# Patient Record
Sex: Female | Born: 1935 | Race: White | Hispanic: No | Smoking: Never smoker
Health system: Southern US, Community
[De-identification: ages and names within clinical notes are randomized; demographics above are authoritative.]

## PROBLEM LIST (undated history)

## (undated) DIAGNOSIS — K5900 Constipation, unspecified: Secondary | ICD-10-CM

## (undated) DIAGNOSIS — R1310 Dysphagia, unspecified: Secondary | ICD-10-CM

## (undated) DIAGNOSIS — D649 Anemia, unspecified: Secondary | ICD-10-CM

## (undated) DIAGNOSIS — K222 Esophageal obstruction: Secondary | ICD-10-CM

## (undated) DIAGNOSIS — K219 Gastro-esophageal reflux disease without esophagitis: Secondary | ICD-10-CM

## (undated) DIAGNOSIS — M1990 Unspecified osteoarthritis, unspecified site: Secondary | ICD-10-CM

## (undated) HISTORY — PX: APPENDECTOMY: SHX54

## (undated) HISTORY — DX: Esophageal obstruction: K22.2

## (undated) HISTORY — DX: Gastro-esophageal reflux disease without esophagitis: K21.9

## (undated) HISTORY — PX: OTHER SURGICAL HISTORY: SHX169

## (undated) HISTORY — PX: COLONOSCOPY: SHX174

## (undated) HISTORY — PX: CHOLECYSTECTOMY: SHX55

## (undated) HISTORY — DX: Constipation, unspecified: K59.00

## (undated) HISTORY — PX: ABDOMINAL HYSTERECTOMY: SHX81

## (undated) HISTORY — DX: Dysphagia, unspecified: R13.10

## (undated) SURGERY — EGD (ESOPHAGOGASTRODUODENOSCOPY)
Anesthesia: Moderate Sedation

---

## 2004-08-12 ENCOUNTER — Ambulatory Visit: Payer: Self-pay | Admitting: Internal Medicine

## 2004-08-12 ENCOUNTER — Ambulatory Visit (HOSPITAL_COMMUNITY): Admission: RE | Admit: 2004-08-12 | Discharge: 2004-08-12 | Payer: Self-pay | Admitting: Internal Medicine

## 2004-12-13 ENCOUNTER — Ambulatory Visit: Payer: Self-pay | Admitting: Internal Medicine

## 2005-08-18 ENCOUNTER — Ambulatory Visit: Payer: Self-pay | Admitting: Internal Medicine

## 2006-09-07 ENCOUNTER — Ambulatory Visit: Payer: Self-pay | Admitting: Internal Medicine

## 2010-04-29 ENCOUNTER — Ambulatory Visit: Payer: Self-pay | Admitting: Internal Medicine

## 2010-04-29 ENCOUNTER — Ambulatory Visit (HOSPITAL_COMMUNITY)
Admission: RE | Admit: 2010-04-29 | Discharge: 2010-04-29 | Payer: Self-pay | Source: Home / Self Care | Attending: Internal Medicine | Admitting: Internal Medicine

## 2010-07-19 ENCOUNTER — Ambulatory Visit (INDEPENDENT_AMBULATORY_CARE_PROVIDER_SITE_OTHER): Payer: Self-pay | Admitting: Internal Medicine

## 2010-10-08 NOTE — Op Note (Signed)
NAME:  Valerie Griffin, Valerie Griffin              ACCOUNT NO.:  [id number]   MEDICAL RECORD NO.:  [id number]          PATIENT TYPE:  AMB   LOCATION:  DAY                           FACILITY:  [facility]   PHYSICIAN:  Lionel December, M.D.    DATE OF BIRTH:  05/21/1934   DATE OF PROCEDURE:  08/12/2004  DATE OF DISCHARGE:                                 OPERATIVE REPORT   PROCEDURE:  Esophagogastroduodenoscopy with esophageal dilation.   PROCEDURE:  Esophagogastroduodenoscopy.   INDICATIONS:  Valerie Griffin is a 77 year old Caucasian female who presented in  July 2004 with GI bleeding.  Colonoscopy was normal.  She had EGD by Dr.  Loura Back.  It revealed esophageal ulcers.  At that time she had been on  Fosamax.  Biopsy from the distal esophagus revealed Barrett's mucosa.  She  was treated with PPI, and she returned for a repeat EGD in April 2005 by  him.  This time the noted to minimal esophagitis, small sliding hiatal  hernia, but no definite Barrett's was seen.  Biopsies from distal esophagus  this time were negative for Barrett's.  He therefore referred the patient  for further evaluation.  She is on Prevacid along with antireflux measures, and she has rare or  infrequent heartburn.  She denies dysphagia or melena.  The procedure risks  were reviewed the patient and informed consent was obtained.   PREMEDICATION:  Cetacaine spray for pharyngeal topical anesthesia, Demerol  50 mg IV, Versed 5 mg IV.   FINDINGS:  Procedure performed in endoscopy suite.  The patient's vital  signs and O2 saturation were monitored during procedure and remained stable.  The patient was placed in the left lateral position and Olympus video scope  was passed via oropharynx without any difficulty into esophagus.   Esophagus:  Mucosa of the proximal and middle segment was normal.  Distally  she had two linear erosions close to GE junction.  The GE junction was  located at 35.  She had asymmetric large patch of Barrett's mucosa which  was  posteriorly extending for about 15 mm.  It did not involve the circumference  completely.  There was an ulcer at the junction of Barrett's and squamous  mucosa at 2 o'clock.  This was a few millimeters.  Hiatus was at 40 cm.   Stomach:  It was empty and distended very well with insufflation.  Folds of  the proximal stomach were normal.  Examination of mucosa at body, antrum,  pyloric channel as well as angularis, fundus and cardia was normal.  Hernia  was easily seen on this view.   Duodenum:  Bulbar mucosa was normal.  The scope was passed second part of  duodenum, where mucosa and folds were normal.   Endoscope was pulled back into the esophagus.  Biopsy was taken from this  ulcer and from the Barrett's mucosa and submitted in two separate  containers.  Endoscope was withdrawn.  The patient tolerated the procedure  well.   FINAL DIAGNOSES:  1.  Erosive/ulcerative esophagitis.  She has a single ulcer at junction of      Barrett's  and squamous epithelium.  Short-segment Barrett's esophagus      with asymmetric involvement of distal esophagus.  The maximal length is      about 15 mm.  2.  Small to moderate size sliding hiatal hernia.  3.  Normal exam of the stomach, first and second part of the duodenum.   RECOMMENDATIONS:  1.  She will continue entire reflux measures.  2.  Will increase Prevacid 30 mg p.o. b.i.d., new prescription written for      one month with three refills.  3.  I will be contacting the patient with biopsy results. She will be      returning for OV in four months from now.      NR/MEDQ  D:  08/12/2004  T:  08/12/2004  Job:  [id number]   cc:   Doreen Beam  [address]  [place]  [place] [address]  Fax: [phone]   Loura Back, M.D.  [place], [place]

## 2010-12-20 ENCOUNTER — Other Ambulatory Visit (INDEPENDENT_AMBULATORY_CARE_PROVIDER_SITE_OTHER): Payer: Self-pay | Admitting: *Deleted

## 2010-12-20 DIAGNOSIS — K219 Gastro-esophageal reflux disease without esophagitis: Secondary | ICD-10-CM

## 2010-12-20 NOTE — Telephone Encounter (Signed)
Refill request via fax

## 2010-12-21 MED ORDER — LANSOPRAZOLE 30 MG PO CPDR: DELAYED_RELEASE_CAPSULE | ORAL | Status: DC

## 2016-06-28 ENCOUNTER — Encounter: Payer: Self-pay | Admitting: Internal Medicine

## 2016-07-27 ENCOUNTER — Encounter (INDEPENDENT_AMBULATORY_CARE_PROVIDER_SITE_OTHER): Payer: Self-pay | Admitting: Internal Medicine

## 2016-07-27 ENCOUNTER — Encounter (INDEPENDENT_AMBULATORY_CARE_PROVIDER_SITE_OTHER): Payer: Self-pay

## 2016-08-10 ENCOUNTER — Ambulatory Visit (INDEPENDENT_AMBULATORY_CARE_PROVIDER_SITE_OTHER): Payer: Self-pay | Admitting: Internal Medicine

## 2016-08-16 ENCOUNTER — Encounter (INDEPENDENT_AMBULATORY_CARE_PROVIDER_SITE_OTHER): Payer: Self-pay | Admitting: *Deleted

## 2016-08-16 ENCOUNTER — Ambulatory Visit (INDEPENDENT_AMBULATORY_CARE_PROVIDER_SITE_OTHER): Payer: Medicare HMO | Admitting: Internal Medicine

## 2016-08-16 ENCOUNTER — Encounter (INDEPENDENT_AMBULATORY_CARE_PROVIDER_SITE_OTHER): Payer: Self-pay | Admitting: Internal Medicine

## 2016-08-16 DIAGNOSIS — R1319 Other dysphagia: Secondary | ICD-10-CM

## 2016-08-16 DIAGNOSIS — R1310 Dysphagia, unspecified: Secondary | ICD-10-CM | POA: Diagnosis not present

## 2016-08-16 DIAGNOSIS — K219 Gastro-esophageal reflux disease without esophagitis: Secondary | ICD-10-CM

## 2016-08-16 DIAGNOSIS — K5900 Constipation, unspecified: Secondary | ICD-10-CM | POA: Insufficient documentation

## 2016-08-16 HISTORY — DX: Dysphagia, unspecified: R13.10

## 2016-08-16 NOTE — Progress Notes (Signed)
   Subjective:    Patient ID: Valerie Griffin, female    DOB: 27-Feb-1934, 83 y.o.   MRN: [id number]  HPI  Referred by Dr. Woody Seller for dysphagia. Hx of same and has undergone multiple EGD/ED in the past. Last EGDwas in July of 2017 by Dr. Britta Mccreedy.  Hx of esophageal stricture.  She tells me she is still having trouble. Dysphagia occurs usually on a daily basis. Anything with texture does not want to go down. Chicken and beef will lodge.  Acid reflux is controlled with Prevacid. She has a BM once a day with a stool softener.  September 2017: EGD/ED Short stricture in the distal esophagus. Dilated up to 65mm.  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 Duodenal and stomach mucosa appeared normal.  December 09, 2015 Dr. Britta Mccreedy: Class D esophagitis and an esophageal stricture. Biopsies were consistent with ulcerative esophagitis. .  December 2015 Dr. Britta Mccreedy: dysphagia: LA C esophagitis and a short stricture of the distal esophagus along with a hiatal hernia.  Review of Systems Past Medical History:  Diagnosis Date  . Dysphagia 08/16/2016    No past surgical history on file.  Allergies  Allergen Reactions  . Penicillins     Rash, hot feeling    Current Outpatient Prescriptions on File Prior to Visit  Medication Sig Dispense Refill  . lansoprazole (PREVACID) 30 MG capsule Take One Capsule by Mouth Twice a Day 60 capsule 11   No current facility-administered medications on file prior to visit.        Objective:   Physical Exam Blood pressure (!) 150/66, pulse 72, height 5\' 5"  (1.651 m), weight 181 lb 8 oz (82.3 kg).  Alert and oriented. Skin warm and dry. Oral mucosa is moist.   . Sclera anicteric, conjunctivae is pink. Thyroid not enlarged. No cervical lymphadenopathy. Lungs clear. Heart regular rate and rhythm.  Abdomen is soft. Bowel sounds are positive. No hepatomegaly. No abdominal masses felt. No tenderness.  No edema to lower extremities.        Assessment & Plan:    Dysphagia. Am going to get an esophagram.  Further recommendations ot follow.

## 2016-08-16 NOTE — Patient Instructions (Signed)
DG esophagram.   

## 2016-08-18 ENCOUNTER — Encounter (HOSPITAL_COMMUNITY): Payer: Self-pay

## 2016-08-18 ENCOUNTER — Ambulatory Visit (HOSPITAL_COMMUNITY)
Admission: RE | Admit: 2016-08-18 | Discharge: 2016-08-18 | Disposition: A | Discharge status: Home / Self Care | Payer: Medicare HMO | Source: Ambulatory Visit | Attending: Internal Medicine | Admitting: Internal Medicine

## 2016-08-18 ENCOUNTER — Other Ambulatory Visit (INDEPENDENT_AMBULATORY_CARE_PROVIDER_SITE_OTHER): Payer: Self-pay | Admitting: Internal Medicine

## 2016-08-18 ENCOUNTER — Telehealth (INDEPENDENT_AMBULATORY_CARE_PROVIDER_SITE_OTHER): Payer: Self-pay | Admitting: Internal Medicine

## 2016-08-18 DIAGNOSIS — R1310 Dysphagia, unspecified: Secondary | ICD-10-CM | POA: Diagnosis present

## 2016-08-18 DIAGNOSIS — R933 Abnormal findings on diagnostic imaging of other parts of digestive tract: Secondary | ICD-10-CM | POA: Insufficient documentation

## 2016-08-18 DIAGNOSIS — K222 Esophageal obstruction: Secondary | ICD-10-CM | POA: Insufficient documentation

## 2016-08-18 DIAGNOSIS — R1319 Other dysphagia: Secondary | ICD-10-CM

## 2016-08-18 DIAGNOSIS — K225 Diverticulum of esophagus, acquired: Secondary | ICD-10-CM | POA: Diagnosis not present

## 2016-08-18 DIAGNOSIS — K449 Diaphragmatic hernia without obstruction or gangrene: Secondary | ICD-10-CM | POA: Diagnosis not present

## 2016-08-18 NOTE — Telephone Encounter (Signed)
EGD/ED sch'd 08/22/16, patient aware, verbal instructions given

## 2016-08-18 NOTE — Telephone Encounter (Signed)
EGD/ED

## 2016-08-22 ENCOUNTER — Encounter (HOSPITAL_COMMUNITY)
Admission: RE | Disposition: A | Discharge status: Home / Self Care | Payer: Self-pay | Source: Ambulatory Visit | Attending: Internal Medicine

## 2016-08-22 ENCOUNTER — Ambulatory Visit (HOSPITAL_COMMUNITY)
Admission: RE | Admit: 2016-08-22 | Discharge: 2016-08-22 | Disposition: A | Discharge status: Home / Self Care | Payer: Medicare HMO | Source: Ambulatory Visit | Attending: Internal Medicine | Admitting: Internal Medicine

## 2016-08-22 ENCOUNTER — Encounter (HOSPITAL_COMMUNITY): Payer: Self-pay

## 2016-08-22 DIAGNOSIS — K449 Diaphragmatic hernia without obstruction or gangrene: Secondary | ICD-10-CM | POA: Diagnosis not present

## 2016-08-22 DIAGNOSIS — K222 Esophageal obstruction: Secondary | ICD-10-CM | POA: Diagnosis not present

## 2016-08-22 DIAGNOSIS — K219 Gastro-esophageal reflux disease without esophagitis: Secondary | ICD-10-CM | POA: Diagnosis not present

## 2016-08-22 DIAGNOSIS — R1310 Dysphagia, unspecified: Secondary | ICD-10-CM | POA: Diagnosis present

## 2016-08-22 DIAGNOSIS — K225 Diverticulum of esophagus, acquired: Secondary | ICD-10-CM | POA: Insufficient documentation

## 2016-08-22 DIAGNOSIS — Z79899 Other long term (current) drug therapy: Secondary | ICD-10-CM | POA: Diagnosis not present

## 2016-08-22 DIAGNOSIS — R1319 Other dysphagia: Secondary | ICD-10-CM | POA: Insufficient documentation

## 2016-08-22 DIAGNOSIS — Z9049 Acquired absence of other specified parts of digestive tract: Secondary | ICD-10-CM | POA: Diagnosis not present

## 2016-08-22 DIAGNOSIS — Z7982 Long term (current) use of aspirin: Secondary | ICD-10-CM | POA: Insufficient documentation

## 2016-08-22 DIAGNOSIS — K2210 Ulcer of esophagus without bleeding: Secondary | ICD-10-CM | POA: Insufficient documentation

## 2016-08-22 DIAGNOSIS — R933 Abnormal findings on diagnostic imaging of other parts of digestive tract: Secondary | ICD-10-CM

## 2016-08-22 DIAGNOSIS — Q396 Congenital diverticulum of esophagus: Secondary | ICD-10-CM | POA: Diagnosis not present

## 2016-08-22 DIAGNOSIS — R1314 Dysphagia, pharyngoesophageal phase: Secondary | ICD-10-CM | POA: Diagnosis not present

## 2016-08-22 HISTORY — PX: ESOPHAGEAL DILATION: SHX303

## 2016-08-22 HISTORY — PX: ESOPHAGOGASTRODUODENOSCOPY: SHX5428

## 2016-08-22 SURGERY — EGD (ESOPHAGOGASTRODUODENOSCOPY)
Anesthesia: Moderate Sedation

## 2016-08-22 MED ORDER — STERILE WATER FOR IRRIGATION IR SOLN
Status: DC | PRN
  Administered 2016-08-22: 08:00:00

## 2016-08-22 MED ORDER — MEPERIDINE HCL 50 MG/ML IJ SOLN
INTRAMUSCULAR | Status: DC | PRN
  Administered 2016-08-22 (×2): 25 mg via INTRAVENOUS

## 2016-08-22 MED ORDER — SODIUM CHLORIDE 0.9 % IV SOLN
INTRAVENOUS | Status: DC
  Administered 2016-08-22: 08:00:00 via INTRAVENOUS

## 2016-08-22 MED ORDER — MEPERIDINE HCL 50 MG/ML IJ SOLN
INTRAMUSCULAR | Status: AC
  Filled 2016-08-22: qty 1

## 2016-08-22 MED ORDER — LIDOCAINE VISCOUS 2 % MT SOLN
OROMUCOSAL | Status: DC | PRN
  Administered 2016-08-22: 3 mL via OROMUCOSAL

## 2016-08-22 MED ORDER — LANSOPRAZOLE 30 MG PO CPDR: 30.0000 mg | DELAYED_RELEASE_CAPSULE | Freq: Two times a day (BID) | ORAL | 5 refills | Status: DC

## 2016-08-22 MED ORDER — METOCLOPRAMIDE HCL 5 MG PO TABS: 5.0000 mg | ORAL_TABLET | Freq: Two times a day (BID) | ORAL | 1 refills | Status: DC

## 2016-08-22 MED ORDER — MIDAZOLAM HCL 5 MG/5ML IJ SOLN
INTRAMUSCULAR | Status: DC | PRN
  Administered 2016-08-22: 1 mg via INTRAVENOUS
  Administered 2016-08-22: 2 mg via INTRAVENOUS
  Administered 2016-08-22: 1 mg via INTRAVENOUS

## 2016-08-22 MED ORDER — LIDOCAINE VISCOUS 2 % MT SOLN
OROMUCOSAL | Status: AC
  Filled 2016-08-22: qty 15

## 2016-08-22 MED ORDER — MIDAZOLAM HCL 5 MG/5ML IJ SOLN
INTRAMUSCULAR | Status: AC
  Filled 2016-08-22: qty 10

## 2016-08-22 NOTE — Op Note (Signed)
[facility] Patient Name: Valerie Griffin Procedure Date: 08/22/2016 7:46 AM MRN: [id number] Date of Birth: [birth year] Attending MD: Hildred Laser , MD CSN: [id number] Age: 83 Admit Type: Ambulatory Procedure:                Upper GI endoscopy Indications:              Esophageal dysphagia, Gastro-esophageal reflux                            disease Providers:                Hildred Laser, MD, Otis Peak B. Sharon Seller, RN, Rosina Lowenstein, RN Referring MD:             Glenda Chroman, MD Medicines:                Lidocaine spray, Meperidine 50 mg IV, Midazolam 4                            mg IV Complications:            No immediate complications. Estimated Blood Loss:     Estimated blood loss was minimal. Procedure:                Pre-Anesthesia Assessment:                           - Prior to the procedure, a History and Physical                            was performed, and patient medications and                            allergies were reviewed. The patient's tolerance of                            previous anesthesia was also reviewed. The risks                            and benefits of the procedure and the sedation                            options and risks were discussed with the patient.                            All questions were answered, and informed consent                            was obtained. Prior Anticoagulants: The patient                            last took aspirin 3 days prior to the procedure.  ASA Grade Assessment: II - A patient with mild                            systemic disease. After reviewing the risks and                            benefits, the patient was deemed in satisfactory                            condition to undergo the procedure.                           After obtaining informed consent, the endoscope was                            passed under direct vision. Throughout the                  procedure, the patient's blood pressure, pulse, and                            oxygen saturations were monitored continuously. The                            [id number]) scope was introduced through the                            and advanced to the second part of duodenum. The                            upper GI endoscopy was accomplished without                            difficulty. The patient tolerated the procedure                            well. Scope In: 8:15:52 AM Scope Out: 8:28:15 AM Total Procedure Duration: 0 hours 12 minutes 23 seconds  Findings:      The proximal esophagus was normal.      Three linear esophageal ulcers with no bleeding and no stigmata of       recent bleeding were found 23 to 30 cm from the incisors.      Pseudo-diverticula with a small opening and no stigmata of recent       bleeding was found in the distal esophagus secondary to scarring.      One severe benign-appearing, intrinsic stenosis was found 30 cm from the       incisors. This measured 9 mm (inner diameter) x less than one cm (in       length) and was traversed after dilation. A TTS dilator was passed       through the scope. Dilation with a 03-03-11 mm balloon dilator was       performed to 12 mm, 13.5 mm and 15 mm. The dilation site was examined       following endoscope reinsertion and showed moderate improvement in       luminal narrowing.  A 7 cm hiatal hernia was present.      The entire examined stomach was normal.      The duodenal bulb and second portion of the duodenum were normal. Impression:               - Normal proximal esophagus.                           - Non-bleeding esophageal ulcers. Extensive                            ulceration involving 7 cm of esophageal mucos down                            to GE junction.                           - Diverticulum in the distal esophagus.                           - Benign-appearing esophageal stenosis.  Dilated.                           - 7 cm hiatal hernia.                           - Normal stomach.                           - Normal duodenal bulb and second portion of the                            duodenum.                           - No specimens collected. Moderate Sedation:      Moderate (conscious) sedation was administered by the endoscopy nurse       and supervised by the endoscopist. The following parameters were       monitored: oxygen saturation, heart rate, blood pressure, CO2       capnography and response to care. Total physician intraservice time was       19 minutes. Recommendation:           - Patient has a contact number available for                            emergencies. The signs and symptoms of potential                            delayed complications were discussed with the                            patient. Return to normal activities tomorrow.                            Written discharge instructions were provided to the  patient.                           - Resume previous diet today.                           - Continue present medications.                           - Increase lansoprazole to 30 mg by mouth to bid.                           - Metoclopramide 5 mg by mouth before evening meal                            and bedtime daily.                           - Resume aspirin on 08/25/2016.                           - Repeat upper endoscopy in 4 weeks. Procedure Code(s):        --- Professional ---                           [phone], Esophagogastroduodenoscopy, flexible,                            transoral; with transendoscopic balloon dilation of                            esophagus (less than 30 mm diameter)                           99152, Moderate sedation services provided by the                            same physician or other qualified health care                            professional performing the diagnostic or                             therapeutic service that the sedation supports,                            requiring the presence of an independent trained                            observer to assist in the monitoring of the                            patient's level of consciousness and physiological                            status; initial 15 minutes  of intraservice time,                            patient age 35 years or older Diagnosis Code(s):        --- Professional ---                           K22.10, Ulcer of esophagus without bleeding                           Q39.6, Congenital diverticulum of esophagus                           K22.2, Esophageal obstruction                           K44.9, Diaphragmatic hernia without obstruction or                            gangrene                           R13.14, Dysphagia, pharyngoesophageal phase                           K21.9, Gastro-esophageal reflux disease without                            esophagitis CPT copyright 2016 American Medical Association. All rights reserved. The codes documented in this report are preliminary and upon coder review may  be revised to meet current compliance requirements. Hildred Laser, MD Hildred Laser, MD 08/22/2016 8:49:00 AM This report has been signed electronically. Number of Addenda: 0

## 2016-08-22 NOTE — H&P (Signed)
Valerie Griffin is an 83 y.o. female.   Chief Complaint: Patient is here for EGD and ED. HPI: Patient is 83 year old Caucasian female was chronic GERD complicated by Barrett's esophagus and esophageal stricture who presents with solid food dysphagia of few months duration. She had her esophagus dilated wise last year at [facility] by Dr. Britta Mccreedy. Last EGD was in September 2017 and stricture was dilated to 15 mm because of presence of esophagitis. Patient states heartburns well controlled with therapy. She states she lost 40 pounds over. Over a period of 5 or 6 years but weight has been stable over the last year. She denies nausea vomiting abdominal pain melena or rectal bleeding.  Past Medical History:  Diagnosis Date  . Constipation   . Dysphagia 08/16/2016  . GERD (gastroesophageal reflux disease)     Past Surgical History:  Procedure Laterality Date  . APPENDECTOMY    . broken arm and wrist with plate and scews rt wrist    . CHOLECYSTECTOMY    . complete hysterectomy'     fibroid tumors bleeding    History reviewed. No pertinent family history. Social History:  reports that she has never smoked. She has never used smokeless tobacco. She reports that she does not use drugs. Her alcohol history is not on file.  Allergies:  Allergies  Allergen Reactions  . Penicillins     Rash, hot feeling    Medications Prior to Admission  Medication Sig Dispense Refill  . aspirin EC 81 MG tablet Take 81 mg by mouth daily.    . Calcium Carb-Cholecalciferol [phone] MG-UNIT CAPS Take 1 tablet by mouth daily.     . cholecalciferol (VITAMIN D) 400 units TABS tablet Take 2,000 Units by mouth daily.    Marland Kitchen docusate sodium (COLACE) 100 MG capsule Take 100 mg by mouth daily.    . Flaxseed, Linseed, (FLAXSEED OIL) 1200 MG CAPS Take 1 capsule by mouth daily.     . lansoprazole (PREVACID) 30 MG capsule Take 30 mg by mouth daily.    . Multiple Vitamin (MULTIVITAMIN) tablet Take 1 tablet by mouth daily.    .  Omega-3 Fatty Acids (FISH OIL) 1000 MG CAPS Take 1 capsule by mouth daily.       No results found for this or any previous visit (from the past 48 hour(s)). No results found.  ROS  Blood pressure (!) 166/63, pulse 69, temperature 97.7 F (36.5 C), temperature source Oral, resp. rate 14, SpO2 99 %. Physical Exam  Constitutional: She appears well-developed and well-nourished.  HENT:  Mouth/Throat: Oropharynx is clear and moist.  Eyes: Conjunctivae are normal. No scleral icterus.  Neck: No thyromegaly present.  Cardiovascular: Normal rate, regular rhythm and normal heart sounds.   No murmur heard. Respiratory: Effort normal and breath sounds normal.  GI: Soft. She exhibits no distension and no mass. There is no tenderness.  Musculoskeletal: She exhibits no edema.  Lymphadenopathy:    She has no cervical adenopathy.  Neurological: She is alert.  Skin: Skin is warm and dry.     Assessment/Plan Esophageal dysphagia in a patient with known chronic GERD with Barrett's and esophageal stricture. EGD with ED.  Hildred Laser, MD 08/22/2016, 8:04 AM

## 2016-08-22 NOTE — Discharge Instructions (Signed)
Resume aspirin on 08/25/2016. Increase lansoprazole to 30 mg by mouth 30 minutes before breakfast and evening meal daily. Metoclopramide 5 mg by mouth 30 minutes before evening meal and at bedtime. If you experience any side effects with this medication stop it and call office. Resume other medications and diet as before. Keep HOB at 30 at all times. No driving for 24 hours. Repeat dilation in 4 weeks.   Esophageal Dilatation Esophageal dilatation is a procedure to open a blocked or narrowed part of the esophagus. The esophagus is the long tube in your throat that carries food and liquid from your mouth to your stomach. The procedure is also called esophageal dilation. You may need this procedure if you have a buildup of scar tissue in your esophagus that makes it difficult, painful, or even impossible to swallow. This can be caused by gastroesophageal reflux disease (GERD). In rare cases, people need this procedure because they have cancer of the esophagus or a problem with the way food moves through the esophagus. Sometimes you may need to have another dilatation to enlarge the opening of the esophagus gradually. Tell a health care provider about:  Any allergies you have.  All medicines you are taking, including vitamins, herbs, eye drops, creams, and over-the-counter medicines.  Any problems you or family members have had with anesthetic medicines.  Any blood disorders you have.  Any surgeries you have had.  Any medical conditions you have.  Any antibiotic medicines you are required to take before dental procedures. What are the risks? Generally, this is a safe procedure. However, problems can occur and include:  Bleeding from a tear in the lining of the esophagus.  A hole (perforation) in the esophagus. What happens before the procedure?  Do not eat or drink anything after midnight on the night before the procedure or as directed by your health care provider.  Ask your  health care provider about changing or stopping your regular medicines. This is especially important if you are taking diabetes medicines or blood thinners.  Plan to have someone take you home after the procedure. What happens during the procedure?  You will be given a medicine that makes you relaxed and sleepy (sedative).  A medicine may be sprayed or gargled to numb the back of the throat.  Your health care provider can use various instruments to do an esophageal dilatation. During the procedure, the instrument used will be placed in your mouth and passed down into your esophagus. Options include:  Simple dilators. This instrument is carefully placed in the esophagus to stretch it.  Guided wire bougies. In this method, a flexible tube (endoscope) is used to insert a wire into the esophagus. The dilator is passed over this wire to enlarge the esophagus. Then the wire is removed.  Balloon dilators. An endoscope with a small balloon at the end is passed down into the esophagus. Inflating the balloon gently stretches the esophagus and opens it up. What happens after the procedure?  Your blood pressure, heart rate, breathing rate, and blood oxygen level will be monitored often until the medicines you were given have worn off.  Your throat may feel slightly sore and will probably still feel numb. This will improve slowly over time.  You will not be allowed to eat or drink until the throat numbness has resolved.  If this is a same-day procedure, you may be allowed to go home once you have been able to drink, urinate, and sit on the edge of  the bed without nausea or dizziness.  If this is a same-day procedure, you should have a friend or family member with you for the next 24 hours after the procedure. This information is not intended to replace advice given to you by your health care provider. Make sure you discuss any questions you have with your health care provider. Document Released:  06/30/2005 Document Revised: 10/15/2015 Document Reviewed: 09/18/2013 Elsevier Interactive Patient Education  2017 [employer].

## 2016-08-29 ENCOUNTER — Encounter (HOSPITAL_COMMUNITY): Payer: Self-pay | Admitting: Internal Medicine

## 2016-08-29 ENCOUNTER — Telehealth (INDEPENDENT_AMBULATORY_CARE_PROVIDER_SITE_OTHER): Payer: Self-pay | Admitting: *Deleted

## 2016-08-29 NOTE — Telephone Encounter (Signed)
Rec'd from the Pharmacy that the Insurance only will pay for 1 a day Lansoprazole. Per Dr.Rehman the patient may take 1 per day and she can use  OTC Pepcid as needed.  I have tried to reach the patient ,I left her a message but I did call the patient's Pharmacy [facility] in [place] to tell them.

## 2016-08-30 ENCOUNTER — Other Ambulatory Visit (INDEPENDENT_AMBULATORY_CARE_PROVIDER_SITE_OTHER): Payer: Self-pay | Admitting: *Deleted

## 2016-08-30 ENCOUNTER — Encounter (INDEPENDENT_AMBULATORY_CARE_PROVIDER_SITE_OTHER): Payer: Self-pay | Admitting: *Deleted

## 2016-08-30 DIAGNOSIS — R1310 Dysphagia, unspecified: Secondary | ICD-10-CM

## 2016-09-14 ENCOUNTER — Encounter (HOSPITAL_COMMUNITY): Payer: Self-pay | Admitting: *Deleted

## 2016-09-14 ENCOUNTER — Encounter (HOSPITAL_COMMUNITY)
Admission: RE | Disposition: A | Discharge status: Home / Self Care | Payer: Self-pay | Source: Ambulatory Visit | Attending: Internal Medicine

## 2016-09-14 ENCOUNTER — Ambulatory Visit (HOSPITAL_COMMUNITY)
Admission: RE | Admit: 2016-09-14 | Discharge: 2016-09-14 | Disposition: A | Discharge status: Home / Self Care | Payer: Medicare HMO | Source: Ambulatory Visit | Attending: Internal Medicine | Admitting: Internal Medicine

## 2016-09-14 DIAGNOSIS — K228 Other specified diseases of esophagus: Secondary | ICD-10-CM | POA: Insufficient documentation

## 2016-09-14 DIAGNOSIS — K222 Esophageal obstruction: Secondary | ICD-10-CM | POA: Insufficient documentation

## 2016-09-14 DIAGNOSIS — Z880 Allergy status to penicillin: Secondary | ICD-10-CM | POA: Diagnosis not present

## 2016-09-14 DIAGNOSIS — Z7982 Long term (current) use of aspirin: Secondary | ICD-10-CM | POA: Insufficient documentation

## 2016-09-14 DIAGNOSIS — R1310 Dysphagia, unspecified: Secondary | ICD-10-CM

## 2016-09-14 DIAGNOSIS — K449 Diaphragmatic hernia without obstruction or gangrene: Secondary | ICD-10-CM | POA: Insufficient documentation

## 2016-09-14 DIAGNOSIS — K210 Gastro-esophageal reflux disease with esophagitis: Secondary | ICD-10-CM | POA: Insufficient documentation

## 2016-09-14 DIAGNOSIS — K2210 Ulcer of esophagus without bleeding: Secondary | ICD-10-CM | POA: Diagnosis not present

## 2016-09-14 DIAGNOSIS — R1314 Dysphagia, pharyngoesophageal phase: Secondary | ICD-10-CM | POA: Diagnosis not present

## 2016-09-14 DIAGNOSIS — R933 Abnormal findings on diagnostic imaging of other parts of digestive tract: Secondary | ICD-10-CM | POA: Diagnosis not present

## 2016-09-14 DIAGNOSIS — Z9049 Acquired absence of other specified parts of digestive tract: Secondary | ICD-10-CM | POA: Diagnosis not present

## 2016-09-14 DIAGNOSIS — Z79899 Other long term (current) drug therapy: Secondary | ICD-10-CM | POA: Diagnosis not present

## 2016-09-14 HISTORY — PX: ESOPHAGEAL DILATION: SHX303

## 2016-09-14 HISTORY — PX: ESOPHAGOGASTRODUODENOSCOPY: SHX5428

## 2016-09-14 SURGERY — EGD (ESOPHAGOGASTRODUODENOSCOPY)
Anesthesia: Moderate Sedation

## 2016-09-14 MED ORDER — MEPERIDINE HCL 50 MG/ML IJ SOLN
INTRAMUSCULAR | Status: DC | PRN
  Administered 2016-09-14 (×2): 25 mg via INTRAVENOUS

## 2016-09-14 MED ORDER — STERILE WATER FOR IRRIGATION IR SOLN
Status: DC | PRN
  Administered 2016-09-14: 13:00:00

## 2016-09-14 MED ORDER — SODIUM CHLORIDE 0.9 % IV SOLN
INTRAVENOUS | Status: DC
  Administered 2016-09-14: 1000 mL via INTRAVENOUS

## 2016-09-14 MED ORDER — MEPERIDINE HCL 50 MG/ML IJ SOLN
INTRAMUSCULAR | Status: AC
  Filled 2016-09-14: qty 1

## 2016-09-14 MED ORDER — MIDAZOLAM HCL 5 MG/5ML IJ SOLN
INTRAMUSCULAR | Status: AC
  Filled 2016-09-14: qty 10

## 2016-09-14 MED ORDER — MIDAZOLAM HCL 5 MG/5ML IJ SOLN
INTRAMUSCULAR | Status: DC | PRN
  Administered 2016-09-14: 2 mg via INTRAVENOUS
  Administered 2016-09-14: 1 mg via INTRAVENOUS
  Administered 2016-09-14: 2 mg via INTRAVENOUS

## 2016-09-14 MED ORDER — LIDOCAINE VISCOUS 2 % MT SOLN
OROMUCOSAL | Status: DC | PRN
  Administered 2016-09-14: 3 mL via OROMUCOSAL

## 2016-09-14 MED ORDER — LIDOCAINE VISCOUS 2 % MT SOLN
OROMUCOSAL | Status: DC
Start: 2016-09-14 — End: 2016-09-14
  Filled 2016-09-14: qty 15

## 2016-09-14 NOTE — Op Note (Signed)
[facility] Patient Name: Valerie Griffin Procedure Date: 09/14/2016 12:33 PM MRN: [id number] Date of Birth: Apr 18, 1934 Attending MD: Hildred Laser , MD CSN: [id number] Age: 83 Admit Type: Outpatient Procedure:                Upper GI endoscopy Indications:              Esophageal dysphagia, For therapy of esophageal                            stricture Providers:                Hildred Laser, MD, Otis Peak B. Sharon Seller, RN, Aram Candela Referring MD:             Glenda Chroman, MD Medicines:                Lidocaine spray, Meperidine 50 mg IV, Midazolam 5                            mg IV Complications:            No immediate complications. Estimated Blood Loss:     Estimated blood loss was minimal. Procedure:                Pre-Anesthesia Assessment:                           - Prior to the procedure, a History and Physical                            was performed, and patient medications and                            allergies were reviewed. The patient's tolerance of                            previous anesthesia was also reviewed. The risks                            and benefits of the procedure and the sedation                            options and risks were discussed with the patient.                            All questions were answered, and informed consent                            was obtained. ASA Grade Assessment: II - A patient                            with mild systemic disease. After reviewing the  risks and benefits, the patient was deemed in                            satisfactory condition to undergo the procedure.                           After obtaining informed consent, the endoscope was                            passed under direct vision. Throughout the                            procedure, the patient's blood pressure, pulse, and                            oxygen saturations were monitored  continuously. The                            EG-[id number] ([id number]) scope was introduced through the                            mouth, and advanced to the second part of duodenum.                            The upper GI endoscopy was accomplished without                            difficulty. The patient tolerated the procedure                            well. Scope In: 1:09:07 PM Scope Out: 1:19:12 PM Total Procedure Duration: 0 hours 10 minutes 5 seconds  Findings:      The proximal esophagus was normal.      Three linear esophageal ulcers with no bleeding were found 24 to 30 cm       from the incisors. The largest lesion was eight mm by thirty mm in       largest dimension. pseudo diverticula at distal eso      One moderate benign-appearing, intrinsic stenosis was found 30 cm from       the incisors. This measured 1 cm (inner diameter) x less than one cm (in       length) and was traversed. A TTS dilator was passed through the scope.       Dilation with a 15-16.5-18 mm balloon dilator was performed to 15 mm and       16.5 mm. The dilation site was examined and showed moderate improvement       in luminal narrowing. mucosal disruption noted with minimal bleeding.      A 7 cm hiatal hernia was present.      The entire examined stomach was normal.      The duodenal bulb and second portion of the duodenum were normal. Impression:               - Normal proximal esophagus.                           -  Non-bleeding esophageal ulcers.                           - Benign-appearing esophageal stenosis. Dilated.                           - 7 cm hiatal hernia.                           - Normal stomach.                           - Normal duodenal bulb and second portion of the                            duodenum.                           - No specimens collected.                           Comment: Incomplete healing of esophagitis.                           Stricture at GE junction dilated to 16.5  mm. Moderate Sedation:      Moderate (conscious) sedation was administered by the endoscopy nurse       and supervised by the endoscopist. The following parameters were       monitored: oxygen saturation, heart rate, blood pressure, CO2       capnography and response to care. Total physician intraservice time was       16 minutes. Recommendation:           - Patient has a contact number available for                            emergencies. The signs and symptoms of potential                            delayed complications were discussed with the                            patient. Return to normal activities tomorrow.                            Written discharge instructions were provided to the                            patient.                           - Resume previous diet today.                           - Continue present medications.                           - No aspirin, ibuprofen, naproxen, or other  non-steroidal anti-inflammatory drugs for 3 days.                           - Return to GI clinic in 8 weeks. Procedure Code(s):        --- Professional ---                           [id number], Esophagogastroduodenoscopy, flexible,                            transoral; with transendoscopic balloon dilation of                            esophagus (less than 30 mm diameter)                           99152, Moderate sedation services provided by the                            same physician or other qualified health care                            professional performing the diagnostic or                            therapeutic service that the sedation supports,                            requiring the presence of an independent trained                            observer to assist in the monitoring of the                            patient's level of consciousness and physiological                            status; initial 15 minutes of intraservice time,                             patient age 59 years or older Diagnosis Code(s):        --- Professional ---                           K22.10, Ulcer of esophagus without bleeding                           K22.2, Esophageal obstruction                           K44.9, Diaphragmatic hernia without obstruction or                            gangrene  R13.14, Dysphagia, pharyngoesophageal phase CPT copyright 2016 American [employer] Association. All rights reserved. The codes documented in this report are preliminary and upon coder review may  be revised to meet current compliance requirements. Hildred Laser, MD Hildred Laser, MD 09/14/2016 1:32:46 PM This report has been signed electronically. Number of Addenda: 0

## 2016-09-14 NOTE — Discharge Instructions (Signed)
No aspirin for 3 days. Resume other medications and diet as before. No driving for 24 hours. Office visit in 2 months.   Upper Endoscopy, Care After Refer to this sheet in the next few weeks. These instructions provide you with information about caring for yourself after your procedure. Your health care provider may also give you more specific instructions. Your treatment has been planned according to current medical practices, but problems sometimes occur. Call your health care provider if you have any problems or questions after your procedure. What can I expect after the procedure? After the procedure, it is common to have:  A sore throat.  Bloating.  Nausea. Follow these instructions at home:  Follow instructions from your health care provider about what to eat or drink after your procedure.  Return to your normal activities as told by your health care provider. Ask your health care provider what activities are safe for you.  Take over-the-counter and prescription medicines only as told by your health care provider.  Do not drive for 24 hours if you received a sedative.  Keep all follow-up visits as told by your health care provider. This is important. Contact a health care provider if:  You have a sore throat that lasts longer than one day.  You have trouble swallowing. Get help right away if:  You have a fever.  You vomit blood or your vomit looks like coffee grounds.  You have bloody, black, or tarry stools.  You have a severe sore throat or you cannot swallow.  You have difficulty breathing.  You have severe pain in your chest or belly. This information is not intended to replace advice given to you by your health care provider. Make sure you discuss any questions you have with your health care provider. Document Released: 11/08/2011 Document Revised: 10/15/2015 Document Reviewed: 02/19/2015 Elsevier Interactive Patient Education  2017 [employer].

## 2016-09-14 NOTE — H&P (Signed)
Valerie Griffin is an 83 y.o. female.   Chief Complaint: Patient is here for EGD and ED. HPI: Patient is 83 year old Caucasian female was chronic GERD complicated by Barrett's esophagus who presented with dysphagia to solids. She had her esophageal stricture dilated in September 2017. EGD revealed extensive changes of esophagitis and high-grade stricture which was dilated to 13 mm. Patient has been maintained on double dose PPI and low-dose metoclopramide. She feels much better. She has difficulty with solids but needs. She says heartburn is well controlled. She denies melena or rectal bleeding. She states she was given 2 units of PRBCs earlier this month as her hemoglobin had dropped to 8 g. She believes her stool was guaiac negative.  Past Medical History:  Diagnosis Date  . Constipation   . Dysphagia 08/16/2016  . GERD (gastroesophageal reflux disease)     Past Surgical History:  Procedure Laterality Date  . ABDOMINAL HYSTERECTOMY    . APPENDECTOMY    . broken arm and wrist with plate and scews rt wrist    . CHOLECYSTECTOMY    . complete hysterectomy'     fibroid tumors bleeding  . ESOPHAGEAL DILATION N/A 08/22/2016   Procedure: ESOPHAGEAL DILATION;  Surgeon: Rogene Houston, MD;  [facility]: [facility];  Service: Endoscopy;  Laterality: N/A;  . ESOPHAGOGASTRODUODENOSCOPY N/A 08/22/2016   Procedure: ESOPHAGOGASTRODUODENOSCOPY (EGD);  Surgeon: Rogene Houston, MD;  Location: [facility];  Service: Endoscopy;  Laterality: N/A;    Family History  Problem Relation Age of Onset  . Heart Problems Mother   . Alzheimer's disease Mother   . Hypertension Mother   . Prostate cancer Father   . Heart attack Father   . Heart Problems Sister    Social History:  reports that she has never smoked. She has never used smokeless tobacco. She reports that she does not drink alcohol or use drugs.  Allergies:  Allergies  Allergen Reactions  . Penicillins Rash    40 years ago, caused rash and  tachycardia Has patient had a PCN reaction causing immediate rash, facial/tongue/throat swelling, SOB or lightheadedness with hypotension: No Has patient had a PCN reaction causing severe rash involving mucus membranes or skin necrosis: No Has patient had a PCN reaction that required hospitalization: No Has patient had a PCN reaction occurring within the last 10 years: No If all of the above answers are "NO", then may proceed with Cephalosporin use.     Medications Prior to Admission  Medication Sig Dispense Refill  . aspirin EC 81 MG tablet Take 1 tablet (81 mg total) by mouth daily.    . Calcium Carb-Cholecalciferol [phone] MG-UNIT CAPS Take 1 tablet by mouth daily.     . Cholecalciferol (VITAMIN D) 2000 units tablet Take 2,000 Units by mouth daily.    Marland Kitchen docusate sodium (COLACE) 100 MG capsule Take 200 mg by mouth at bedtime.     . Flaxseed, Linseed, (FLAXSEED OIL) 1200 MG CAPS Take 1 capsule by mouth 2 (two) times daily.     . lansoprazole (PREVACID) 30 MG capsule Take 1 capsule (30 mg total) by mouth 2 (two) times daily before a meal. 60 capsule 5  . metoCLOPramide (REGLAN) 5 MG tablet Take 1 tablet (5 mg total) by mouth 2 (two) times daily. 30 minutes before evening meal and at bedtime. 60 tablet 1  . Multiple Vitamin (MULTIVITAMIN) tablet Take 1 tablet by mouth daily.    . Omega-3 Fatty Acids (FISH OIL) 1000 MG CAPS Take 1,000  mg by mouth daily.       No results found for this or any previous visit (from the past 48 hour(s)). No results found.  ROS  Blood pressure (!) 157/55, pulse 66, temperature 97.6 F (36.4 C), temperature source Oral, resp. rate 19, height 5\' 5"  (1.651 m), weight 180 lb (81.6 kg), SpO2 99 %. Physical Exam  Constitutional: She appears well-developed and well-nourished.  HENT:  Mouth/Throat: Oropharynx is clear and moist.  Eyes: Conjunctivae are normal. No scleral icterus.  Neck: No thyromegaly present.  Cardiovascular: Normal rate, regular rhythm and  normal heart sounds.   No murmur heard. Respiratory: Effort normal and breath sounds normal.  GI: Soft. She exhibits no distension and no mass. There is no tenderness.  Musculoskeletal: She exhibits edema.  Lymphadenopathy:    She has no cervical adenopathy.  Neurological: She is alert.  Skin: Skin is warm and dry.     Assessment/Plan Solid food dysphagia. Complicated GERD. EGD with ED.  Hildred Laser, MD 09/14/2016, 12:58 PM

## 2016-09-19 ENCOUNTER — Encounter (HOSPITAL_COMMUNITY): Payer: Self-pay | Admitting: Internal Medicine

## 2016-09-30 ENCOUNTER — Encounter (INDEPENDENT_AMBULATORY_CARE_PROVIDER_SITE_OTHER): Payer: Self-pay

## 2016-11-14 ENCOUNTER — Ambulatory Visit (INDEPENDENT_AMBULATORY_CARE_PROVIDER_SITE_OTHER): Payer: Medicare HMO | Admitting: Internal Medicine

## 2016-11-14 ENCOUNTER — Encounter (INDEPENDENT_AMBULATORY_CARE_PROVIDER_SITE_OTHER): Payer: Self-pay | Admitting: Internal Medicine

## 2016-11-14 VITALS — BP 162/80 | HR 60 | Temp 98.8°F | Ht 65.0 in | Wt 181.8 lb

## 2016-11-14 DIAGNOSIS — R1310 Dysphagia, unspecified: Secondary | ICD-10-CM | POA: Diagnosis not present

## 2016-11-14 DIAGNOSIS — R1319 Other dysphagia: Secondary | ICD-10-CM

## 2016-11-14 DIAGNOSIS — K279 Peptic ulcer, site unspecified, unspecified as acute or chronic, without hemorrhage or perforation: Secondary | ICD-10-CM

## 2016-11-14 NOTE — Progress Notes (Signed)
Subjective:    Patient ID: Valerie Griffin, female    DOB: May 17, 1934, 83 y.o.   MRN: [id number]  HPI Here today for f/u after undergoing and EGD I April for dysphagia.  EGD in April revealed Impression:               - Normal proximal esophagus.                           - Non-bleeding esophageal ulcers.                           - Benign-appearing esophageal stenosis. Dilated.                           - 7 cm hiatal hernia.                           - Normal stomach.                           - Normal duodenal bulb and second portion of the                            duodenum.                           - No specimens collected. She tells me she is doing good. She does get tired easily. Her swallowing has improved. She says most of the time she does good. Meats are sometimes a problem.  Acid reflux controlled with Prevacid.  BM daily with a stool softener. Sometimes her stools are as "hard as a rock"  She drinks several glasses of ice tea and water. Sometimes she will drink a soda.   Review of Systems Past Medical History:  Diagnosis Date  . Constipation   . Dysphagia 08/16/2016  . GERD (gastroesophageal reflux disease)     Past Surgical History:  Procedure Laterality Date  . ABDOMINAL HYSTERECTOMY    . APPENDECTOMY    . broken arm and wrist with plate and scews rt wrist    . CHOLECYSTECTOMY    . complete hysterectomy'     fibroid tumors bleeding  . ESOPHAGEAL DILATION N/A 08/22/2016   Procedure: ESOPHAGEAL DILATION;  Surgeon: Rogene Houston, MD;  [facility];  Service: Endoscopy;  Laterality: N/A;  . ESOPHAGEAL DILATION N/A 09/14/2016   Procedure: ESOPHAGEAL DILATION;  Surgeon: Rogene Houston, MD;  [facility];  Service: Endoscopy;  Laterality: N/A;  . ESOPHAGOGASTRODUODENOSCOPY N/A 08/22/2016   Procedure: ESOPHAGOGASTRODUODENOSCOPY (EGD);  Surgeon: Rogene Houston, MD;  [facility];  Service: Endoscopy;  Laterality: N/A;  .  ESOPHAGOGASTRODUODENOSCOPY N/A 09/14/2016   Procedure: ESOPHAGOGASTRODUODENOSCOPY (EGD);  Surgeon: Rogene Houston, MD;  [facility];  Service: Endoscopy;  Laterality: N/A;  1225    Allergies  Allergen Reactions  . Penicillins Rash    40 years ago, caused rash and tachycardia Has patient had a PCN reaction causing immediate rash, facial/tongue/throat swelling, SOB or lightheadedness with hypotension: No Has patient had a PCN reaction causing severe rash involving mucus membranes or skin necrosis: No Has patient had a PCN reaction that required hospitalization: No Has patient had a  PCN reaction occurring within the last 10 years: No If all of the above answers are "NO", then may proceed with Cephalosporin use.     Current Outpatient Prescriptions on File Prior to Visit  Medication Sig Dispense Refill  . aspirin EC 81 MG tablet Take 1 tablet (81 mg total) by mouth daily.    . Calcium Carb-Cholecalciferol [phone] MG-UNIT CAPS Take 1 tablet by mouth as needed.     . Cholecalciferol (VITAMIN D) 2000 units tablet Take 2,000 Units by mouth daily.    . Flaxseed, Linseed, (FLAXSEED OIL) 1200 MG CAPS Take 1 capsule by mouth 2 (two) times daily.     . lansoprazole (PREVACID) 30 MG capsule Take 1 capsule (30 mg total) by mouth 2 (two) times daily before a meal. 60 capsule 5  . metoCLOPramide (REGLAN) 5 MG tablet Take 1 tablet (5 mg total) by mouth 2 (two) times daily. 30 minutes before evening meal and at bedtime. 60 tablet 1  . Multiple Vitamin (MULTIVITAMIN) tablet Take 1 tablet by mouth daily.    . Omega-3 Fatty Acids (FISH OIL) 1000 MG CAPS Take 1,000 mg by mouth daily.     Marland Kitchen docusate sodium (COLACE) 100 MG capsule Take 200 mg by mouth at bedtime.      No current facility-administered medications on file prior to visit.         Objective:   Physical Exam  Blood pressure (!) 162/80, pulse 60, temperature 98.8 F (37.1 C), height 5\' 5"  (1.651 m), weight 181 lb 12.8 oz (82.5  kg). Alert and oriented. Skin warm and dry. Oral mucosa is moist.   . Sclera anicteric, conjunctivae is pink. Thyroid not enlarged. No cervical lymphadenopathy. Lungs clear. Heart regular rate and rhythm.  Abdomen is soft. Bowel sounds are positive. No hepatomegaly. No abdominal masses felt. No tenderness.  No edema to lower extremities.          Assessment & Plan:  Dysphagia. She is doing better.  She will continue the Prevacid twice a day.  OV in one year.  If any problems, call our office.

## 2016-11-14 NOTE — Patient Instructions (Signed)
Continue the Prevacid. OV in 1 year.

## 2017-06-20 ENCOUNTER — Ambulatory Visit (INDEPENDENT_AMBULATORY_CARE_PROVIDER_SITE_OTHER): Payer: Medicare HMO | Admitting: Internal Medicine

## 2017-06-20 ENCOUNTER — Encounter (INDEPENDENT_AMBULATORY_CARE_PROVIDER_SITE_OTHER): Payer: Self-pay | Admitting: *Deleted

## 2017-06-20 ENCOUNTER — Encounter (INDEPENDENT_AMBULATORY_CARE_PROVIDER_SITE_OTHER): Payer: Self-pay | Admitting: Internal Medicine

## 2017-06-20 VITALS — BP 170/80 | HR 60 | Temp 97.7°F | Ht 65.5 in | Wt 172.0 lb

## 2017-06-20 DIAGNOSIS — K222 Esophageal obstruction: Secondary | ICD-10-CM | POA: Diagnosis not present

## 2017-06-20 DIAGNOSIS — R1310 Dysphagia, unspecified: Secondary | ICD-10-CM

## 2017-06-20 DIAGNOSIS — R1319 Other dysphagia: Secondary | ICD-10-CM

## 2017-06-20 NOTE — Progress Notes (Signed)
Subjective:    Patient ID: Valerie Griffin, female    DOB: 1933/06/23, 84 y.o.   MRN: [id number]  HPI Presents today with c/o dysphagia.  PCP Dr. Woody Seller.  She tells me it is hard to eat. She has had a URI and coughed up some bloody mucous.  She says she is having some trouble swallowing. She says she has to eat soft foods mostly. Symptoms for a couple of months.  She occasionally has acid reflux. She denies any chest pain.  Her appetite is good.  She sometimes has trouble with cream potatoes and meats.   09/14/2016 EGD: dysphagia:                             - Non-bleeding esophageal ulcers.                           - Benign-appearing esophageal stenosis. Dilated.                           - 7 cm hiatal hernia.                           - Normal stomach.                           - Normal duodenal bulb and second portion of the                            duodenum.                           - No specimens collected.                           Comment: Incomplete healing of esophagitis.                           Stricture at GE junction dilated to 16.5 mm.      Underwent and EGD in April of 2018:  Impression:               - Normal proximal esophagus.                           - Non-bleeding esophageal ulcers. Extensive                            ulceration involving 7 cm of esophageal mucos down                            to GE junction.                           - Diverticulum in the distal esophagus.                           - Benign-appearing esophageal stenosis. Dilated.                           -  7 cm hiatal hernia.                           - Normal stomach.                           - Normal duodenal bulb and second portion of the                            duodenum.                           - No specimens collected.    Review of Systems Past Medical History:  Diagnosis Date  . Constipation   . Dysphagia 08/16/2016  . GERD (gastroesophageal reflux disease)      Past Surgical History:  Procedure Laterality Date  . ABDOMINAL HYSTERECTOMY    . APPENDECTOMY    . broken arm and wrist with plate and scews rt wrist    . CHOLECYSTECTOMY    . complete hysterectomy'     fibroid tumors bleeding  . ESOPHAGEAL DILATION N/A 08/22/2016   Procedure: ESOPHAGEAL DILATION;  Surgeon: Rogene Houston, MD;  [facility];  Service: Endoscopy;  Laterality: N/A;  . ESOPHAGEAL DILATION N/A 09/14/2016   Procedure: ESOPHAGEAL DILATION;  Surgeon: Rogene Houston, MD;  [facility];  Service: Endoscopy;  Laterality: N/A;  . ESOPHAGOGASTRODUODENOSCOPY N/A 08/22/2016   Procedure: ESOPHAGOGASTRODUODENOSCOPY (EGD);  Surgeon: Rogene Houston, MD;  [facility];  Service: Endoscopy;  Laterality: N/A;  . ESOPHAGOGASTRODUODENOSCOPY N/A 09/14/2016   Procedure: ESOPHAGOGASTRODUODENOSCOPY (EGD);  Surgeon: Rogene Houston, MD;  [facility];  Service: Endoscopy;  Laterality: N/A;  1225    Allergies  Allergen Reactions  . Penicillins Rash    40 years ago, caused rash and tachycardia Has patient had a PCN reaction causing immediate rash, facial/tongue/throat swelling, SOB or lightheadedness with hypotension: No Has patient had a PCN reaction causing severe rash involving mucus membranes or skin necrosis: No Has patient had a PCN reaction that required hospitalization: No Has patient had a PCN reaction occurring within the last 10 years: No If all of the above answers are "NO", then may proceed with Cephalosporin use.     Current Outpatient Medications on File Prior to Visit  Medication Sig Dispense Refill  . aspirin EC 81 MG tablet Take 1 tablet (81 mg total) by mouth daily.    . Cholecalciferol (VITAMIN D) 2000 units tablet Take 2,000 Units by mouth daily.    Marland Kitchen docusate sodium (COLACE) 100 MG capsule Take 200 mg by mouth.    . Flaxseed, Linseed, (FLAXSEED OIL) 1200 MG CAPS Take 1 capsule by mouth 2 (two) times daily.     . lansoprazole  (PREVACID) 30 MG capsule Take 1 capsule (30 mg total) by mouth 2 (two) times daily before a meal. (Patient taking differently: Take 20 mg by mouth 2 (two) times daily before a meal. ) 60 capsule 5  . Magnesium 250 MG TABS Take by mouth.    . Multiple Vitamin (MULTIVITAMIN) tablet Take 1 tablet by mouth daily.    . Omega-3 Fatty Acids (FISH OIL) 1000 MG CAPS Take 1,000 mg by mouth daily.     Marland Kitchen docusate sodium (COLACE) 100 MG capsule Take 200 mg by mouth at bedtime.  No current facility-administered medications on file prior to visit.         Objective:   Physical Exam Blood pressure (!) 170/80, pulse 60, temperature 97.7 F (36.5 C), height 5' 5.5" (1.664 m), weight 172 lb (78 kg). Alert and oriented. Skin warm and dry. Oral mucosa is moist.   . Sclera anicteric, conjunctivae is pink. Thyroid not enlarged. No cervical lymphadenopathy. Lungs clear. Heart regular rate and rhythm.  Abdomen is soft. Bowel sounds are positive. No hepatomegaly. No abdominal masses felt. No tenderness.  No edema to lower extremities.  .         Assessment & Plan:  Dysphagia. Esophageal stenosis. I discussed with Dr Laural Golden. EGD/ED.

## 2017-07-26 ENCOUNTER — Encounter (HOSPITAL_COMMUNITY): Payer: Self-pay

## 2017-07-26 ENCOUNTER — Encounter (HOSPITAL_COMMUNITY)
Admission: RE | Disposition: A | Discharge status: Home / Self Care | Payer: Self-pay | Source: Ambulatory Visit | Attending: Internal Medicine

## 2017-07-26 ENCOUNTER — Other Ambulatory Visit: Payer: Self-pay

## 2017-07-26 ENCOUNTER — Ambulatory Visit (HOSPITAL_COMMUNITY)
Admission: RE | Admit: 2017-07-26 | Discharge: 2017-07-26 | Disposition: A | Discharge status: Home / Self Care | Payer: Medicare HMO | Source: Ambulatory Visit | Attending: Internal Medicine | Admitting: Internal Medicine

## 2017-07-26 DIAGNOSIS — Z79899 Other long term (current) drug therapy: Secondary | ICD-10-CM | POA: Diagnosis not present

## 2017-07-26 DIAGNOSIS — R1310 Dysphagia, unspecified: Secondary | ICD-10-CM

## 2017-07-26 DIAGNOSIS — K222 Esophageal obstruction: Secondary | ICD-10-CM | POA: Diagnosis not present

## 2017-07-26 DIAGNOSIS — K210 Gastro-esophageal reflux disease with esophagitis: Secondary | ICD-10-CM | POA: Insufficient documentation

## 2017-07-26 DIAGNOSIS — Z7982 Long term (current) use of aspirin: Secondary | ICD-10-CM | POA: Insufficient documentation

## 2017-07-26 DIAGNOSIS — K3189 Other diseases of stomach and duodenum: Secondary | ICD-10-CM | POA: Insufficient documentation

## 2017-07-26 DIAGNOSIS — R1314 Dysphagia, pharyngoesophageal phase: Secondary | ICD-10-CM | POA: Diagnosis not present

## 2017-07-26 DIAGNOSIS — Z880 Allergy status to penicillin: Secondary | ICD-10-CM | POA: Insufficient documentation

## 2017-07-26 DIAGNOSIS — K449 Diaphragmatic hernia without obstruction or gangrene: Secondary | ICD-10-CM | POA: Diagnosis not present

## 2017-07-26 DIAGNOSIS — K766 Portal hypertension: Secondary | ICD-10-CM

## 2017-07-26 DIAGNOSIS — R1319 Other dysphagia: Secondary | ICD-10-CM

## 2017-07-26 HISTORY — PX: ESOPHAGOGASTRODUODENOSCOPY: SHX5428

## 2017-07-26 HISTORY — PX: ESOPHAGEAL DILATION: SHX303

## 2017-07-26 SURGERY — EGD (ESOPHAGOGASTRODUODENOSCOPY)
Anesthesia: Moderate Sedation

## 2017-07-26 MED ORDER — METOCLOPRAMIDE HCL 10 MG PO TABS: 10.0000 mg | ORAL_TABLET | Freq: Every day | ORAL | 0 refills | Status: DC

## 2017-07-26 MED ORDER — MEPERIDINE HCL 50 MG/ML IJ SOLN
INTRAMUSCULAR | Status: AC
  Filled 2017-07-26: qty 1

## 2017-07-26 MED ORDER — SODIUM CHLORIDE 0.9 % IV SOLN
INTRAVENOUS | Status: DC
  Administered 2017-07-26: 20 mL/h via INTRAVENOUS

## 2017-07-26 MED ORDER — STERILE WATER FOR IRRIGATION IR SOLN
Status: DC | PRN
  Administered 2017-07-26: 13:00:00

## 2017-07-26 MED ORDER — LIDOCAINE VISCOUS 2 % MT SOLN
OROMUCOSAL | Status: DC | PRN
  Administered 2017-07-26: 4 mL via OROMUCOSAL

## 2017-07-26 MED ORDER — MIDAZOLAM HCL 5 MG/5ML IJ SOLN
INTRAMUSCULAR | Status: DC | PRN
  Administered 2017-07-26 (×2): 2 mg via INTRAVENOUS

## 2017-07-26 MED ORDER — LIDOCAINE VISCOUS 2 % MT SOLN
OROMUCOSAL | Status: AC
  Filled 2017-07-26: qty 15

## 2017-07-26 MED ORDER — MIDAZOLAM HCL 5 MG/5ML IJ SOLN
INTRAMUSCULAR | Status: AC
  Filled 2017-07-26: qty 10

## 2017-07-26 MED ORDER — MEPERIDINE HCL 50 MG/ML IJ SOLN
INTRAMUSCULAR | Status: DC | PRN
  Administered 2017-07-26 (×2): 25 mg via INTRAVENOUS

## 2017-07-26 NOTE — Op Note (Signed)
[facility] Patient Name: Ligia Duguay Procedure Date: 07/26/2017 12:49 PM MRN: [id number] Date of Birth: Oct 09, 1933 Attending MD: Hildred Laser , MD CSN: [id number] Age: 84 Admit Type: Outpatient Procedure:                Upper GI endoscopy Indications:              Esophageal dysphagia, For therapy of esophageal                            stricture Providers:                Hildred Laser, MD, Otis Peak B. Sharon Seller, RN, Randa Spike, Technician Referring MD:             Glenda Chroman, MD Medicines:                Lidocaine spray, Meperidine 50 mg IV, Midazolam 4                            mg IV Complications:            No immediate complications. Estimated Blood Loss:     Estimated blood loss was minimal. Procedure:                Pre-Anesthesia Assessment:                           - Prior to the procedure, a History and Physical                            was performed, and patient medications and                            allergies were reviewed. The patient's tolerance of                            previous anesthesia was also reviewed. The risks                            and benefits of the procedure and the sedation                            options and risks were discussed with the patient.                            All questions were answered, and informed consent                            was obtained. Prior Anticoagulants: The patient                            last took aspirin 3 days prior to the procedure.  ASA Grade Assessment: I - A normal, healthy                            patient. After reviewing the risks and benefits,                            the patient was deemed in satisfactory condition to                            undergo the procedure.                           After obtaining informed consent, the endoscope was                            passed under direct vision. Throughout the                    procedure, the patient's blood pressure, pulse, and                            oxygen saturations were monitored continuously. The                            [id number] ([id number]) scope was introduced through the                            mouth, and advanced to the second part of duodenum.                            The upper GI endoscopy was accomplished without                            difficulty. The patient tolerated the procedure                            well. Scope In: 1:24:49 PM Scope Out: 1:32:41 PM Total Procedure Duration: 0 hours 7 minutes 52 seconds  Findings:      The proximal esophagus was normal.      LA Grade D (one or more mucosal breaks involving at least 75% of       esophageal circumference) esophagitis was found 25 cm from the incisors.      One severe benign-appearing, intrinsic stenosis was found 31 cm from the       incisors. This measured 1 cm (inner diameter) and was traversed. A TTS       dilator was passed through the scope. Dilation with a 15-16.5-18 mm       balloon dilator was performed to 15 mm. The dilation site was examined       and showed moderate improvement in luminal narrowing and no perforation.      A 7 cm hiatal hernia was present.      Portal hypertensive gastropathy was found in the gastric fundus and in       the gastric body.      The exam of the stomach was otherwise normal.  The duodenal bulb and second portion of the duodenum were normal. Impression:               - Normal proximal esophagus.                           - LA Grade D reflux esophagitis.                           - Benign-appearing esophageal stenosis. Dilated.                           - 7 cm hiatal hernia.                           - Portal hypertensive gastropathy.                           - Normal duodenal bulb and second portion of the                            duodenum.                           - No specimens collected. Moderate Sedation:       Moderate (conscious) sedation was administered by the endoscopy nurse       and supervised by the endoscopist. The following parameters were       monitored: oxygen saturation, heart rate, blood pressure, CO2       capnography and response to care. Total physician intraservice time was       15 minutes. Recommendation:           - Patient has a contact number available for                            emergencies. The signs and symptoms of potential                            delayed complications were discussed with the                            patient. Return to normal activities tomorrow.                            Written discharge instructions were provided to the                            patient.                           - Mechanical soft diet today.                           - Continue present medications.                           - Resume aspirin at prior dose on 07/30/17.                           -  Metoclopromide 10 mg po qhs.                           - Repeat upper endoscopy in 1 month. Procedure Code(s):        --- Professional ---                           [phone], Esophagogastroduodenoscopy, flexible,                            transoral; with transendoscopic balloon dilation of                            esophagus (less than 30 mm diameter)                           [id number], Moderate sedation services provided by the                            same physician or other qualified health care                            professional performing the diagnostic or                            therapeutic service that the sedation supports,                            requiring the presence of an independent trained                            observer to assist in the monitoring of the                            patient's level of consciousness and physiological                            status; initial 15 minutes of intraservice time,                            patient age 33 years  or older Diagnosis Code(s):        --- Professional ---                           K21.0, Gastro-esophageal reflux disease with                            esophagitis                           K22.2, Esophageal obstruction                           K44.9, Diaphragmatic hernia without obstruction or  gangrene                           K76.6, Portal hypertension                           K31.89, Other diseases of stomach and duodenum                           R13.14, Dysphagia, pharyngoesophageal phase CPT copyright 2016 American Medical Association. All rights reserved. The codes documented in this report are preliminary and upon coder review may  be revised to meet current compliance requirements. Hildred Laser, MD Hildred Laser, MD 07/26/2017 1:47:41 PM This report has been signed electronically. Number of Addenda: 0

## 2017-07-26 NOTE — H&P (Signed)
Valerie Griffin is an 84 y.o. female.   Chief Complaint: She is here for EGD and ED. HPI: Patient is 84 year old Caucasian female who was history of ulcerative reflux esophagitis complicated by esophageal stricture who is here for EGD with esophageal dilation.  She says she does not have heartburn often while on double dose PPI.  She has difficulty with solids as well as pills.  Her last dilation was in April 2018.  Initially stricture was dilated to 15 mm and she returned 3 weeks later and was dilated to 16.5 mm.  Her esophagitis had not completely healed.  States she has had dysphagia for several months but has been gradually getting worse.  Has lost weight this year on account of her son's illness.  She just has not had good appetite.  She says she has been under a lot of stress.  She lost her son 2 weeks ago because of metastatic stomach cancer.  He was 82 years old.  Past Medical History:  Diagnosis Date  . Constipation   . Dysphagia 08/16/2016  . GERD (gastroesophageal reflux disease)     Past Surgical History:  Procedure Laterality Date  . ABDOMINAL HYSTERECTOMY    . APPENDECTOMY    . broken arm and wrist with plate and scews rt wrist    . CHOLECYSTECTOMY    . complete hysterectomy'     fibroid tumors bleeding  . ESOPHAGEAL DILATION N/A 08/22/2016   Procedure: ESOPHAGEAL DILATION;  Surgeon: Rogene Houston, MD;  [facility];  Service: Endoscopy;  Laterality: N/A;  . ESOPHAGEAL DILATION N/A 09/14/2016   Procedure: ESOPHAGEAL DILATION;  Surgeon: Rogene Houston, MD;  Location: [facility];  Service: Endoscopy;  Laterality: N/A;  . ESOPHAGOGASTRODUODENOSCOPY N/A 08/22/2016   Procedure: ESOPHAGOGASTRODUODENOSCOPY (EGD);  Surgeon: Rogene Houston, MD;  Location: [facility];  Service: Endoscopy;  Laterality: N/A;  . ESOPHAGOGASTRODUODENOSCOPY N/A 09/14/2016   Procedure: ESOPHAGOGASTRODUODENOSCOPY (EGD);  Surgeon: Rogene Houston, MD;  Location: [facility];  Service:  Endoscopy;  Laterality: N/A;  1225    Family History  Problem Relation Age of Onset  . Heart Problems Mother   . Alzheimer's disease Mother   . Hypertension Mother   . Prostate cancer Father   . Heart attack Father   . Heart Problems Sister    Social History:  reports that  has never smoked. she has never used smokeless tobacco. She reports that she does not drink alcohol or use drugs.  Allergies:  Allergies  Allergen Reactions  . Penicillins Rash and Other (See Comments)    40 years ago, caused rash and tachycardia Has patient had a PCN reaction causing immediate rash, facial/tongue/throat swelling, SOB or lightheadedness with hypotension: No Has patient had a PCN reaction causing severe rash involving mucus membranes or skin necrosis: No Has patient had a PCN reaction that required hospitalization: No Has patient had a PCN reaction occurring within the last 10 years: No If all of the above answers are "NO", then may proceed with Cephalosporin use.     Medications Prior to Admission  Medication Sig Dispense Refill  . aspirin EC 81 MG tablet Take 1 tablet (81 mg total) by mouth daily.    . Biotin w/ Vitamins C & E (HAIR/SKIN/NAILS PO) Take 1 tablet by mouth daily.    . Calcium Carb-Cholecalciferol (CALCIUM 600/VITAMIN D3 PO) Take 1 tablet by mouth daily.    . Cholecalciferol (VITAMIN D) 2000 units tablet Take 2,000 Units by  mouth daily.    Marland Kitchen docusate sodium (COLACE) 100 MG capsule Take 300 mg by mouth at bedtime.     . Flaxseed, Linseed, (FLAXSEED OIL) 1000 MG CAPS Take 1,000 mg by mouth daily.     Marland Kitchen ibuprofen (ADVIL,MOTRIN) 200 MG tablet Take 200 mg by mouth every 6 (six) hours as needed for headache or moderate pain.    Marland Kitchen lansoprazole (PREVACID) 30 MG capsule Take 1 capsule (30 mg total) by mouth 2 (two) times daily before a meal. (Patient taking differently: Take 15 mg by mouth every evening. ) 60 capsule 5  . Magnesium 250 MG TABS Take 250 mg by mouth 2 (two) times daily.      . Multiple Vitamin (MULTIVITAMIN) tablet Take 1 tablet by mouth daily.    . Omega-3 Fatty Acids (FISH OIL) 1000 MG CAPS Take 1,000 mg by mouth daily.     Marland Kitchen trolamine salicylate (ASPERCREME) 10 % cream Apply 1 application topically as needed for muscle pain.    . TURMERIC CURCUMIN PO Take 1 capsule by mouth daily.      No results found for this or any previous visit (from the past 48 hour(s)). No results found.  ROS  Blood pressure (!) 153/69, pulse 77, temperature 98.4 F (36.9 C), temperature source Oral, resp. rate 15, height 5' 5.5" (1.664 m), weight 172 lb (78 kg), SpO2 100 %. Physical Exam  Constitutional: She appears well-developed and well-nourished.  HENT:  Mouth/Throat: Oropharynx is clear and moist.  Eyes: Conjunctivae are normal. No scleral icterus.  Neck: No thyromegaly present.  Cardiovascular: Normal rate, regular rhythm and normal heart sounds.  No murmur heard. Respiratory: Effort normal and breath sounds normal.  GI: Soft. She exhibits no distension and no mass.  Musculoskeletal: She exhibits no edema.  Lymphadenopathy:    She has no cervical adenopathy.  Neurological: She is alert.  Skin: Skin is warm and dry.     Assessment/Plan Esophageal dysphagia. Patient with known history of GERD and esophageal stricture. EGD with ED.  Hildred Laser, MD 07/26/2017, 1:15 PM

## 2017-07-26 NOTE — Discharge Instructions (Signed)
Resume aspirin on 07/30/2017. Resume other medications as before. Metoclopramide 10 mg by mouth daily at bedtime.  Stop this medication if you experience any side effects and call office. Mechanical soft diet. No driving for 24 hours. Repeat dilation in 1 month.       Esophagogastroduodenoscopy, Care After Refer to this sheet in the next few weeks. These instructions provide you with information about caring for yourself after your procedure. Your health care provider may also give you more specific instructions. Your treatment has been planned according to current medical practices, but problems sometimes occur. Call your health care provider if you have any problems or questions after your procedure. What can I expect after the procedure? After the procedure, it is common to have:  A sore throat.  Nausea.  Bloating.  Dizziness.  Fatigue.  Follow these instructions at home:  Do not eat or drink anything until the numbing medicine (local anesthetic) has worn off and your gag reflex has returned. You will know that the local anesthetic has worn off when you can swallow comfortably.  Do not drive for 24 hours if you received a medicine to help you relax (sedative).  If your health care provider took a tissue sample for testing during the procedure, make sure to get your test results. This is your responsibility. Ask your health care provider or the department performing the test when your results will be ready.  Keep all follow-up visits as told by your health care provider. This is important. Contact a health care provider if:  You cannot stop coughing.  You are not urinating.  You are urinating less than usual. Get help right away if:  You have trouble swallowing.  You cannot eat or drink.  You have throat or chest pain that gets worse.  You are dizzy or light-headed.  You faint.  You have nausea or vomiting.  You have chills.  You have a fever.  You have  severe abdominal pain.  You have black, tarry, or bloody stools. This information is not intended to replace advice given to you by your health care provider. Make sure you discuss any questions you have with your health care provider. Document Released: 04/25/2012 Document Revised: 10/15/2015 Document Reviewed: 04/02/2015 Elsevier Interactive Patient Education  2018 [place] Meal Plan A soft-food meal plan includes foods that are safe and easy to swallow. This meal plan typically is used:  If you are having trouble chewing or swallowing foods.  As a transition meal plan after only having had liquid meals for a long period.  What do I need to know about the soft-food meal plan? A soft-food meal plan includes tender foods that are soft and easy to chew and swallow. In most cases, bite-sized pieces of food are easier to swallow. A bite-sized piece is about  inch or smaller. Foods in this plan do not need to be ground or pureed. Foods that are very hard, crunchy, or sticky should be avoided. Also, breads, cereals, yogurts, and desserts with nuts, seeds, or fruits should be avoided. What foods can I eat? Grains Rice and wild rice. Moist bread, dressing, pasta, and noodles. Well-moistened dry or cooked cereals, such as farina (cooked wheat cereal), oatmeal, or grits. Biscuits, breads, muffins, pancakes, and waffles that have been well moistened. Vegetables Shredded lettuce. Cooked, tender vegetables, including potatoes without skins. Vegetable juices. Broths or creamed soups made with vegetables that are not stringy or chewy. Strained tomatoes (without seeds).  Fruits Canned or well-cooked fruits. Soft (ripe), peeled fresh fruits, such as peaches, nectarines, kiwi, cantaloupe, honeydew melon, and watermelon (without seeds). Soft berries with small seeds, such as strawberries. Fruit juices (without pulp). Meats and Other Protein Sources Moist, tender, lean beef. Mutton.  Lamb. Veal. Chicken. [place]. Liver. Ham. Fish without bones. Eggs. Dairy Milk, milk drinks, and cream. Plain cream cheese and cottage cheese. Plain yogurt. Sweets/Desserts Flavored gelatin desserts. Custard. Plain ice cream, frozen yogurt, sherbet, milk shakes, and malts. Plain cakes and cookies. Plain hard candy. Other Butter, margarine (without trans fat), and cooking oils. Mayonnaise. Cream sauces. Mild spices, salt, and sugar. Syrup, molasses, honey, and jelly. The items listed above may not be a complete list of recommended foods or beverages. Contact your dietitian for more options. What foods are not recommended? Grains Dry bread, toast, crackers that have not been moistened. Coarse or dry cereals, such as bran, granola, and shredded wheat. Tough or chewy crusty breads, such as [place] bread or baguettes. Vegetables Corn. Raw vegetables except shredded lettuce. Cooked vegetables that are tough or stringy. Tough, crisp, fried potatoes and potato skins. Fruits Fresh fruits with skins or seeds or both, such as apples, pears, or grapes. Stringy, high-pulp fruits, such as papaya, pineapple, coconut, or mango. Fruit leather, fruit roll-ups, and all dried fruits. Meats and Other Protein Sources Sausages and hot dogs. Meats with gristle. Fish with bones. Nuts, seeds, and chunky peanut or other nut butters. Sweets/Desserts Cakes or cookies that are very dry or chewy. The items listed above may not be a complete list of foods and beverages to avoid. Contact your dietitian for more information. This information is not intended to replace advice given to you by your health care provider. Make sure you discuss any questions you have with your health care provider. Document Released: 08/16/2007 Document Revised: 10/15/2015 Document Reviewed: 04/05/2013 Elsevier Interactive Patient Education  2017 [employer].

## 2017-07-27 ENCOUNTER — Other Ambulatory Visit (INDEPENDENT_AMBULATORY_CARE_PROVIDER_SITE_OTHER): Payer: Self-pay | Admitting: *Deleted

## 2017-07-27 DIAGNOSIS — R1319 Other dysphagia: Secondary | ICD-10-CM

## 2017-07-27 DIAGNOSIS — R1310 Dysphagia, unspecified: Secondary | ICD-10-CM

## 2017-07-28 ENCOUNTER — Encounter (HOSPITAL_COMMUNITY): Payer: Self-pay | Admitting: Internal Medicine

## 2017-07-28 ENCOUNTER — Encounter (INDEPENDENT_AMBULATORY_CARE_PROVIDER_SITE_OTHER): Payer: Self-pay | Admitting: *Deleted

## 2017-08-31 ENCOUNTER — Other Ambulatory Visit: Payer: Self-pay

## 2017-08-31 ENCOUNTER — Encounter (HOSPITAL_COMMUNITY)
Admission: RE | Disposition: A | Discharge status: Home / Self Care | Payer: Self-pay | Source: Ambulatory Visit | Attending: Internal Medicine

## 2017-08-31 ENCOUNTER — Encounter (HOSPITAL_COMMUNITY): Payer: Self-pay | Admitting: *Deleted

## 2017-08-31 ENCOUNTER — Ambulatory Visit (HOSPITAL_COMMUNITY)
Admission: RE | Admit: 2017-08-31 | Discharge: 2017-08-31 | Disposition: A | Discharge status: Home / Self Care | Payer: Medicare HMO | Source: Ambulatory Visit | Attending: Internal Medicine | Admitting: Internal Medicine

## 2017-08-31 DIAGNOSIS — K210 Gastro-esophageal reflux disease with esophagitis: Secondary | ICD-10-CM

## 2017-08-31 DIAGNOSIS — Z880 Allergy status to penicillin: Secondary | ICD-10-CM | POA: Diagnosis not present

## 2017-08-31 DIAGNOSIS — K222 Esophageal obstruction: Secondary | ICD-10-CM | POA: Insufficient documentation

## 2017-08-31 DIAGNOSIS — R1310 Dysphagia, unspecified: Secondary | ICD-10-CM

## 2017-08-31 DIAGNOSIS — R1319 Other dysphagia: Secondary | ICD-10-CM

## 2017-08-31 DIAGNOSIS — K3189 Other diseases of stomach and duodenum: Secondary | ICD-10-CM

## 2017-08-31 DIAGNOSIS — K449 Diaphragmatic hernia without obstruction or gangrene: Secondary | ICD-10-CM

## 2017-08-31 DIAGNOSIS — M1990 Unspecified osteoarthritis, unspecified site: Secondary | ICD-10-CM | POA: Insufficient documentation

## 2017-08-31 DIAGNOSIS — Z8249 Family history of ischemic heart disease and other diseases of the circulatory system: Secondary | ICD-10-CM | POA: Diagnosis not present

## 2017-08-31 DIAGNOSIS — K766 Portal hypertension: Secondary | ICD-10-CM | POA: Diagnosis not present

## 2017-08-31 DIAGNOSIS — Z79899 Other long term (current) drug therapy: Secondary | ICD-10-CM | POA: Insufficient documentation

## 2017-08-31 DIAGNOSIS — Z7982 Long term (current) use of aspirin: Secondary | ICD-10-CM | POA: Insufficient documentation

## 2017-08-31 HISTORY — DX: Unspecified osteoarthritis, unspecified site: M19.90

## 2017-08-31 HISTORY — DX: Anemia, unspecified: D64.9

## 2017-08-31 HISTORY — PX: ESOPHAGEAL DILATION: SHX303

## 2017-08-31 HISTORY — PX: ESOPHAGOGASTRODUODENOSCOPY: SHX5428

## 2017-08-31 SURGERY — EGD (ESOPHAGOGASTRODUODENOSCOPY)
Anesthesia: Moderate Sedation

## 2017-08-31 MED ORDER — MIDAZOLAM HCL 5 MG/5ML IJ SOLN
INTRAMUSCULAR | Status: DC | PRN
  Administered 2017-08-31: 1 mg via INTRAVENOUS
  Administered 2017-08-31: 2 mg via INTRAVENOUS

## 2017-08-31 MED ORDER — MIDAZOLAM HCL 5 MG/5ML IJ SOLN
INTRAMUSCULAR | Status: AC
  Filled 2017-08-31: qty 10

## 2017-08-31 MED ORDER — LIDOCAINE VISCOUS 2 % MT SOLN
OROMUCOSAL | Status: AC
  Filled 2017-08-31: qty 15

## 2017-08-31 MED ORDER — LIDOCAINE VISCOUS 2 % MT SOLN
OROMUCOSAL | Status: DC | PRN
  Administered 2017-08-31: 4 mL via OROMUCOSAL

## 2017-08-31 MED ORDER — MEPERIDINE HCL 50 MG/ML IJ SOLN
INTRAMUSCULAR | Status: DC | PRN
  Administered 2017-08-31: 25 mg
  Administered 2017-08-31: 25 mg via INTRAVENOUS

## 2017-08-31 MED ORDER — ESOMEPRAZOLE MAGNESIUM 40 MG PO CPDR: 40.0000 mg | DELAYED_RELEASE_CAPSULE | Freq: Two times a day (BID) | ORAL | 5 refills | Status: DC

## 2017-08-31 MED ORDER — MEPERIDINE HCL 50 MG/ML IJ SOLN
INTRAMUSCULAR | Status: AC
  Filled 2017-08-31: qty 1

## 2017-08-31 MED ORDER — STERILE WATER FOR IRRIGATION IR SOLN
Status: DC | PRN
  Administered 2017-08-31: 100 mL

## 2017-08-31 MED ORDER — SODIUM CHLORIDE 0.9 % IV SOLN
INTRAVENOUS | Status: DC
  Administered 2017-08-31: 14:00:00 via INTRAVENOUS

## 2017-08-31 NOTE — Discharge Instructions (Signed)
Discontinue lansoprazole. Begin esomeprazole 40 mg by mouth 30 minutes before breakfast and evening meal daily. Resume aspirin on September 03, 2017. Resume other medications as before. Resume usual diet. No driving for 24 hours. Repeat dilation in 8 weeks.  Esophagogastroduodenoscopy, Care After Refer to this sheet in the next few weeks. These instructions provide you with information about caring for yourself after your procedure. Your health care provider may also give you more specific instructions. Your treatment has been planned according to current medical practices, but problems sometimes occur. Call your health care provider if you have any problems or questions after your procedure. Dr Laural Golden: [phone]; after hours and weekends call the hospital and have the GI doctor on call paged; they will call you back What can I expect after the procedure? After the procedure, it is common to have:  A sore throat.  Nausea.  Bloating.  Dizziness.  Fatigue.  Follow these instructions at home:  Do not eat or drink anything until the numbing medicine (local anesthetic) has worn off and your gag reflex has returned. You will know that the local anesthetic has worn off when you can swallow comfortably.  Do not drive for 24 hours if you received a medicine to help you relax (sedative).  Keep all follow-up visits as told by your health care provider. This is important. Contact a health care provider if:  You cannot stop coughing.  You are not urinating.  You are urinating less than usual. Get help right away if:  You have trouble swallowing.  You cannot eat or drink.  You have throat or chest pain that gets worse.  You are dizzy or light-headed.  You faint.  You have nausea or vomiting.  You have chills.  You have a fever.  You have severe abdominal pain.  You have black, tarry, or bloody stools. This information is not intended to replace advice given to you by your  health care provider. Make sure you discuss any questions you have with your health care provider. Document Released: 04/25/2012 Document Revised: 10/15/2015 Document Reviewed: 04/02/2015 Elsevier Interactive Patient Education  [employer].

## 2017-08-31 NOTE — Op Note (Signed)
[facility] Patient Name: Valerie Griffin Procedure Date: 08/31/2017 3:18 PM MRN: [id number] Date of Birth: 12-20-1933 Attending MD: Hildred Laser , MD CSN: [id number] Age: 84 Admit Type: Outpatient Procedure:                Upper GI endoscopy Indications:              Therapeutic procedure, For therapy of esophageal                            stricture Providers:                Hildred Laser, MD, Lurline Del, RN, Nelma Rothman,                            Technician Referring MD:             Glenda Chroman, MD Medicines:                Lidocaine spray, Meperidine 50 mg IV, Midazolam 3                            mg IV Complications:            No immediate complications. Estimated Blood Loss:     Estimated blood loss was minimal. Procedure:                Pre-Anesthesia Assessment:                           - Prior to the procedure, a History and Physical                            was performed, and patient medications and                            allergies were reviewed. The patient's tolerance of                            previous anesthesia was also reviewed. The risks                            and benefits of the procedure and the sedation                            options and risks were discussed with the patient.                            All questions were answered, and informed consent                            was obtained. Prior Anticoagulants: The patient                            last took aspirin 2 days prior to the procedure.  ASA Grade Assessment: II - A patient with mild                            systemic disease. After reviewing the risks and                            benefits, the patient was deemed in satisfactory                            condition to undergo the procedure.                           After obtaining informed consent, the endoscope was                            passed under direct vision. Throughout the                    procedure, the patient's blood pressure, pulse, and                            oxygen saturations were monitored continuously. The                            EG-[id number] ([id number]) scope was introduced through the                            mouth, and advanced to the second part of duodenum.                            The upper GI endoscopy was accomplished without                            difficulty. The patient tolerated the procedure                            well. Scope In: 3:35:47 PM Scope Out: 3:43:04 PM Total Procedure Duration: 0 hours 7 minutes 17 seconds  Findings:      The proximal esophagus was normal.      LA Grade D (one or more mucosal breaks involving at least 75% of       esophageal circumference) esophagitis with no bleeding was found 23 to       32 cm from the incisors.      One benign-appearing, intrinsic moderate stenosis was found 32 cm from       the incisors. This stenosis measured 1.1 cm (inner diameter) x less than       one cm (in length). The stenosis was traversed. A TTS dilator was passed       through the scope. Dilation with a 15-16.5-18 mm balloon dilator was       performed to 15 mm and 16.5 mm. The dilation site was examined and       showed moderate mucosal disruption and no perforation.      A 7 cm hiatal hernia was present.      Mild portal hypertensive gastropathy was found in the gastric  fundus and       in the gastric body.      The exam of the stomach was otherwise normal.      The duodenal bulb and second portion of the duodenum were normal. Impression:               - Normal proximal esophagus.                           - LA Grade D reflux esophagitis.                           - Benign-appearing esophageal stenosis. Dilated.                           - 7 cm hiatal hernia.                           - Portal hypertensive gastropathy.                           - Normal duodenal bulb and second portion of the                             duodenum.                           - No specimens collected.                           Comment: Esophagitis not healing with current                            therapy. Moderate Sedation:      Moderate (conscious) sedation was administered by the endoscopy nurse       and supervised by the endoscopist. The following parameters were       monitored: oxygen saturation, heart rate, blood pressure, CO2       capnography and response to care. Total physician intraservice time was       13 minutes. Recommendation:           - Patient has a contact number available for                            emergencies. The signs and symptoms of potential                            delayed complications were discussed with the                            patient. Return to normal activities tomorrow.                            Written discharge instructions were provided to the                            patient.                           -  Resume previous diet today.                           - Continue present medications but stop                            Lansoprazole.                           - Esomprazole 40 mg po bid.                           - No aspirin, ibuprofen, naproxen, or other                            non-steroidal anti-inflammatory drugs for 3 days.                           - Repeat upper endoscopy in 8 weeks. Procedure Code(s):        --- Professional ---                           [phone], Esophagogastroduodenoscopy, flexible,                            transoral; with transendoscopic balloon dilation of                            esophagus (less than 30 mm diameter)                           G0500, Moderate sedation services provided by the                            same physician or other qualified health care                            professional performing a gastrointestinal                            endoscopic service that sedation supports,                             requiring the presence of an independent trained                            observer to assist in the monitoring of the                            patient's level of consciousness and physiological                            status; initial 15 minutes of intra-service time;                            patient age 41 years  or older (additional time may                            be reported with [phone], as appropriate) Diagnosis Code(s):        --- Professional ---                           K21.0, Gastro-esophageal reflux disease with                            esophagitis                           K22.2, Esophageal obstruction                           K44.9, Diaphragmatic hernia without obstruction or                            gangrene                           K76.6, Portal hypertension                           K31.89, Other diseases of stomach and duodenum CPT copyright 2017 American Medical Association. All rights reserved. The codes documented in this report are preliminary and upon coder review may  be revised to meet current compliance requirements. Hildred Laser, MD Hildred Laser, MD 08/31/2017 3:59:24 PM This report has been signed electronically. Number of Addenda: 0

## 2017-08-31 NOTE — H&P (Signed)
Valerie Griffin is an 84 y.o. female.   Chief Complaint: Patient is here for EGD and ED. HPI: Patient is 83 year old Caucasian female who was chronic GERD complicated by esophageal stricture who has required periodic dilations.  Last exam was on 07/26/2017 when she was noted to have ulcerative reflux esophagitis and high-grade stricture which was dilated to 15 mm.  She states she did well until 1 week ago when she is having difficulty swallowing solids.  She says heartburn is well controlled with therapy.  She is not having any side effects with metoclopramide.  She is maintaining her weight.  She denies nausea vomiting hematemesis or melena.  Past Medical History:  Diagnosis Date  . Anemia   . Arthritis   . Constipation   . Dysphagia 08/16/2016  . GERD (gastroesophageal reflux disease)     Past Surgical History:  Procedure Laterality Date  . ABDOMINAL HYSTERECTOMY    . APPENDECTOMY    . broken arm and wrist with plate and scews rt wrist    . CHOLECYSTECTOMY    . COLONOSCOPY    . complete hysterectomy'     fibroid tumors bleeding  . ESOPHAGEAL DILATION N/A 08/22/2016   Procedure: ESOPHAGEAL DILATION;  Surgeon: Rogene Houston, MD;  [facility];  Service: Endoscopy;  Laterality: N/A;  . ESOPHAGEAL DILATION N/A 09/14/2016   Procedure: ESOPHAGEAL DILATION;  Surgeon: Rogene Houston, MD;  Location: [facility];  Service: Endoscopy;  Laterality: N/A;  . ESOPHAGEAL DILATION N/A 07/26/2017   Procedure: ESOPHAGEAL DILATION;  Surgeon: Rogene Houston, MD;  [facility]: [facility];  Service: Endoscopy;  Laterality: N/A;  . ESOPHAGOGASTRODUODENOSCOPY N/A 08/22/2016   Procedure: ESOPHAGOGASTRODUODENOSCOPY (EGD);  Surgeon: Rogene Houston, MD;  [facility]: [facility];  Service: Endoscopy;  Laterality: N/A;  . ESOPHAGOGASTRODUODENOSCOPY N/A 09/14/2016   Procedure: ESOPHAGOGASTRODUODENOSCOPY (EGD);  Surgeon: Rogene Houston, MD;  [facility]: [facility];  Service: Endoscopy;  Laterality: N/A;   1225  . ESOPHAGOGASTRODUODENOSCOPY N/A 07/26/2017   Procedure: ESOPHAGOGASTRODUODENOSCOPY (EGD);  Surgeon: Rogene Houston, MD;  Location: [facility];  Service: Endoscopy;  Laterality: N/A;  1:45    Family History  Problem Relation Age of Onset  . Heart Problems Mother   . Alzheimer's disease Mother   . Hypertension Mother   . Prostate cancer Father   . Heart attack Father   . Heart Problems Sister    Social History:  reports that she has never smoked. She has never used smokeless tobacco. She reports that she does not drink alcohol or use drugs.  Allergies:  Allergies  Allergen Reactions  . Penicillins Rash and Other (See Comments)    40 years ago, caused rash and tachycardia Has patient had a PCN reaction causing immediate rash, facial/tongue/throat swelling, SOB or lightheadedness with hypotension: No Has patient had a PCN reaction causing severe rash involving mucus membranes or skin necrosis: No Has patient had a PCN reaction that required hospitalization: No Has patient had a PCN reaction occurring within the last 10 years: No If all of the above answers are "NO", then may proceed with Cephalosporin use.     Medications Prior to Admission  Medication Sig Dispense Refill  . aspirin EC 81 MG tablet Take 1 tablet (81 mg total) by mouth daily.    . Biotin w/ Vitamins C & E (HAIR/SKIN/NAILS PO) Take 1 tablet by mouth daily.    . Calcium Carb-Cholecalciferol (CALCIUM 600/VITAMIN D3 PO) Take 1 tablet by mouth daily.    Marland Kitchen  Cholecalciferol (VITAMIN D) 2000 units tablet Take 2,000 Units by mouth daily.    Marland Kitchen docusate sodium (COLACE) 100 MG capsule Take 300 mg by mouth at bedtime.     . Flaxseed, Linseed, (FLAXSEED OIL) 1000 MG CAPS Take 1,000 mg by mouth daily.     Marland Kitchen ibuprofen (ADVIL,MOTRIN) 200 MG tablet Take 200 mg by mouth every 6 (six) hours as needed for headache or moderate pain.    Marland Kitchen lansoprazole (PREVACID) 30 MG capsule Take 1 capsule (30 mg total) by mouth 2 (two) times  daily before a meal. (Patient taking differently: Take 15 mg by mouth every evening. ) 60 capsule 5  . Magnesium 250 MG TABS Take 250 mg by mouth 2 (two) times daily.     . metoCLOPramide (REGLAN) 10 MG tablet Take 1 tablet (10 mg total) by mouth at bedtime. 30 tablet 0  . Multiple Vitamin (MULTIVITAMIN) tablet Take 1 tablet by mouth daily.    . Omega-3 Fatty Acids (FISH OIL) 1000 MG CAPS Take 1,000 mg by mouth daily.     Marland Kitchen trolamine salicylate (ASPERCREME) 10 % cream Apply 1 application topically as needed for muscle pain.    . TURMERIC CURCUMIN PO Take 1 capsule by mouth daily.      No results found for this or any previous visit (from the past 48 hour(s)). No results found.  ROS  Blood pressure (!) 141/65, pulse 68, temperature 98.5 F (36.9 C), temperature source Oral, resp. rate (!) 25, height 5' 5.5" (1.664 m), weight 172 lb (78 kg), SpO2 100 %. Physical Exam  Constitutional: She appears well-developed and well-nourished.  HENT:  Mouth/Throat: Oropharynx is clear and moist.  She has upper and lower dentures in place.  Eyes: Conjunctivae are normal. No scleral icterus.  Neck: No thyromegaly present.  Cardiovascular: Normal rate, regular rhythm and normal heart sounds.  No murmur heard. Respiratory: Effort normal and breath sounds normal.  GI: Soft. She exhibits no distension and no mass. There is no tenderness.  Musculoskeletal: She exhibits no edema.  Lymphadenopathy:    She has no cervical adenopathy.  Neurological: She is alert.  Skin: Skin is warm and dry.     Assessment/Plan Esophageal stricture. EGD with ED.  Hildred Laser, MD 08/31/2017, 3:26 PM

## 2017-09-05 ENCOUNTER — Encounter (INDEPENDENT_AMBULATORY_CARE_PROVIDER_SITE_OTHER): Payer: Self-pay | Admitting: *Deleted

## 2017-09-06 ENCOUNTER — Other Ambulatory Visit (INDEPENDENT_AMBULATORY_CARE_PROVIDER_SITE_OTHER): Payer: Self-pay | Admitting: *Deleted

## 2017-09-06 ENCOUNTER — Encounter (HOSPITAL_COMMUNITY): Payer: Self-pay | Admitting: Internal Medicine

## 2017-09-06 DIAGNOSIS — K222 Esophageal obstruction: Secondary | ICD-10-CM

## 2017-10-26 ENCOUNTER — Ambulatory Visit (HOSPITAL_COMMUNITY)
Admission: RE | Admit: 2017-10-26 | Discharge: 2017-10-26 | Disposition: A | Discharge status: Home / Self Care | Payer: Medicare HMO | Source: Ambulatory Visit | Attending: Internal Medicine | Admitting: Internal Medicine

## 2017-10-26 ENCOUNTER — Encounter (HOSPITAL_COMMUNITY)
Admission: RE | Disposition: A | Discharge status: Home / Self Care | Payer: Self-pay | Source: Ambulatory Visit | Attending: Internal Medicine

## 2017-10-26 ENCOUNTER — Other Ambulatory Visit: Payer: Self-pay

## 2017-10-26 ENCOUNTER — Encounter (HOSPITAL_COMMUNITY): Payer: Self-pay

## 2017-10-26 DIAGNOSIS — Z79899 Other long term (current) drug therapy: Secondary | ICD-10-CM | POA: Diagnosis not present

## 2017-10-26 DIAGNOSIS — K219 Gastro-esophageal reflux disease without esophagitis: Secondary | ICD-10-CM | POA: Insufficient documentation

## 2017-10-26 DIAGNOSIS — K210 Gastro-esophageal reflux disease with esophagitis: Secondary | ICD-10-CM | POA: Diagnosis not present

## 2017-10-26 DIAGNOSIS — Z880 Allergy status to penicillin: Secondary | ICD-10-CM | POA: Diagnosis not present

## 2017-10-26 DIAGNOSIS — R1314 Dysphagia, pharyngoesophageal phase: Secondary | ICD-10-CM | POA: Diagnosis not present

## 2017-10-26 DIAGNOSIS — Q396 Congenital diverticulum of esophagus: Secondary | ICD-10-CM | POA: Insufficient documentation

## 2017-10-26 DIAGNOSIS — K2270 Barrett's esophagus without dysplasia: Secondary | ICD-10-CM | POA: Diagnosis not present

## 2017-10-26 DIAGNOSIS — Z7982 Long term (current) use of aspirin: Secondary | ICD-10-CM | POA: Diagnosis not present

## 2017-10-26 DIAGNOSIS — K2950 Unspecified chronic gastritis without bleeding: Secondary | ICD-10-CM | POA: Insufficient documentation

## 2017-10-26 DIAGNOSIS — K222 Esophageal obstruction: Secondary | ICD-10-CM

## 2017-10-26 DIAGNOSIS — K449 Diaphragmatic hernia without obstruction or gangrene: Secondary | ICD-10-CM | POA: Diagnosis not present

## 2017-10-26 HISTORY — PX: ESOPHAGEAL DILATION: SHX303

## 2017-10-26 HISTORY — PX: ESOPHAGOGASTRODUODENOSCOPY: SHX5428

## 2017-10-26 SURGERY — EGD (ESOPHAGOGASTRODUODENOSCOPY)
Anesthesia: Moderate Sedation

## 2017-10-26 MED ORDER — STERILE WATER FOR IRRIGATION IR SOLN
Status: DC | PRN
  Administered 2017-10-26: 15 mL

## 2017-10-26 MED ORDER — LIDOCAINE VISCOUS HCL 2 % MT SOLN
OROMUCOSAL | Status: DC | PRN
  Administered 2017-10-26: 1 via OROMUCOSAL

## 2017-10-26 MED ORDER — MEPERIDINE HCL 50 MG/ML IJ SOLN
INTRAMUSCULAR | Status: AC
  Filled 2017-10-26: qty 1

## 2017-10-26 MED ORDER — SODIUM CHLORIDE 0.9 % IV SOLN
INTRAVENOUS | Status: DC
  Administered 2017-10-26: 11:00:00 via INTRAVENOUS

## 2017-10-26 MED ORDER — MIDAZOLAM HCL 5 MG/5ML IJ SOLN
INTRAMUSCULAR | Status: DC | PRN
  Administered 2017-10-26: 2 mg via INTRAVENOUS
  Administered 2017-10-26: 1 mg via INTRAVENOUS

## 2017-10-26 MED ORDER — MEPERIDINE HCL 50 MG/ML IJ SOLN
INTRAMUSCULAR | Status: DC | PRN
  Administered 2017-10-26: 10 mg via INTRAVENOUS
  Administered 2017-10-26: 25 mg via INTRAVENOUS
  Administered 2017-10-26: 15 mg via INTRAVENOUS

## 2017-10-26 MED ORDER — LIDOCAINE VISCOUS HCL 2 % MT SOLN
OROMUCOSAL | Status: AC
  Filled 2017-10-26: qty 15

## 2017-10-26 MED ORDER — MIDAZOLAM HCL 5 MG/5ML IJ SOLN
INTRAMUSCULAR | Status: AC
  Filled 2017-10-26: qty 10

## 2017-10-26 NOTE — Discharge Instructions (Signed)
No aspirin or NSAIDs for 3 days. Resume other medications as before.  Take metoclopramide before evening meal daily unless you have side effects. Mechanical soft diet. No driving for 24 hours. She will call with biopsy results       Esophagogastroduodenoscopy, Care After Refer to this sheet in the next few weeks. These instructions provide you with information about caring for yourself after your procedure. Your health care provider may also give you more specific instructions. Your treatment has been planned according to current medical practices, but problems sometimes occur. Call your health care provider if you have any problems or questions after your procedure. What can I expect after the procedure? After the procedure, it is common to have:  A sore throat.  Nausea.  Bloating.  Dizziness.  Fatigue.  Follow these instructions at home:  Do not eat or drink anything until the numbing medicine (local anesthetic) has worn off and your gag reflex has returned. You will know that the local anesthetic has worn off when you can swallow comfortably.  Do not drive for 24 hours if you received a medicine to help you relax (sedative).  If your health care provider took a tissue sample for testing during the procedure, make sure to get your test results. This is your responsibility. Ask your health care provider or the department performing the test when your results will be ready.  Keep all follow-up visits as told by your health care provider. This is important. Contact a health care provider if:  You cannot stop coughing.  You are not urinating.  You are urinating less than usual. Get help right away if:  You have trouble swallowing.  You cannot eat or drink.  You have throat or chest pain that gets worse.  You are dizzy or light-headed.  You faint.  You have nausea or vomiting.  You have chills.  You have a fever.  You have severe abdominal pain.  You have  black, tarry, or bloody stools. This information is not intended to replace advice given to you by your health care provider. Make sure you discuss any questions you have with your health care provider. Document Released: 04/25/2012 Document Revised: 10/15/2015 Document Reviewed: 04/02/2015 Elsevier Interactive Patient Education  2018 [employer].    Dysphagia Diet Level 2, Mechanically Altered The dysphagia level 2 diet includes foods that are blended, chopped, ground, or mashed so they are easier to chew and swallow. The foods are soft, moist, and can be chopped into -inch chunks (such as pancakes, pasta, and bananas). In order to be on this diet, you must be able to chew. This diet helps you transition between the pureed textures of the dysphagia level 1 diet to more solid textures. This diet is helpful for people with mild to moderate swallowing difficulties. It reduces the risk of food getting caught in the windpipe, trachea, or lungs. You may need help or supervision during meals while following this diet so that you eat safely. You will be on this diet until your health care provider advances the texture of your diet. What do I need to know about this diet? Foods  You may eat foods that are soft and moist. ? You may need to use a blender, whisk, or masher to soften some of your foods. ? You can moisten foods with gravies, sauces, vegetable or fruit juice, milk, half and half, or water when blending, mashing, or grinding your foods to the right consistency.  If you were on  the dysphagia level 1 diet, you may still eat any of the foods included in that diet.  Avoid foods that are dry, hard, sticky, chewy, coarse, and crunchy. Also avoid large cuts of food.  Take small bites. Each bite should contain  inch or less of food. Liquids  Avoid liquids with seeds and chunks.  Thicken liquids, if instructed by your health care provider. Your health care provider will tell you the  consistency to which you should thicken your liquids for safe swallowing. To thicken a liquid, use a commercial thickener or a thickening food (such as rice cereal or potato flakes). Ask your health care provider for specific recommendations on thickeners. See your dietitian or health care provider regularly for help with your dietary changes. What foods can I eat? Grains Store-bought soft breads that do not have nuts or seeds. Pancakes, sweet rolls, [place] pastries, and [place] toast that have been moistened with syrup or sauce to form a slurry when blended. Well-cooked pasta, noodles, and bread dressing. Well-cooked noodles and pasta in sauce. Moist macaroni and cheese. Soft dumplings or spaetzle with gravy or butter. Cooked cereals (including oatmeal). Low-texture dry cereals, such as rice puff, corn, or wheat-flake cereals, with milk (if thin liquids are not allowed, make sure all of the milk is absorbed by the cereal before eating it). Vegetables Very soft, well-cooked vegetables in pieces less than  inch in size. Cooked potatoes that are moist, not crispy, and with sauce. Fruits Canned or cooked fruits that are soft or moist and do not have skin or seeds. Fresh, soft bananas. Fruit juices with a small amount of pulp (if thin liquids are allowed). Gelatin or plain gelatin with canned fruit, except pineapple. Meat and Other Protein Sources Tender, moist meats, poultry, or fish cooked with gravy or sauce and cubed to -inch bites or smaller. Ground meat. Moist meatball or meatloaf. Fish without bones. Moist casseroles without rice. Tuna, egg, or meat salad without chunks or hard-to-chew vegetables, such as celery and onions. Smooth quiche without large chunks. Scrambled, poached, or soft-cooked eggs with butter, margarine, sauce, or gravy. Tofu. Well-cooked, moistened and mashed beans, peas, baked beans, and other legumes. Casseroles without rice (such as tuna noodle casserole or soft moist meat  lasagna). Dairy Cream cheese. Yogurt. Cottage cheese. Ask your health care provider if milk is allowed. Sweets/Desserts Pudding. Custard. Soft fruit pies with crust on the bottom only. Crisps and cobblers without seeds or nuts and with soft crusts. Soft, moist cakes. Icing. Pre-gelled cookies. Soft, moist cookies dunked in milk, coffee, or another liquid. Jelly. Soft, smooth chocolate bars that are easily chewed. Jams and preserves without seeds. Ask your health care provider whether you can have frozen desserts. Fats and Oils Butter. Margarine. Cream for cereal, depending on liquid consistency allowed. Gravy. Cream sauces. Mayonnaise. Salad dressings. Cream cheese. Cheese spreads, plain or with soft fruits or vegetables added. Sour cream. Sour cream dips with soft fruits or vegetables added. Whipped toppings. Other Sauces and salsas that have soft chunks that are about  inch or smaller. The items listed above may not be a complete list of recommended foods or beverages. Contact your dietitian for more options. What foods are not recommended? Grains All breads not listed in the recommended list. Breads that are hard or have nuts or seeds. Coarse cereals. Cereals that have nuts, seeds, dried fruits, or coconut. Rice. Corn. Vegetables Whole, raw, frozen, or dried vegetables. Tough, fibrous, chewy, or stringy cooked vegetables, such as celery, peas,  broccoli, cabbage, Brussels sprouts, and asparagus. Potato skins. Potato and other vegetable chips. Fried or French-fried potatoes. Cooked corn and peas. Fruits Whole raw, frozen, or dried fruits, including coconut. Pineapple. Fruits with seeds. Meat and Other Protein Sources Dry, tough meats, such as bacon, sausage, and hot dogs. Cheese slices and cubes. Peanut butter. Hard boiled or fried eggs. Nuts. Seeds. Pizza. Sandwiches. Dry casseroles or casseroles with rice or large chunks. Dairy Yogurt with nuts, seeds, or large  chunks. Sweets/Desserts Coarse, hard, chewy, or sticky desserts. Any dessert with nuts, seeds, coconut, pineapple, or dried fruit. Ask your health care provider whether you can have frozen desserts. Fats and Oils Avoid fats with chunky, large textures, such as those with nuts or fruits. Other Soups and casseroles with large chunks. The items listed above may not be a complete list of foods and beverages to avoid. Contact your dietitian for more information. This information is not intended to replace advice given to you by your health care provider. Make sure you discuss any questions you have with your health care provider. Document Released: 05/09/2005 Document Revised: 10/15/2015 Document Reviewed: 04/22/2013 Elsevier Interactive Patient Education  [employer].

## 2017-10-26 NOTE — Op Note (Signed)
[facility] Patient Name: Valerie Griffin Procedure Date: 10/26/2017 11:30 AM MRN: [id number] Date of Birth: 04-Apr-1934 Attending MD: Hildred Laser , MD CSN: [id number] Age: 84 Admit Type: Outpatient Procedure:                Upper GI endoscopy Indications:              Stricture of the esophagus, For therapy of                            esophageal stricture Providers:                Hildred Laser, MD, Charlsie Quest. Theda Sers RN, RN, Nelma Rothman, Technician Referring MD:              Medicines:                Lidocaine spray, Meperidine 50 mg IV, Midazolam 5                            mg IV Complications:            No immediate complications. Estimated Blood Loss:     Estimated blood loss was minimal. Procedure:                Pre-Anesthesia Assessment:                           - Prior to the procedure, a History and Physical                            was performed, and patient medications and                            allergies were reviewed. The patient's tolerance of                            previous anesthesia was also reviewed. The risks                            and benefits of the procedure and the sedation                            options and risks were discussed with the patient.                            All questions were answered, and informed consent                            was obtained. Prior Anticoagulants: The patient                            last took aspirin 3 days prior to the procedure.  ASA Grade Assessment: II - A patient with mild                            systemic disease. After reviewing the risks and                            benefits, the patient was deemed in satisfactory                            condition to undergo the procedure.                           After obtaining informed consent, the endoscope was                            passed under direct vision. Throughout the                  procedure, the patient's blood pressure, pulse, and                            oxygen saturations were monitored continuously. The                            EG-[id number] ([id number]) scope was introduced through the                            mouth, and advanced to the second part of duodenum.                            The upper GI endoscopy was accomplished without                            difficulty. The patient tolerated the procedure                            well. Scope In: 12:03:31 PM Scope Out: 12:19:00 PM Total Procedure Duration: 0 hours 15 minutes 29 seconds  Findings:      The proximal esophagus was normal.      LA Grade D (one or more mucosal breaks involving at least 75% of       esophageal circumference) esophagitis was found 23 to 31 cm from the       incisors.      One benign-appearing, intrinsic severe stenosis was found 30 to 31 cm       from the incisors. This stenosis measured 8 mm (inner diameter) x 2 cm       (in length). The stenosis was traversed after dilation. The dilation       site was examined and showed mild mucosal disruption, mild improvement       in luminal narrowing and no perforation. This was biopsied with a cold       forceps for histology. The pathology specimen was placed into Bottle       Number 1. Small esophageal diverticulum noted at distal esophagus.      A large hiatal hernia was present.  The entire examined stomach was normal.      The duodenal bulb and second portion of the duodenum were normal. Impression:               - Normal proximal esophagus.                           - LA Grade D esophagitis.                           - Small esophageal diverticulum.                           - Benign-appearing esophageal stenosis. Biopsied.                           - Large hiatal hernia.                           - Normal stomach.                           - Normal duodenal bulb and second portion of the                             duodenum. Moderate Sedation:      Moderate (conscious) sedation was administered by the endoscopy nurse       and supervised by the endoscopist. The following parameters were       monitored: oxygen saturation, heart rate, blood pressure, CO2       capnography and response to care. Total physician intraservice time was       23 minutes. Recommendation:           - Patient has a contact number available for                            emergencies. The signs and symptoms of potential                            delayed complications were discussed with the                            patient. Return to normal activities tomorrow.                            Written discharge instructions were provided to the                            patient.                           - Mechanical soft diet today.                           - Continue present medications.                           - Continue present medications.                           -  No aspirin, ibuprofen, naproxen, or other                            non-steroidal anti-inflammatory drugs for 3 days.                           - Await pathology results.                           - Repeat upper endoscopy in 4 weeks. Procedure Code(s):        --- Professional ---                           [phone], Esophagogastroduodenoscopy, flexible,                            transoral; with biopsy, single or multiple                           G0500, Moderate sedation services provided by the                            same physician or other qualified health care                            professional performing a gastrointestinal                            endoscopic service that sedation supports,                            requiring the presence of an independent trained                            observer to assist in the monitoring of the                            patient's level of consciousness and physiological                             status; initial 15 minutes of intra-service time;                            patient age 16 years or older (additional time may                            be reported with [phone], as appropriate)                           [phone], Moderate sedation services provided by the                            same physician or other qualified health care  professional performing the diagnostic or                            therapeutic service that the sedation supports,                            requiring the presence of an independent trained                            observer to assist in the monitoring of the                            patient's level of consciousness and physiological                            status; each additional 15 minutes intraservice                            time (List separately in addition to code for                            primary service) Diagnosis Code(s):        --- Professional ---                           K20.9, Esophagitis, unspecified                           K22.2, Esophageal obstruction                           K44.9, Diaphragmatic hernia without obstruction or                            gangrene CPT copyright 2017 [employer]. All rights reserved. The codes documented in this report are preliminary and upon coder review may  be revised to meet current compliance requirements. Hildred Laser, MD Hildred Laser, MD 10/26/2017 12:34:28 PM This report has been signed electronically. Number of Addenda: 0

## 2017-10-26 NOTE — H&P (Signed)
Valerie Griffin is an 84 y.o. female.   Chief Complaint: Patient is here for EGD and ED. HPI: Patient is 84 year old Caucasian female who has refractory GERD complicated by high-grade esophageal stricture which has been dilated twice this year most recently on 08/31/2017.  She is having increasing difficulty over the last few weeks.  She states she did quite well for few weeks.  She is not having any heartburn.  She denies hematemesis or melena.  She is maintaining her weight. Last aspirin dose was 3 days ago.  Past Medical History:  Diagnosis Date  . Anemia   . Arthritis   . Constipation   . Dysphagia 08/16/2016  . GERD (gastroesophageal reflux disease)     Past Surgical History:  Procedure Laterality Date  . ABDOMINAL HYSTERECTOMY    . APPENDECTOMY    . broken arm and wrist with plate and scews rt wrist    . CHOLECYSTECTOMY    . COLONOSCOPY    . complete hysterectomy'     fibroid tumors bleeding  . ESOPHAGEAL DILATION N/A 08/22/2016   Procedure: ESOPHAGEAL DILATION;  Surgeon: Rogene Houston, MD;  [facility];  Service: Endoscopy;  Laterality: N/A;  . ESOPHAGEAL DILATION N/A 09/14/2016   Procedure: ESOPHAGEAL DILATION;  Surgeon: Rogene Houston, MD;  [facility];  Service: Endoscopy;  Laterality: N/A;  . ESOPHAGEAL DILATION N/A 07/26/2017   Procedure: ESOPHAGEAL DILATION;  Surgeon: Rogene Houston, MD;  [facility];  Service: Endoscopy;  Laterality: N/A;  . ESOPHAGEAL DILATION N/A 08/31/2017   Procedure: ESOPHAGEAL DILATION;  Surgeon: Rogene Houston, MD;  [facility];  Service: Endoscopy;  Laterality: N/A;  . ESOPHAGOGASTRODUODENOSCOPY N/A 08/22/2016   Procedure: ESOPHAGOGASTRODUODENOSCOPY (EGD);  Surgeon: Rogene Houston, MD;  [facility];  Service: Endoscopy;  Laterality: N/A;  . ESOPHAGOGASTRODUODENOSCOPY N/A 09/14/2016   Procedure: ESOPHAGOGASTRODUODENOSCOPY (EGD);  Surgeon: Rogene Houston, MD;  [facility];   Service: Endoscopy;  Laterality: N/A;  1225  . ESOPHAGOGASTRODUODENOSCOPY N/A 07/26/2017   Procedure: ESOPHAGOGASTRODUODENOSCOPY (EGD);  Surgeon: Rogene Houston, MD;  [facility];  Service: Endoscopy;  Laterality: N/A;  1:45  . ESOPHAGOGASTRODUODENOSCOPY N/A 08/31/2017   Procedure: ESOPHAGOGASTRODUODENOSCOPY (EGD);  Surgeon: Rogene Houston, MD;  [facility];  Service: Endoscopy;  Laterality: N/A;    Family History  Problem Relation Age of Onset  . Heart Problems Mother   . Alzheimer's disease Mother   . Hypertension Mother   . Prostate cancer Father   . Heart attack Father   . Heart Problems Sister    Social History:  reports that she has never smoked. She has never used smokeless tobacco. She reports that she does not drink alcohol or use drugs.  Allergies:  Allergies  Allergen Reactions  . Penicillins Rash and Other (See Comments)    40 years ago, caused rash and tachycardia Has patient had a PCN reaction causing immediate rash, facial/tongue/throat swelling, SOB or lightheadedness with hypotension: No Has patient had a PCN reaction causing severe rash involving mucus membranes or skin necrosis: No Has patient had a PCN reaction that required hospitalization: No Has patient had a PCN reaction occurring within the last 10 years: No If all of the above answers are "NO", then may proceed with Cephalosporin use.     Medications Prior to Admission  Medication Sig Dispense Refill  . aspirin EC 81 MG tablet Take 1 tablet (81 mg total) by mouth  daily.    . docusate sodium (COLACE) 100 MG capsule Take 100 mg by mouth 3 (three) times daily.     Marland Kitchen esomeprazole (NEXIUM) 40 MG capsule Take 1 capsule (40 mg total) by mouth 2 (two) times daily before a meal. (Patient taking differently: Take 20 mg by mouth daily. ) 60 capsule 5  . Flaxseed, Linseed, (FLAXSEED OIL) 1000 MG CAPS Take 1,000 mg by mouth daily.     . Garlic (GARLIQUE PO) Take 1 capsule by mouth daily.     Marland Kitchen ibuprofen (ADVIL,MOTRIN) 200 MG tablet Take 200 mg by mouth every 6 (six) hours as needed for headache or moderate pain.    . Magnesium 250 MG TABS Take 250 mg by mouth daily.     . metoCLOPramide (REGLAN) 10 MG tablet Take 1 tablet (10 mg total) by mouth at bedtime. 30 tablet 0  . Multiple Vitamins-Minerals (MULTI + OMEGA-3 ADULT GUMMIES) CHEW Chew 120 mg by mouth daily. 60mg  each    . Omega-3 Fatty Acids (FISH OIL) 1200 MG CAPS Take 1,200 mg by mouth daily.    Marland Kitchen trolamine salicylate (ASPERCREME) 10 % cream Apply 1 application topically as needed for muscle pain.    . TURMERIC CURCUMIN PO Take 500 mg by mouth daily.       No results found for this or any previous visit (from the past 48 hour(s)). No results found.  ROS  Blood pressure (!) 156/60, pulse 67, temperature 98.6 F (37 C), temperature source Oral, resp. rate 17, height 5' 5.5" (1.664 m), weight 172 lb (78 kg), SpO2 100 %. Physical Exam  Constitutional: She appears well-developed and well-nourished.  HENT:  Mouth/Throat: Oropharynx is clear and moist.  She has upper and lower dentures.  Eyes: Conjunctivae are normal. No scleral icterus.  Neck: No thyromegaly present.  Cardiovascular: Normal rate, regular rhythm and normal heart sounds.  No murmur heard. Respiratory: Effort normal and breath sounds normal.  GI: Soft. She exhibits no distension and no mass. There is no tenderness.  Musculoskeletal: She exhibits no edema.  Neurological: She is alert.  Skin: Skin is warm and dry.     Assessment/Plan Esophageal dysphagia secondary to high-grade esophageal stricture due to GERD. EGD with ED.  Hildred Laser, MD 10/26/2017, 11:50 AM

## 2017-10-27 ENCOUNTER — Encounter (INDEPENDENT_AMBULATORY_CARE_PROVIDER_SITE_OTHER): Payer: Self-pay | Admitting: *Deleted

## 2017-10-27 ENCOUNTER — Other Ambulatory Visit (INDEPENDENT_AMBULATORY_CARE_PROVIDER_SITE_OTHER): Payer: Self-pay | Admitting: *Deleted

## 2017-10-27 DIAGNOSIS — K222 Esophageal obstruction: Secondary | ICD-10-CM

## 2017-11-01 ENCOUNTER — Telehealth (INDEPENDENT_AMBULATORY_CARE_PROVIDER_SITE_OTHER): Payer: Self-pay | Admitting: Internal Medicine

## 2017-11-01 NOTE — Telephone Encounter (Signed)
Forwarded to Karna Christmas  To addressed.

## 2017-11-01 NOTE — Telephone Encounter (Signed)
Ann, look at biopsy report, I believe Dr. Laural Golden wants a CT scan

## 2017-11-01 NOTE — Telephone Encounter (Signed)
Patient called wanted to know if she needs to have a CT scan done - please call back

## 2017-11-01 NOTE — Telephone Encounter (Signed)
Tammy,  I think Dr. Laural Golden wants a CT scan. I do not know if he has told Ann. Please remind him

## 2017-11-02 ENCOUNTER — Encounter (HOSPITAL_COMMUNITY): Payer: Self-pay | Admitting: Internal Medicine

## 2017-11-02 NOTE — Telephone Encounter (Signed)
Yes I know. CT is in authorization process

## 2017-11-02 NOTE — Telephone Encounter (Signed)
Per Dr.Rehman - yes he wants CT. Valerie Griffin is aware , we are waiting for the approval from Insurance. Patient is aware.

## 2017-11-02 NOTE — Telephone Encounter (Signed)
Ok, She call me wanting to know about the CT

## 2017-11-03 ENCOUNTER — Other Ambulatory Visit (INDEPENDENT_AMBULATORY_CARE_PROVIDER_SITE_OTHER): Payer: Self-pay | Admitting: *Deleted

## 2017-11-03 DIAGNOSIS — K222 Esophageal obstruction: Secondary | ICD-10-CM

## 2017-11-15 ENCOUNTER — Ambulatory Visit (INDEPENDENT_AMBULATORY_CARE_PROVIDER_SITE_OTHER): Payer: Medicare HMO | Admitting: Internal Medicine

## 2017-11-15 ENCOUNTER — Encounter (INDEPENDENT_AMBULATORY_CARE_PROVIDER_SITE_OTHER): Payer: Self-pay | Admitting: *Deleted

## 2017-11-15 ENCOUNTER — Encounter (INDEPENDENT_AMBULATORY_CARE_PROVIDER_SITE_OTHER): Payer: Self-pay | Admitting: Internal Medicine

## 2017-11-15 VITALS — BP 180/82 | HR 64 | Temp 98.9°F | Ht 65.5 in | Wt 174.6 lb

## 2017-11-15 DIAGNOSIS — R1310 Dysphagia, unspecified: Secondary | ICD-10-CM

## 2017-11-15 DIAGNOSIS — K209 Esophagitis, unspecified without bleeding: Secondary | ICD-10-CM

## 2017-11-15 DIAGNOSIS — R1319 Other dysphagia: Secondary | ICD-10-CM

## 2017-11-15 NOTE — Patient Instructions (Signed)
EGD in July. The risks of bleeding, perforation and infection were reviewed with patient.

## 2017-11-15 NOTE — Progress Notes (Signed)
Subjective:    Patient ID: Valerie Griffin, female    DOB: March 22, 1934, 84 y.o.   MRN: [id number]  HPI Here today for f/u. Last seen in January of this year. Wt in January 172. She c/o dysphagia. Hx of same. Recently underwent an EGD/ED in June of this year. LA grade D esophagitis. Benign appearing stenosis. Large hiatal hernia. Normal stomach. Normal duodenal bulb and 2nd portion of duodenum. Repeat endoscopy in 4 weeks.  Has had multiple EGDs in the past. Take Nexium for GERD.  She tells me she is doing good.  She says she still has trouble swallowing. She cannot eat meats at all. Her appetite okay. No weight loss. She eats a lot of oatmeal, soup, and creamed potatoes. She is eating soft foods. She says she stays constipated and take  Colace three tabs at night.   09/14/2016 EGD: dysphagia:   - Non-bleeding esophageal ulcers. - Benign-appearing esophageal stenosis. Dilated. - 7 cm hiatal hernia. - Normal stomach. - Normal duodenal bulb and second portion of the  duodenum. - No specimens collected. Comment: Incomplete healing of esophagitis. Stricture at GE junction dilated to 16.5 mm.    Review of Systems Past Medical History:  Diagnosis Date  . Anemia   . Arthritis   . Constipation   . Dysphagia 08/16/2016  . GERD (gastroesophageal reflux disease)     Past Surgical History:  Procedure Laterality Date  . ABDOMINAL HYSTERECTOMY    . APPENDECTOMY    . broken arm and wrist with plate and scews rt wrist    . CHOLECYSTECTOMY    . COLONOSCOPY    . complete hysterectomy'     fibroid tumors bleeding  . ESOPHAGEAL DILATION N/A 08/22/2016   Procedure: ESOPHAGEAL DILATION;  Surgeon: Rogene Houston, MD;  [facility];  Service: Endoscopy;   Laterality: N/A;  . ESOPHAGEAL DILATION N/A 09/14/2016   Procedure: ESOPHAGEAL DILATION;  Surgeon: Rogene Houston, MD;  [facility];  Service: Endoscopy;  Laterality: N/A;  . ESOPHAGEAL DILATION N/A 07/26/2017   Procedure: ESOPHAGEAL DILATION;  Surgeon: Rogene Houston, MD;  [facility];  Service: Endoscopy;  Laterality: N/A;  . ESOPHAGEAL DILATION N/A 08/31/2017   Procedure: ESOPHAGEAL DILATION;  Surgeon: Rogene Houston, MD;  [facility];  Service: Endoscopy;  Laterality: N/A;  . ESOPHAGEAL DILATION N/A 10/26/2017   Procedure: ESOPHAGEAL DILATION;  Surgeon: Rogene Houston, MD;  [facility];  Service: Endoscopy;  Laterality: N/A;  . ESOPHAGOGASTRODUODENOSCOPY N/A 08/22/2016   Procedure: ESOPHAGOGASTRODUODENOSCOPY (EGD);  Surgeon: Rogene Houston, MD;  [facility];  Service: Endoscopy;  Laterality: N/A;  . ESOPHAGOGASTRODUODENOSCOPY N/A 09/14/2016   Procedure: ESOPHAGOGASTRODUODENOSCOPY (EGD);  Surgeon: Rogene Houston, MD;  [facility];  Service: Endoscopy;  Laterality: N/A;  1225  . ESOPHAGOGASTRODUODENOSCOPY N/A 07/26/2017   Procedure: ESOPHAGOGASTRODUODENOSCOPY (EGD);  Surgeon: Rogene Houston, MD;  [facility];  Service: Endoscopy;  Laterality: N/A;  1:45  . ESOPHAGOGASTRODUODENOSCOPY N/A 08/31/2017   Procedure: ESOPHAGOGASTRODUODENOSCOPY (EGD);  Surgeon: Rogene Houston, MD;  [facility];  Service: Endoscopy;  Laterality: N/A;  . ESOPHAGOGASTRODUODENOSCOPY N/A 10/26/2017   Procedure: ESOPHAGOGASTRODUODENOSCOPY (EGD);  Surgeon: Rogene Houston, MD;  [facility];  Service: Endoscopy;  Laterality: N/A;  1115    Allergies  Allergen Reactions  . Penicillins Rash and Other (See Comments)    40 years ago, caused rash and tachycardia Has patient had  a PCN reaction causing immediate rash, facial/tongue/throat swelling, SOB or lightheadedness with hypotension: No Has patient had a PCN reaction  causing severe rash involving mucus membranes or skin necrosis: No Has patient had a PCN reaction that required hospitalization: No Has patient had a PCN reaction occurring within the last 10 years: No If all of the above answers are "NO", then may proceed with Cephalosporin use.     Current Outpatient Medications on File Prior to Visit  Medication Sig Dispense Refill  . aspirin EC 81 MG tablet Take 1 tablet (81 mg total) by mouth daily.    Marland Kitchen docusate sodium (COLACE) 100 MG capsule Take 100 mg by mouth 3 (three) times daily.     Marland Kitchen esomeprazole (NEXIUM) 40 MG capsule Take 1 capsule (40 mg total) by mouth 2 (two) times daily before a meal. (Patient taking differently: Take 20 mg by mouth daily. ) 60 capsule 5  . Flaxseed, Linseed, (FLAXSEED OIL) 1000 MG CAPS Take 1,000 mg by mouth daily.     . Garlic (GARLIQUE PO) Take 1 capsule by mouth daily.    . Magnesium 250 MG TABS Take 250 mg by mouth daily.     . metoCLOPramide (REGLAN) 10 MG tablet Take 1 tablet (10 mg total) by mouth at bedtime. 30 tablet 0  . Multiple Vitamins-Minerals (MULTI + OMEGA-3 ADULT GUMMIES) CHEW Chew 120 mg by mouth daily. 60mg  each    . Omega-3 Fatty Acids (FISH OIL) 1200 MG CAPS Take 1,200 mg by mouth daily.    Marland Kitchen trolamine salicylate (ASPERCREME) 10 % cream Apply 1 application topically as needed for muscle pain.    . TURMERIC CURCUMIN PO Take 500 mg by mouth daily.      No current facility-administered medications on file prior to visit.         Objective:   Physical Exam Blood pressure (!) 180/82, pulse 64, temperature 98.9 F (37.2 C), height 5' 5.5" (1.664 m), weight 174 lb 9.6 oz (79.2 kg).  Alert and oriented. Skin warm and dry. Oral mucosa is moist.   . Sclera anicteric, conjunctivae is pink. Thyroid not enlarged. No cervical lymphadenopathy. Lungs clear. Heart regular rate and rhythm.  Abdomen is soft. Bowel sounds are positive. No hepatomegaly. No abdominal masses felt. No tenderness.  No edema to lower  extremities.         Assessment & Plan:  Esophagitis. Continue the Nexium.] Dysphagia: Repeat EGD/ED in July.

## 2017-11-21 ENCOUNTER — Ambulatory Visit (HOSPITAL_COMMUNITY)
Admission: RE | Admit: 2017-11-21 | Discharge: 2017-11-21 | Disposition: A | Discharge status: Home / Self Care | Payer: Medicare HMO | Source: Ambulatory Visit | Attending: Internal Medicine | Admitting: Internal Medicine

## 2017-11-21 DIAGNOSIS — K222 Esophageal obstruction: Secondary | ICD-10-CM | POA: Diagnosis present

## 2017-11-21 DIAGNOSIS — Q396 Congenital diverticulum of esophagus: Secondary | ICD-10-CM | POA: Insufficient documentation

## 2017-11-21 DIAGNOSIS — R918 Other nonspecific abnormal finding of lung field: Secondary | ICD-10-CM | POA: Diagnosis not present

## 2017-11-21 DIAGNOSIS — K449 Diaphragmatic hernia without obstruction or gangrene: Secondary | ICD-10-CM | POA: Insufficient documentation

## 2017-11-21 LAB — POCT I-STAT, CHEM 8
BUN: 14 mg/dL (ref 8–23)
CALCIUM ION: 1.17 mmol/L (ref 1.15–1.40)
Chloride: 104 mmol/L (ref 98–111)
Creatinine, Ser: 1.3 mg/dL — ABNORMAL HIGH (ref 0.44–1.00)
GLUCOSE: 103 mg/dL — AB (ref 70–99)
HCT: 29 % — ABNORMAL LOW (ref 36.0–46.0)
HEMOGLOBIN: 9.9 g/dL — AB (ref 12.0–15.0)
POTASSIUM: 4.3 mmol/L (ref 3.5–5.1)
Sodium: 138 mmol/L (ref 135–145)
TCO2: 24 mmol/L (ref 22–32)

## 2017-11-21 MED ORDER — IOPAMIDOL (ISOVUE-300) INJECTION 61%
75.0000 mL | Freq: Once | INTRAVENOUS | Status: AC | PRN
  Administered 2017-11-21: 60 mL via INTRAVENOUS

## 2018-02-07 ENCOUNTER — Encounter (HOSPITAL_COMMUNITY)
Admission: RE | Disposition: A | Discharge status: Home / Self Care | Payer: Self-pay | Source: Ambulatory Visit | Attending: Internal Medicine

## 2018-02-07 ENCOUNTER — Encounter (HOSPITAL_COMMUNITY): Payer: Self-pay | Admitting: *Deleted

## 2018-02-07 ENCOUNTER — Ambulatory Visit (HOSPITAL_COMMUNITY)
Admission: RE | Admit: 2018-02-07 | Discharge: 2018-02-07 | Disposition: A | Discharge status: Home / Self Care | Payer: Medicare HMO | Source: Ambulatory Visit | Attending: Internal Medicine | Admitting: Internal Medicine

## 2018-02-07 ENCOUNTER — Other Ambulatory Visit: Payer: Self-pay

## 2018-02-07 DIAGNOSIS — K449 Diaphragmatic hernia without obstruction or gangrene: Secondary | ICD-10-CM | POA: Insufficient documentation

## 2018-02-07 DIAGNOSIS — Z79899 Other long term (current) drug therapy: Secondary | ICD-10-CM | POA: Diagnosis not present

## 2018-02-07 DIAGNOSIS — K210 Gastro-esophageal reflux disease with esophagitis: Secondary | ICD-10-CM | POA: Insufficient documentation

## 2018-02-07 DIAGNOSIS — K222 Esophageal obstruction: Secondary | ICD-10-CM

## 2018-02-07 DIAGNOSIS — Z7982 Long term (current) use of aspirin: Secondary | ICD-10-CM | POA: Insufficient documentation

## 2018-02-07 DIAGNOSIS — K228 Other specified diseases of esophagus: Secondary | ICD-10-CM

## 2018-02-07 HISTORY — PX: ESOPHAGEAL DILATION: SHX303

## 2018-02-07 HISTORY — PX: ESOPHAGOGASTRODUODENOSCOPY: SHX5428

## 2018-02-07 SURGERY — EGD (ESOPHAGOGASTRODUODENOSCOPY)
Anesthesia: Moderate Sedation

## 2018-02-07 MED ORDER — MEPERIDINE HCL 50 MG/ML IJ SOLN
INTRAMUSCULAR | Status: AC
  Filled 2018-02-07: qty 1

## 2018-02-07 MED ORDER — MIDAZOLAM HCL 5 MG/5ML IJ SOLN
INTRAMUSCULAR | Status: AC
  Filled 2018-02-07: qty 10

## 2018-02-07 MED ORDER — SODIUM CHLORIDE 0.9 % IV SOLN
INTRAVENOUS | Status: DC
  Administered 2018-02-07: 1000 mL via INTRAVENOUS

## 2018-02-07 MED ORDER — MEPERIDINE HCL 50 MG/ML IJ SOLN
INTRAMUSCULAR | Status: DC | PRN
  Administered 2018-02-07: 25 mg via INTRAVENOUS
  Administered 2018-02-07: 15 mg via INTRAVENOUS

## 2018-02-07 MED ORDER — LIDOCAINE VISCOUS HCL 2 % MT SOLN
OROMUCOSAL | Status: DC | PRN
  Administered 2018-02-07: 4 mL via OROMUCOSAL

## 2018-02-07 MED ORDER — STERILE WATER FOR IRRIGATION IR SOLN
Status: DC | PRN
  Administered 2018-02-07: 100 mL

## 2018-02-07 MED ORDER — LIDOCAINE VISCOUS HCL 2 % MT SOLN
OROMUCOSAL | Status: AC
  Filled 2018-02-07: qty 15

## 2018-02-07 MED ORDER — ESOMEPRAZOLE MAGNESIUM 20 MG PO CPDR: 20.0000 mg | DELAYED_RELEASE_CAPSULE | Freq: Two times a day (BID) | ORAL | Status: DC

## 2018-02-07 MED ORDER — MIDAZOLAM HCL 5 MG/5ML IJ SOLN
INTRAMUSCULAR | Status: DC | PRN
  Administered 2018-02-07 (×2): 2 mg via INTRAVENOUS

## 2018-02-07 NOTE — Discharge Instructions (Signed)
Resume aspirin on 02/10/2018. Take Nexium OTC 20 mg 30 minutes before breakfast and evening meal. Can take OTC antacid at bedtime as needed. Resume other medications as before. Resume usual diet. Repeat dilation as needed.  Esophagogastroduodenoscopy, Care After Refer to this sheet in the next few weeks. These instructions provide you with information about caring for yourself after your procedure. Your health care provider may also give you more specific instructions. Your treatment has been planned according to current medical practices, but problems sometimes occur. Call your health care provider if you have any problems or questions after your procedure. Dr Laural Golden:  [phone].  After hours and weekends call the hospital and have the GI doctor on call paged; they will call you back. What can I expect after the procedure? After the procedure, it is common to have:  A sore throat.  Nausea.  Bloating.  Dizziness.  Fatigue.  Follow these instructions at home:  Do not drive for 24 hours if you received a medicine to help you relax (sedative).  Keep all follow-up visits as told by your health care provider. This is important. Contact a health care provider if:  You cannot stop coughing.  You are not urinating.  You are urinating less than usual. Get help right away if:  You have trouble swallowing.  You cannot eat or drink.  You have throat or chest pain that gets worse.  You have nausea or vomiting.  You have chills.  You have a fever.  You have severe abdominal pain.  You have black, tarry, or bloody stools. This information is not intended to replace advice given to you by your health care provider. Make sure you discuss any questions you have with your health care provider. Document Released: 04/25/2012 Document Revised: 10/15/2015 Document Reviewed: 04/02/2015 Elsevier Interactive Patient Education  [employer].

## 2018-02-07 NOTE — H&P (Signed)
Valerie Griffin is an 84 y.o. female.   Chief Complaint: Patient is here for EGD and ED. HPI: Patient is 84 year old Caucasian female with chronic GERD who has known large sliding hiatal hernia and esophageal stricture due to GERD which has been difficult to treat.  Last dilation was about 3-1/2 months ago.  She states she has done better in the last 1 month.  Since her last dilation she had 2-3 episodes of food impaction relieved with regurgitation.  Her appetite is normal.  She has gained 5 pounds in the last 3 months.  No history of hematemesis melena or abdominal pain. On her last EGD of 10/26/2017 esophageal stricture was dilated only to 15 mm.  On prior endoscopy of 08/31/2017 was dilated to 16.5 mm. Last aspirin dose was 2 days ago  Past Medical History:  Diagnosis Date  . Anemia   . Arthritis   . Constipation   .  Esophageal stricture 08/16/2016  . GERD (gastroesophageal reflux disease)     Past Surgical History:  Procedure Laterality Date  . ABDOMINAL HYSTERECTOMY    . APPENDECTOMY    . broken arm and wrist with plate and scews rt wrist    . CHOLECYSTECTOMY    . COLONOSCOPY    . complete hysterectomy'     fibroid tumors bleeding  . ESOPHAGEAL DILATION N/A 08/22/2016   Procedure: ESOPHAGEAL DILATION;  Surgeon: Rogene Houston, MD;  Location: [facility];  Service: Endoscopy;  Laterality: N/A;  . ESOPHAGEAL DILATION N/A 09/14/2016   Procedure: ESOPHAGEAL DILATION;  Surgeon: Rogene Houston, MD;  Location: [facility];  Service: Endoscopy;  Laterality: N/A;  . ESOPHAGEAL DILATION N/A 07/26/2017   Procedure: ESOPHAGEAL DILATION;  Surgeon: Rogene Houston, MD;  Location: [facility];  Service: Endoscopy;  Laterality: N/A;  . ESOPHAGEAL DILATION N/A 08/31/2017   Procedure: ESOPHAGEAL DILATION;  Surgeon: Rogene Houston, MD;  [facility]: [facility];  Service: Endoscopy;  Laterality: N/A;  . ESOPHAGEAL DILATION N/A 10/26/2017   Procedure: ESOPHAGEAL DILATION;  Surgeon: Rogene Houston, MD;  [facility]: [facility];  Service: Endoscopy;  Laterality: N/A;  . ESOPHAGOGASTRODUODENOSCOPY N/A 08/22/2016   Procedure: ESOPHAGOGASTRODUODENOSCOPY (EGD);  Surgeon: Rogene Houston, MD;  [facility]: [facility];  Service: Endoscopy;  Laterality: N/A;  . ESOPHAGOGASTRODUODENOSCOPY N/A 09/14/2016   Procedure: ESOPHAGOGASTRODUODENOSCOPY (EGD);  Surgeon: Rogene Houston, MD;  [facility]: [facility];  Service: Endoscopy;  Laterality: N/A;  1225  . ESOPHAGOGASTRODUODENOSCOPY N/A 07/26/2017   Procedure: ESOPHAGOGASTRODUODENOSCOPY (EGD);  Surgeon: Rogene Houston, MD;  [facility]: [facility];  Service: Endoscopy;  Laterality: N/A;  1:45  . ESOPHAGOGASTRODUODENOSCOPY N/A 08/31/2017   Procedure: ESOPHAGOGASTRODUODENOSCOPY (EGD);  Surgeon: Rogene Houston, MD;  [facility]: [facility];  Service: Endoscopy;  Laterality: N/A;  . ESOPHAGOGASTRODUODENOSCOPY N/A 10/26/2017   Procedure: ESOPHAGOGASTRODUODENOSCOPY (EGD);  Surgeon: Rogene Houston, MD;  Location: [facility];  Service: Endoscopy;  Laterality: N/A;  65    Family History  Problem Relation Age of Onset  . Heart Problems Mother   . Alzheimer's disease Mother   . Hypertension Mother   . Prostate cancer Father   . Heart attack Father   . Heart Problems Sister    Social History:  reports that she has never smoked. She has never used smokeless tobacco. She reports that she does not drink alcohol or use drugs.  Allergies:  Allergies  Allergen Reactions  . Penicillins Rash and Other (See Comments)  40 years ago, caused rash and tachycardia Has patient had a PCN reaction causing immediate rash, facial/tongue/throat swelling, SOB or lightheadedness with hypotension: No Has patient had a PCN reaction causing severe rash involving mucus membranes or skin necrosis: No Has patient had a PCN reaction that required hospitalization: No Has patient had a PCN reaction occurring within the last 10 years: No If all of the above answers are "NO",  then may proceed with Cephalosporin use.     Medications Prior to Admission  Medication Sig Dispense Refill  . aspirin EC 81 MG tablet Take 1 tablet (81 mg total) by mouth daily.    Marland Kitchen docusate sodium (COLACE) 100 MG capsule Take 300 mg by mouth at bedtime.     Marland Kitchen esomeprazole (NEXIUM) 20 MG capsule Take 20 mg by mouth daily at 12 noon.    . Ferrous Sulfate (IRON) 28 MG TABS Take 1 tablet by mouth daily.    . Flaxseed, Linseed, (FLAXSEED OIL) 1000 MG CAPS Take 1,000 mg by mouth daily.     . Garlic (GARLIQUE PO) Take 1 capsule by mouth daily.    . Magnesium 250 MG TABS Take 250 mg by mouth daily.     . Multiple Vitamins-Minerals (MULTI + OMEGA-3 ADULT GUMMIES) CHEW Chew 120 mg by mouth daily. 60mg  each    . trolamine salicylate (ASPERCREME) 10 % cream Apply 1 application topically as needed for muscle pain.    . TURMERIC CURCUMIN PO Take 500 mg by mouth daily.     . metoCLOPramide (REGLAN) 10 MG tablet Take 1 tablet (10 mg total) by mouth at bedtime. (Patient not taking: Reported on 12/07/2017) 30 tablet 0    No results found for this or any previous visit (from the past 48 hour(s)). No results found.  ROS  Blood pressure (!) 163/65, pulse 73, temperature 98.4 F (36.9 C), temperature source Oral, resp. rate 18, height 5' 5.5" (1.664 m), weight 80.3 kg, SpO2 100 %. Physical Exam  Constitutional: She appears well-developed and well-nourished.  HENT:  Mouth/Throat: Oropharynx is clear and moist.  Eyes: Conjunctivae are normal. No scleral icterus.  Neck: No thyromegaly present.  Cardiovascular: Normal rate and normal heart sounds.  Respiratory: Effort normal and breath sounds normal.  GI: Soft. She exhibits no distension and no mass. There is no tenderness.  Musculoskeletal: She exhibits no edema.  Neurological: She is alert.  Skin: Skin is warm and dry.     Assessment/Plan Esophageal stricture complicated by chronic GERD. EGD with ED.  Hildred Laser, MD 02/07/2018, 12:09  PM

## 2018-02-07 NOTE — Op Note (Signed)
[facility] Patient Name: Valerie Griffin Procedure Date: 02/07/2018 11:42 AM MRN: [id number] Date of Birth: [birth year] Attending MD: Hildred Laser , MD CSN: [id number] Age: 84 Admit Type: Outpatient Procedure:                Upper GI endoscopy Indications:              Therapeutic procedure, For therapy of esophageal                            stenosis Providers:                Hildred Laser, MD, Rosina Lowenstein, RN, Randa Spike, Technician Referring MD:             Glenda Chroman, MD Medicines:                Lidocaine spray, Meperidine 40 mg IV, Midazolam 4                            mg IV Complications:            No immediate complications. Estimated Blood Loss:     Estimated blood loss was minimal. Procedure:                Pre-Anesthesia Assessment:                           - Prior to the procedure, a History and Physical                            was performed, and patient medications and                            allergies were reviewed. The patient's tolerance of                            previous anesthesia was also reviewed. The risks                            and benefits of the procedure and the sedation                            options and risks were discussed with the patient.                            All questions were answered, and informed consent                            was obtained. Prior Anticoagulants: The patient                            last took aspirin 2 days prior to the procedure.  ASA Grade Assessment: II - A patient with mild                            systemic disease. After reviewing the risks and                            benefits, the patient was deemed in satisfactory                            condition to undergo the procedure.                           After obtaining informed consent, the endoscope was                            passed under direct vision. Throughout the                           procedure, the patient's blood pressure, pulse, and                            oxygen saturations were monitored continuously. The                            [id number]-H190 ([id number]) scope was introduced through the                            mouth, and advanced to the second part of duodenum.                            The upper GI endoscopy was accomplished without                            difficulty. The patient tolerated the procedure                            well. Scope In: 12:19:05 PM Scope Out: 12:26:28 PM Total Procedure Duration: 0 hours 7 minutes 23 seconds  Findings:      The proximal esophagus was normal.      LA Grade D (one or more mucosal breaks involving at least 75% of       esophageal circumference) esophagitis with no bleeding was found 26 to       31 cm from the incisors.      One benign-appearing, intrinsic moderate (circumferential scarring or       stenosis; an endoscope may pass) stenosis was found 30 to 31 cm from the       incisors. This stenosis measured 1 cm (inner diameter) x 1 cm (in       length). The stenosis was traversed. The dilation site was examined and       showed mild mucosal disruption, moderate improvement in luminal       narrowing and no perforation. Estimated blood loss was minimal.      The Z-line was irregular and was found 31 cm from the incisors.      A 7  cm hiatal hernia was present.      The entire examined stomach was normal.      The duodenal bulb and second portion of the duodenum were normal. Impression:               - Normal proximal esophagus.                           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                           - Benign-appearing esophageal stenosis.                           - Z-line irregular, 31 cm from the incisors.                           - 7 cm hiatal hernia.                           - Normal stomach.                            - Normal duodenal bulb and second portion of the                            duodenum.                           - No specimens collected. Moderate Sedation:      Moderate (conscious) sedation was administered by the endoscopy nurse       and supervised by the endoscopist. The following parameters were       monitored: oxygen saturation, heart rate, blood pressure, CO2       capnography and response to care. Total physician intraservice time was       12 minutes. Recommendation:           - Patient has a contact number available for                            emergencies. The signs and symptoms of potential                            delayed complications were discussed with the                            patient. Return to normal activities tomorrow.                            Written discharge instructions were provided to the                            patient.                           -  Resume previous diet today.                           - Continue present medications.                           - Resume aspirin at prior dose in 3 days.                           - Repeat upper endoscopy PRN. Procedure Code(s):        --- Professional ---                           [phone], Esophagogastroduodenoscopy, flexible,                            transoral; diagnostic, including collection of                            specimen(s) by brushing or washing, when performed                            (separate procedure)                           G0500, Moderate sedation services provided by the                            same physician or other qualified health care                            professional performing a gastrointestinal                            endoscopic service that sedation supports,                            requiring the presence of an independent trained                            observer to assist in the monitoring of the                            patient's  level of consciousness and physiological                            status; initial 15 minutes of intra-service time;                            patient age 54 years or older (additional time may                            be reported with [phone], as appropriate) Diagnosis Code(s):        --- Professional ---  K21.0, Gastro-esophageal reflux disease with                            esophagitis                           K22.2, Esophageal obstruction                           K22.8, Other specified diseases of esophagus                           K44.9, Diaphragmatic hernia without obstruction or                            gangrene CPT copyright 2017 [employer]. All rights reserved. The codes documented in this report are preliminary and upon coder review may  be revised to meet current compliance requirements. Hildred Laser, MD Hildred Laser, MD 02/07/2018 12:40:33 PM This report has been signed electronically. Number of Addenda: 0

## 2018-02-12 ENCOUNTER — Encounter (HOSPITAL_COMMUNITY): Payer: Self-pay | Admitting: Internal Medicine

## 2019-02-02 IMAGING — RF DG ESOPHAGUS
6 series · 14 of 20 positions shown · non-contrast
Comparison: None.

CLINICAL DATA: Dysphagia.

EXAM:
ESOPHOGRAM / BARIUM SWALLOW / BARIUM TABLET STUDY
TECHNIQUE: Combined double contrast and single contrast examination performed
using effervescent crystals, thick barium liquid, and thin barium
liquid. The patient was observed with fluoroscopy swallowing a 13 mm
barium sulphate tablet.
FLUOROSCOPY TIME:  Radiation Exposure Index (if provided by the
fluoroscopic device): 17.7 mGy.

[Series 1: cp_standard · 0.17mm/px · 3 of 11 frames shown (1 of 6)]
[frame 2/11]
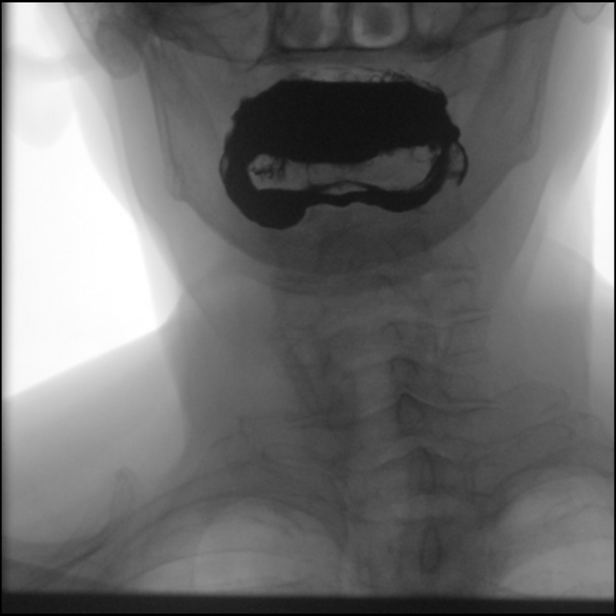
[frame 8/11]
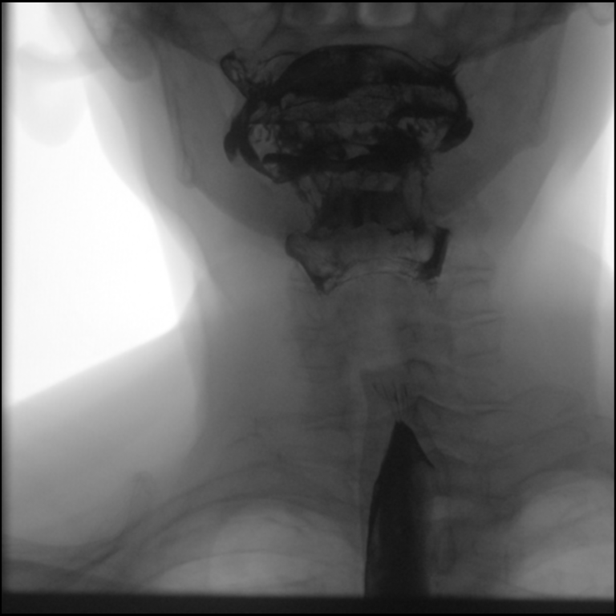
[frame 10/11]
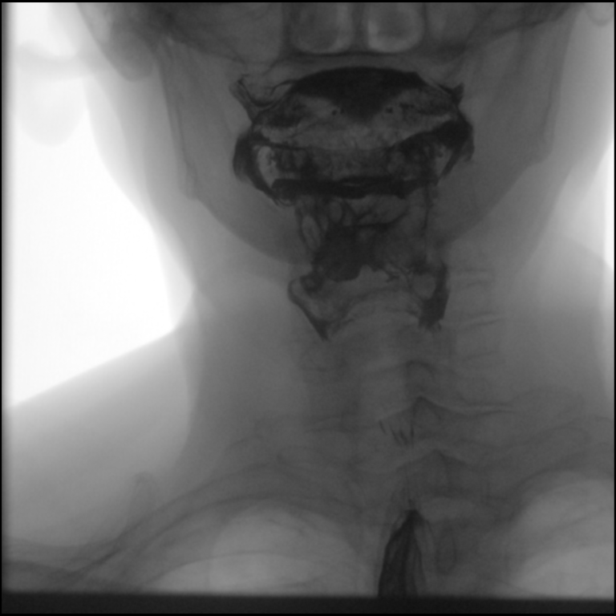

[Series 2: cp_standard · 0.17mm/px · 2 of 12 frames shown (2 of 6)]
[frame 7/12]
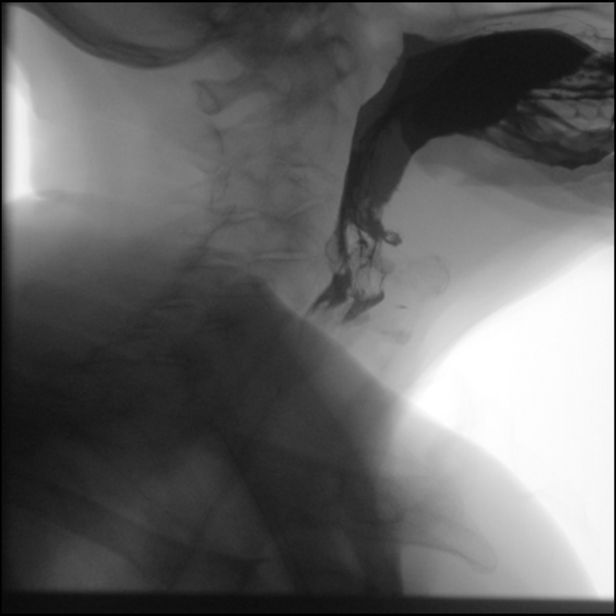
[frame 11/12]
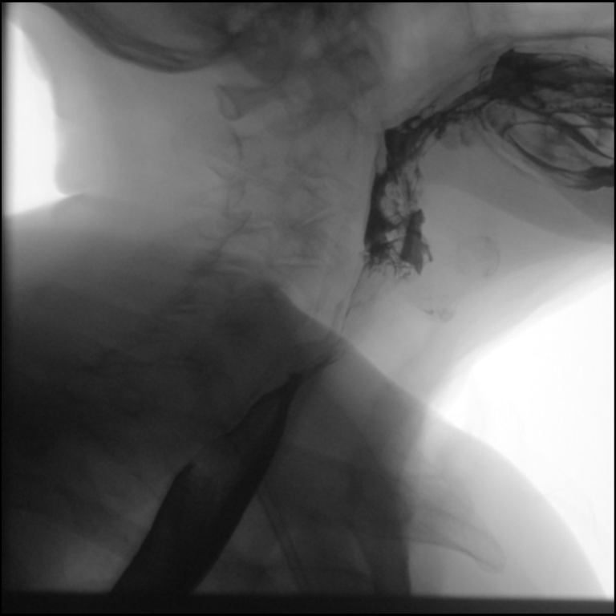

[Series 3: cp_standard · 0.25mm/px · 3 of 233 frames shown (3 of 6)]
[frame 35/233]
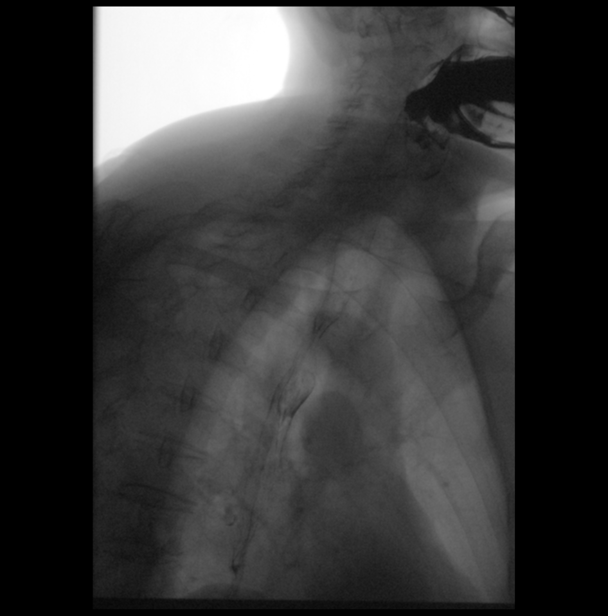
[frame 137/233]
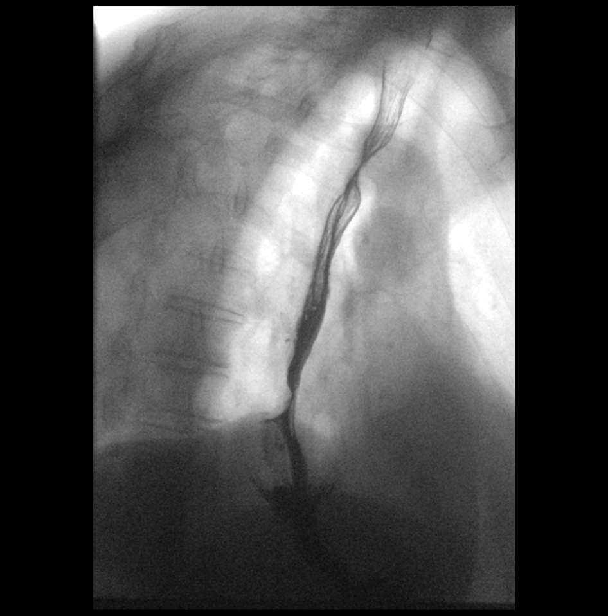
[frame 199/233]
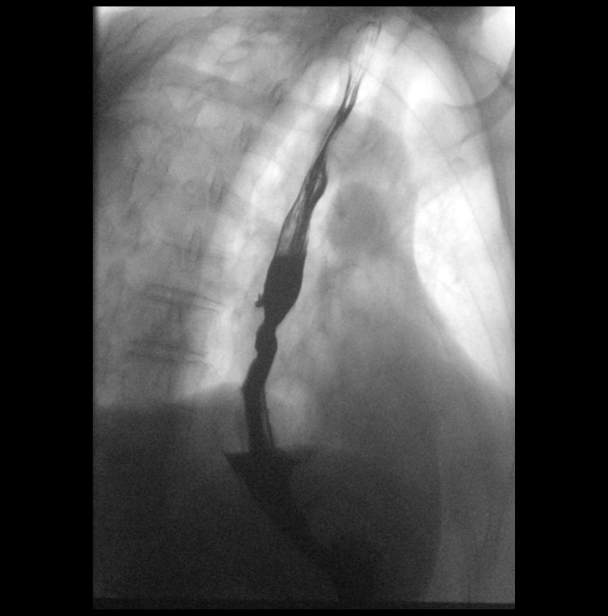

[Series 4: cp_standard · 0.26mm/px · 2 of 386 frames shown (4 of 6)]
[frame 104/386]
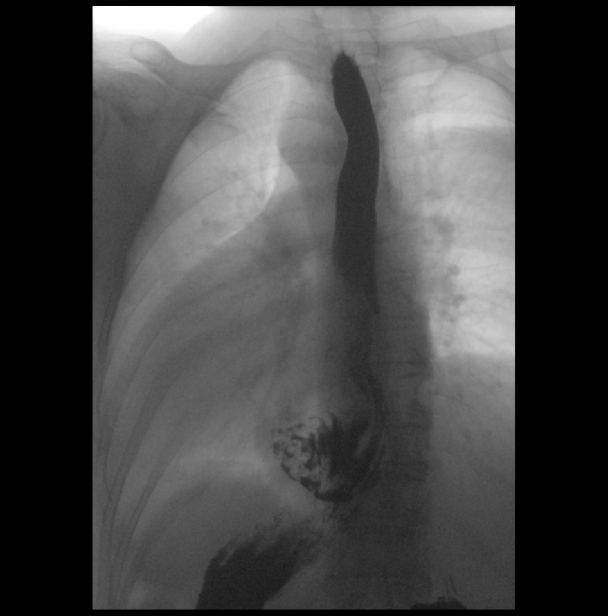
[frame 194/386]
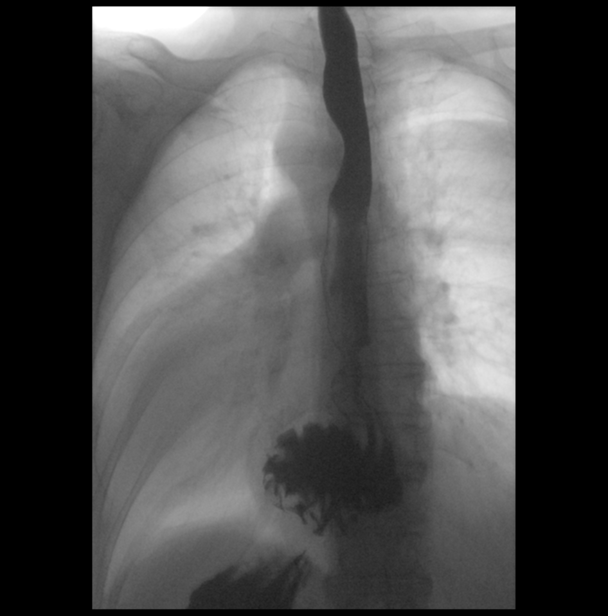

[Series 5: cp_standard · 0.17mm/px · 3 of 163 frames shown (5 of 6)]
[frame 25/163]
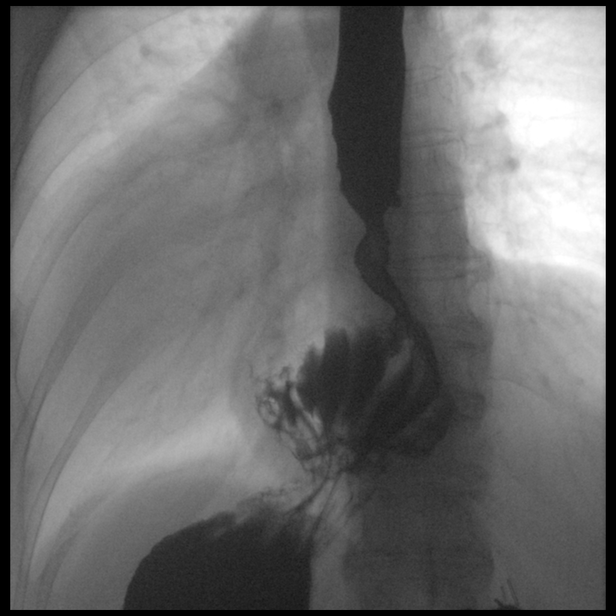
[frame 82/163]
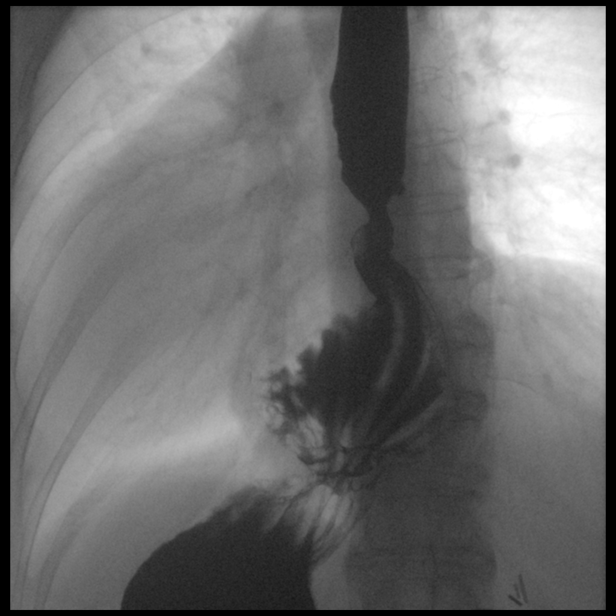
[frame 105/163]
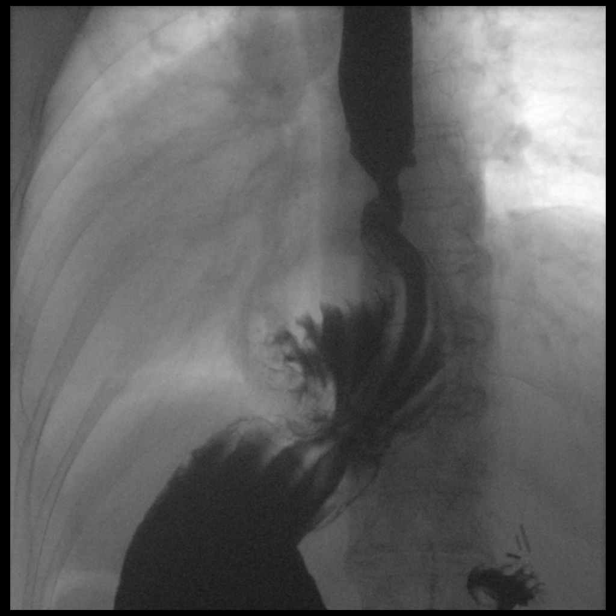

[Series 6: cp_standard · 0.17mm/px · 1 of 1 slices shown (6 of 6)]
[im 1/1]
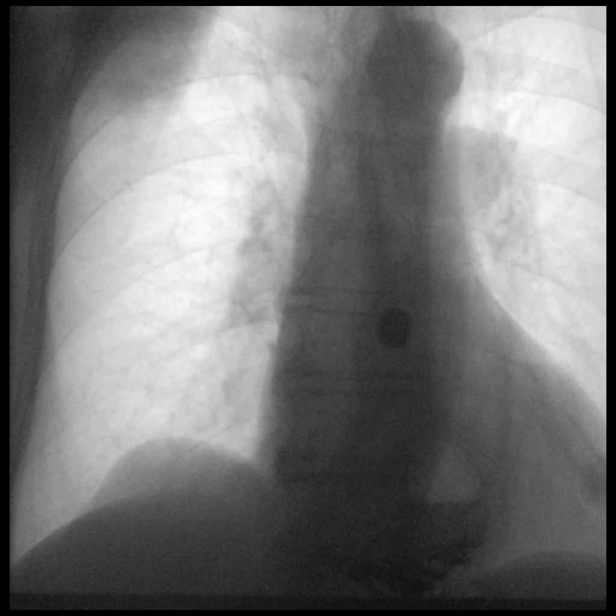

[14 of 20 positions shown; findings below may reference images not displayed]

FINDINGS: Small diverticulum is seen arising posteriorly from the mid thoracic
esophagus. Large sliding-type hiatal hernia is noted. Irregular
fixed stricture is seen involving the distal esophagus just above
the gastroesophageal junction which is concerning for esophageal
malignancy. Barium tablet was administered and was not seen to
progress beyond this stricture more distally.
IMPRESSION: Large sliding-type hiatal hernia.

Small diverticulum is seen arising from the mid thoracic esophagus.

Irregular fixed stricture is seen involving the distal esophagus
just above the gastroesophageal junction, which is concerning for
esophageal malignancy. Endoscopy is recommended for further
evaluation. These results will be called to the ordering clinician
or representative by the Radiologist Assistant, and communication
documented in the PACS or zVision Dashboard.

## 2019-05-09 ENCOUNTER — Other Ambulatory Visit (INDEPENDENT_AMBULATORY_CARE_PROVIDER_SITE_OTHER): Payer: Self-pay | Admitting: *Deleted

## 2019-05-09 ENCOUNTER — Ambulatory Visit (INDEPENDENT_AMBULATORY_CARE_PROVIDER_SITE_OTHER): Payer: Medicare HMO | Admitting: Nurse Practitioner

## 2019-05-09 ENCOUNTER — Encounter (INDEPENDENT_AMBULATORY_CARE_PROVIDER_SITE_OTHER): Payer: Self-pay | Admitting: Nurse Practitioner

## 2019-05-09 ENCOUNTER — Other Ambulatory Visit: Payer: Self-pay

## 2019-05-09 ENCOUNTER — Encounter (INDEPENDENT_AMBULATORY_CARE_PROVIDER_SITE_OTHER): Payer: Self-pay | Admitting: *Deleted

## 2019-05-09 VITALS — BP 156/73 | HR 86 | Temp 97.3°F | Ht 65.5 in | Wt 187.5 lb

## 2019-05-09 DIAGNOSIS — K921 Melena: Secondary | ICD-10-CM

## 2019-05-09 DIAGNOSIS — R1310 Dysphagia, unspecified: Secondary | ICD-10-CM | POA: Diagnosis not present

## 2019-05-09 DIAGNOSIS — R1319 Other dysphagia: Secondary | ICD-10-CM

## 2019-05-09 DIAGNOSIS — D509 Iron deficiency anemia, unspecified: Secondary | ICD-10-CM

## 2019-05-09 NOTE — Patient Instructions (Signed)
We are checking labs today and scheduling an upper endoscopy for evaluation.  Will call with results.

## 2019-05-09 NOTE — Progress Notes (Signed)
Patient profile: Valerie Griffin is a 85 y.o. female seen for evaluation of dark stools (self referred). Last seen in clinic on 10/2017 for esophagitis.    History of Present Illness: Valerie Griffin is seen today for dark stools, she first noted dark stools in November for 1 to 2 days when she was diagnosed with Covid and quarentining.  These have continued on intermittent basis.  She last saw dark stool 2 days ago.  She had a normal-appearing stool yesterday.She denies any bright red blood per rectum. She denies any abdominal pain with the dark stools.  She does use NSAIDs twice a week on an empty stomach.  Previously had issues with constipation requiring Colace but since Covid has been having a soft stool daily and stopped Colace. When has dark stools consistency can vary from formed to tarry like.   Long history of dysphagia with multiple endoscopies last year as below.  She reports her dysphagia has slightly worsened compared to her last endoscopy, she avoids most solid foods currently but is also having issues with soft food such as mandrin oranges.  She is having to regurgitate foods most days. She does not have issues with liquids.  Some issues with larger pills.  Eats a lot of things such as ice cream and boost.  Denies odynophagia.   As long as she takes her reflux medication 20 mg twice a day she does not feel indigestion symptoms.  Reports she had Covid November 2020 and main symptoms were fatigue, runny nose, she feels symptoms have completely resolved.   Wt Readings from Last 3 Encounters:  05/09/19 187 lb 8 oz (85 kg)  02/07/18 177 lb (80.3 kg)  11/15/17 174 lb 9.6 oz (79.2 kg)     Last Colonoscopy: 11/2009-5 mm polyp mid transverse colon pathology with tubular adenoma.  Last Endoscopy: 01/2018--grade D esophagitis, benign appearing moderate esophageal stenosis, z line irregular, 7cm HH, normal stomach  10/2017--grade D esophagitis. Severe intrinsic stenosis dilated. Large  HH  08/2017 EGD--grade D esophagitis, stenosis 1.1cm dilated 16.15mm, 7cm HH, portal hypertensive gastropathy  07/2017--severe stenosis dilated to 35mm w/ balloon. 7cm HH, portal hypertensive gastropathy.    Past Medical History:  Past Medical History:  Diagnosis Date  . Anemia   . Arthritis   . Constipation   . Dysphagia 08/16/2016  . GERD (gastroesophageal reflux disease)     Problem List: Patient Active Problem List   Diagnosis Date Noted  . Esophageal stricture 10/27/2017  . Stricture esophagus 09/06/2017  . Esophageal stenosis 06/20/2017  . Esophageal dysphagia 08/18/2016  . Abnormal esophagram 08/18/2016  . GERD (gastroesophageal reflux disease) 08/16/2016  . Constipation 08/16/2016  . Dysphagia 08/16/2016    Past Surgical History: Past Surgical History:  Procedure Laterality Date  . ABDOMINAL HYSTERECTOMY    . APPENDECTOMY    . broken arm and wrist with plate and scews rt wrist    . CHOLECYSTECTOMY    . COLONOSCOPY    . complete hysterectomy'     fibroid tumors bleeding  . ESOPHAGEAL DILATION N/A 08/22/2016   Procedure: ESOPHAGEAL DILATION;  Surgeon: Rogene Houston, MD;  [facility];  Service: Endoscopy;  Laterality: N/A;  . ESOPHAGEAL DILATION N/A 09/14/2016   Procedure: ESOPHAGEAL DILATION;  Surgeon: Rogene Houston, MD;  [facility];  Service: Endoscopy;  Laterality: N/A;  . ESOPHAGEAL DILATION N/A 07/26/2017   Procedure: ESOPHAGEAL DILATION;  Surgeon: Rogene Houston, MD;  [facility];  Service:  Endoscopy;  Laterality: N/A;  . ESOPHAGEAL DILATION N/A 08/31/2017   Procedure: ESOPHAGEAL DILATION;  Surgeon: Rogene Houston, MD;  [facility];  Service: Endoscopy;  Laterality: N/A;  . ESOPHAGEAL DILATION N/A 10/26/2017   Procedure: ESOPHAGEAL DILATION;  Surgeon: Rogene Houston, MD;  [facility];  Service: Endoscopy;  Laterality: N/A;  . ESOPHAGEAL DILATION N/A 02/07/2018   Procedure: ESOPHAGEAL DILATION;   Surgeon: Rogene Houston, MD;  [facility];  Service: Endoscopy;  Laterality: N/A;  . ESOPHAGOGASTRODUODENOSCOPY N/A 08/22/2016   Procedure: ESOPHAGOGASTRODUODENOSCOPY (EGD);  Surgeon: Rogene Houston, MD;  [facility];  Service: Endoscopy;  Laterality: N/A;  . ESOPHAGOGASTRODUODENOSCOPY N/A 09/14/2016   Procedure: ESOPHAGOGASTRODUODENOSCOPY (EGD);  Surgeon: Rogene Houston, MD;  [facility];  Service: Endoscopy;  Laterality: N/A;  1225  . ESOPHAGOGASTRODUODENOSCOPY N/A 07/26/2017   Procedure: ESOPHAGOGASTRODUODENOSCOPY (EGD);  Surgeon: Rogene Houston, MD;  [facility];  Service: Endoscopy;  Laterality: N/A;  1:45  . ESOPHAGOGASTRODUODENOSCOPY N/A 08/31/2017   Procedure: ESOPHAGOGASTRODUODENOSCOPY (EGD);  Surgeon: Rogene Houston, MD;  [facility];  Service: Endoscopy;  Laterality: N/A;  . ESOPHAGOGASTRODUODENOSCOPY N/A 10/26/2017   Procedure: ESOPHAGOGASTRODUODENOSCOPY (EGD);  Surgeon: Rogene Houston, MD;  [facility];  Service: Endoscopy;  Laterality: N/A;  1115  . ESOPHAGOGASTRODUODENOSCOPY N/A 02/07/2018   Procedure: ESOPHAGOGASTRODUODENOSCOPY (EGD);  Surgeon: Rogene Houston, MD;  [facility];  Service: Endoscopy;  Laterality: N/A;  830    Allergies: Allergies  Allergen Reactions  . Penicillins Rash and Other (See Comments)    40 years ago, caused rash and tachycardia Has patient had a PCN reaction causing immediate rash, facial/tongue/throat swelling, SOB or lightheadedness with hypotension: No Has patient had a PCN reaction causing severe rash involving mucus membranes or skin necrosis: No Has patient had a PCN reaction that required hospitalization: No Has patient had a PCN reaction occurring within the last 10 years: No If all of the above answers are "NO", then may proceed with Cephalosporin use.       Home Medications:  Current Outpatient Medications:  .  aspirin EC 81 MG tablet, Take 1 tablet  (81 mg total) by mouth daily., Disp: , Rfl:  .  BLACK ELDERBERRY PO, Take by mouth. Patient states that she takes 2 a day, Disp: , Rfl:  .  Cholecalciferol (VITAMIN D3) 125 MCG (5000 UT) TABS, Take by mouth daily., Disp: , Rfl:  .  esomeprazole (NEXIUM) 20 MG capsule, Take 1 capsule (20 mg total) by mouth 2 (two) times daily before a meal., Disp: , Rfl:  .  Ferrous Sulfate (IRON) 28 MG TABS, Take 1 tablet by mouth daily., Disp: , Rfl:  .  Multiple Vitamins-Minerals (MULTI + OMEGA-3 ADULT GUMMIES) CHEW, Chew 120 mg by mouth daily. 60mg  each, Disp: , Rfl:  .  trolamine salicylate (ASPERCREME) 10 % cream, Apply 1 application topically as needed for muscle pain., Disp: , Rfl:  .  TURMERIC CURCUMIN PO, Take 500 mg by mouth daily. , Disp: , Rfl:  .  docusate sodium (COLACE) 100 MG capsule, Take 300 mg by mouth at bedtime. , Disp: , Rfl:    Family History: family history includes Alzheimer's disease in her mother; Heart Problems in her mother and sister; Heart attack in her father; Hypertension in her mother; Prostate cancer in her father.    Social History:   reports that she has never smoked. She has never used  smokeless tobacco. She reports that she does not drink alcohol or use drugs.   Review of Systems: Constitutional: Denies weight loss/weight gain  Eyes: No changes in vision. ENT: No oral lesions, sore throat.  GI: see HPI.  Heme/Lymph: No easy bruising.  CV: No chest pain.  GU: No hematuria.  Integumentary: No rashes.  Neuro: No headaches.  Psych: No depression/anxiety.  Endocrine: No heat/cold intolerance.  Allergic/Immunologic: No urticaria.  Resp: Shortness of breath with exertion. Musculoskeletal: No joint swelling.    Physical Examination: BP (!) 156/73 (BP Location: Left Arm, Patient Position: Sitting, Cuff Size: Normal)   Pulse 86   Temp (!) 97.3 F (36.3 C) (Oral)   Ht 5' 5.5" (1.664 m)   Wt 187 lb 8 oz (85 kg)   BMI 30.73 kg/m  Gen: NAD, alert and oriented x  4 HEENT: PEERLA, EOMI, Neck: supple, no JVD Chest: CTA bilaterally, no wheezes, crackles, or other adventitious sounds CV: RRR, no m/g/c/r Abd: soft, NT, ND, +BS in all four quadrants; no HSM, guarding, ridigity, or rebound tenderness Rectal exam in office-faintly positive Hemoccult, brown stool. Ext: no edema, well perfused with 2+ pulses, Skin: no rash or lesions noted on observed skin Lymph: no noted LAD  Data: Lab Results  Component Value Date   HGB 9.9 (L) 11/21/2017   HCT 29.0 (L) 11/21/2017   No results for input(s): HGB in the last 168 hours. Lab Results  Component Value Date   NA 138 11/21/2017   K 4.3 11/21/2017   CL 104 11/21/2017   BUN 14 11/21/2017   CREATININE 1.30 (H) 11/21/2017   Labs requested from [facility] during visit-hemoglobin March 13, 2019 was 6.9.  MCV 87.  Platelets normal.  Received 2 units of blood.   Assessment/Plan: Ms. Ernzen is a 85 y.o. female     1.  Anemia-reports her hemoglobin has ranged around 7, limited records available for review.  Last CBC available with hemoglobin 6.9 and received 2 units approximately 2 months ago.  We will repeat labs today.  Rectal exam today without obvious melena, faintly heme positive brown stool.  She does take iron supplements somewhat intermittently.  We will start with upper endoscopy given her history, differential includes peptic ulcer disease, risk of esophagitis, Cameron ulcerations, AVM, etc.  Reviewed avoiding NSAIDs especially on empty stomach.  Hold aspirin 2 days prior to procedure   2.  Dysphagia-history of severe esophagitis despite PPI therapy.  Diet modifications reviewed.  She will continue PPI.  Endoscopy with possible dilation scheduled.  Denies prior issues with anesthesia.    Chelci was seen today for follow-up.  Diagnoses and all orders for this visit:  Iron deficiency anemia, unspecified iron deficiency anemia type -     CBC -     Ferritin -     Fe+TIBC+Fer -     Procedural/  Surgical Case Request: ESOPHAGOGASTRODUODENOSCOPY (EGD), ESOPHAGEAL DILATION; Standing -     Procedural/ Surgical Case Request: ESOPHAGOGASTRODUODENOSCOPY (EGD), ESOPHAGEAL DILATION -     COMPLETE METABOLIC PANEL WITH GFR  Esophageal dysphagia -     Procedural/ Surgical Case Request: ESOPHAGOGASTRODUODENOSCOPY (EGD), ESOPHAGEAL DILATION; Standing -     Procedural/ Surgical Case Request: ESOPHAGOGASTRODUODENOSCOPY (EGD), ESOPHAGEAL DILATION -     COMPLETE METABOLIC PANEL WITH GFR  Melena -     Procedural/ Surgical Case Request: ESOPHAGOGASTRODUODENOSCOPY (EGD), ESOPHAGEAL DILATION; Standing -     Procedural/ Surgical Case Request: ESOPHAGOGASTRODUODENOSCOPY (EGD), ESOPHAGEAL DILATION -     COMPLETE METABOLIC PANEL  WITH GFR     Recommendations:  1. IDA- baseline Hgb around 7, limited records available from [facility], will request additional records. Check CBC, iron studies, CMP today. Needs EGD initially, also due for colonoscopy based on hx of polyps in 2011 if EGD unremarkable for source of bleed would consider. May need transfusion prior to EGD depending on today's Hgb (pending).   2. Dysphagia - long hx of severe esophagitis & stenosis, continue ppi and diet modifications. Advised against nsaids on empty stomach. No GERD sx.    Patient denies CP, SOB, and use of blood thinners. I discussed the risks and benefits of procedure including bleeding, perforation, infection, missed lesions, medication reactions and possible hospitalization or surgery if complications. All questions answered.   I personally performed the service, non-incident to. (WP)  Laurine Blazer [facility]  I agree with the above assessment and plan [facility] CRNP

## 2019-05-10 LAB — COMPLETE METABOLIC PANEL WITH GFR
AG Ratio: 1.3 (calc) (ref 1.0–2.5)
ALT: 9 U/L (ref 6–29)
AST: 19 U/L (ref 10–35)
Albumin: 3.9 g/dL (ref 3.6–5.1)
Alkaline phosphatase (APISO): 87 U/L (ref 37–153)
BUN/Creatinine Ratio: 12 (calc) (ref 6–22)
BUN: 12 mg/dL (ref 7–25)
CO2: 23 mmol/L (ref 20–32)
Calcium: 9.2 mg/dL (ref 8.6–10.4)
Chloride: 107 mmol/L (ref 98–110)
Creat: 1.02 mg/dL — ABNORMAL HIGH (ref 0.60–0.88)
GFR, Est African American: 59 mL/min/{1.73_m2} — ABNORMAL LOW (ref >=60)
GFR, Est Non African American: 51 mL/min/{1.73_m2} — ABNORMAL LOW (ref >=60)
Globulin: 2.9 g/dL (calc) (ref 1.9–3.7)
Glucose, Bld: 93 mg/dL (ref 65–139)
Potassium: 4.3 mmol/L (ref 3.5–5.3)
Sodium: 141 mmol/L (ref 135–146)
Total Bilirubin: 0.5 mg/dL (ref 0.2–1.2)
Total Protein: 6.8 g/dL (ref 6.1–8.1)

## 2019-05-10 LAB — IRON,TIBC AND FERRITIN PANEL
%SAT: 4 % (calc) — ABNORMAL LOW (ref 16–45)
Ferritin: 9 ng/mL — ABNORMAL LOW (ref 16–288)
Iron: 15 ug/dL — ABNORMAL LOW (ref 45–160)
TIBC: 364 mcg/dL (calc) (ref 250–450)

## 2019-05-10 LAB — CBC
HCT: 26.5 % — ABNORMAL LOW (ref 35.0–45.0)
Hemoglobin: 8.1 g/dL — ABNORMAL LOW (ref 11.7–15.5)
MCH: 25.1 pg — ABNORMAL LOW (ref 27.0–33.0)
MCHC: 30.6 g/dL — ABNORMAL LOW (ref 32.0–36.0)
MCV: 82 fL (ref 80.0–100.0)
MPV: 10.3 fL (ref 7.5–12.5)
Platelets: 334 10*3/uL (ref 140–400)
RBC: 3.23 10*6/uL — ABNORMAL LOW (ref 3.80–5.10)
RDW: 16.5 % — ABNORMAL HIGH (ref 11.0–15.0)
WBC: 5.9 10*3/uL (ref 3.8–10.8)

## 2019-05-21 ENCOUNTER — Encounter (INDEPENDENT_AMBULATORY_CARE_PROVIDER_SITE_OTHER): Payer: Self-pay | Admitting: *Deleted

## 2019-05-28 ENCOUNTER — Telehealth (INDEPENDENT_AMBULATORY_CARE_PROVIDER_SITE_OTHER): Payer: Self-pay | Admitting: *Deleted

## 2019-05-28 NOTE — Telephone Encounter (Signed)
I would check with Dr Laural Golden - her dysphagia was fairly symptomatic at appt 2 weeks ago (only mainly tolerating liquids and very soft foods) and her hemoglobin fairly low (8.1) so may want to go ahead and do EGD this week and plan for colonoscopy later. Thanks!

## 2019-05-28 NOTE — Patient Instructions (Signed)
Valerie Griffin  05/28/2019     @PREFPERIOPPHARMACY @   Your procedure is scheduled on  05/30/2018 .  Report to [facility] at  1300  (1:00)  PM.  Call this number if you have problems the morning of surgery:  [phone]   Remember:  Follow the diet instructions given to you by Dr Olevia Perches office.                  Take these medicines the morning of surgery with A SIP OF WATER  Nexium, reglan.    Do not wear jewelry, make-up or nail polish.  Do not wear lotions, powders, or perfumes. Please wear deodorant and brush your teeth.  Do not shave 48 hours prior to surgery.  Men may shave face and neck.  Do not bring valuables to the hospital.  [facility] is not responsible for any belongings or valuables.  Contacts, dentures or bridgework may not be worn into surgery.  Leave your suitcase in the car.  After surgery it may be brought to your room.  For patients admitted to the hospital, discharge time will be determined by your treatment team.  Patients discharged the day of surgery will not be allowed to drive home.   Name and phone number of your driver:   family Special instructions:  None  Please read over the following fact sheets that you were given. Anesthesia Post-op Instructions and Care and Recovery After Surgery       Upper Endoscopy, Adult, Care After This sheet gives you information about how to care for yourself after your procedure. Your health care provider may also give you more specific instructions. If you have problems or questions, contact your health care provider. What can I expect after the procedure? After the procedure, it is common to have:  A sore throat.  Mild stomach pain or discomfort.  Bloating.  Nausea. Follow these instructions at home:   Follow instructions from your health care provider about what to eat or drink after your procedure.  Return to your normal activities as told by your health care provider. Ask your health  care provider what activities are safe for you.  Take over-the-counter and prescription medicines only as told by your health care provider.  Do not drive for 24 hours if you were given a sedative during your procedure.  Keep all follow-up visits as told by your health care provider. This is important. Contact a health care provider if you have:  A sore throat that lasts longer than one day.  Trouble swallowing. Get help right away if:  You vomit blood or your vomit looks like coffee grounds.  You have: ? A fever. ? Bloody, black, or tarry stools. ? A severe sore throat or you cannot swallow. ? Difficulty breathing. ? Severe pain in your chest or abdomen. Summary  After the procedure, it is common to have a sore throat, mild stomach discomfort, bloating, and nausea.  Do not drive for 24 hours if you were given a sedative during the procedure.  Follow instructions from your health care provider about what to eat or drink after your procedure.  Return to your normal activities as told by your health care provider. This information is not intended to replace advice given to you by your health care provider. Make sure you discuss any questions you have with your health care provider. Document Revised: 10/31/2017 Document Reviewed: 10/09/2017 Elsevier Patient Education  [phone]  [employer]  Esophageal Dilatation Esophageal dilatation, also called esophageal dilation, is a procedure to widen or open (dilate) a blocked or narrowed part of the esophagus. The esophagus is the part of the body that moves food and liquid from the mouth to the stomach. You may need this procedure if:  You have a buildup of scar tissue in your esophagus that makes it difficult, painful, or impossible to swallow. This can be caused by gastroesophageal reflux disease (GERD).  You have cancer of the esophagus.  There is a problem with how food moves through your esophagus. In some cases, you may need this  procedure repeated at a later time to dilate the esophagus gradually. Tell a health care provider about:  Any allergies you have.  All medicines you are taking, including vitamins, herbs, eye drops, creams, and over-the-counter medicines.  Any problems you or family members have had with anesthetic medicines.  Any blood disorders you have.  Any surgeries you have had.  Any medical conditions you have.  Any antibiotic medicines you are required to take before dental procedures.  Whether you are pregnant or may be pregnant. What are the risks? Generally, this is a safe procedure. However, problems may occur, including:  Bleeding due to a tear in the lining of the esophagus.  A hole (perforation) in the esophagus. What happens before the procedure?  Follow instructions from your health care provider about eating or drinking restrictions.  Ask your health care provider about changing or stopping your regular medicines. This is especially important if you are taking diabetes medicines or blood thinners.  Plan to have someone take you home from the hospital or clinic.  Plan to have a responsible adult care for you for at least 24 hours after you leave the hospital or clinic. This is important. What happens during the procedure?  You may be given a medicine to help you relax (sedative).  A numbing medicine may be sprayed into the back of your throat, or you may gargle the medicine.  Your health care provider may perform the dilatation using various surgical instruments, such as: ? Simple dilators. This instrument is carefully placed in the esophagus to stretch it. ? Guided wire bougies. This involves using an endoscope to insert a wire into the esophagus. A dilator is passed over this wire to enlarge the esophagus. Then the wire is removed. ? Balloon dilators. An endoscope with a small balloon at the end is inserted into the esophagus. The balloon is inflated to stretch the  esophagus and open it up. The procedure may vary among health care providers and hospitals. What happens after the procedure?  Your blood pressure, heart rate, breathing rate, and blood oxygen level will be monitored until the medicines you were given have worn off.  Your throat may feel slightly sore and numb. This will improve slowly over time.  You will not be allowed to eat or drink until your throat is no longer numb.  When you are able to drink, urinate, and sit on the edge of the bed without nausea or dizziness, you may be able to return home. Follow these instructions at home:  Take over-the-counter and prescription medicines only as told by your health care provider.  Do not drive for 24 hours if you were given a sedative during your procedure.  You should have a responsible adult with you for 24 hours after the procedure.  Follow instructions from your health care provider about any eating or drinking  restrictions.  Do not use any products that contain nicotine or tobacco, such as cigarettes and e-cigarettes. If you need help quitting, ask your health care provider.  Keep all follow-up visits as told by your health care provider. This is important. Get help right away if you:  Have a fever.  Have chest pain.  Have pain that is not relieved by medication.  Have trouble breathing.  Have trouble swallowing.  Vomit blood. Summary  Esophageal dilatation, also called esophageal dilation, is a procedure to widen or open (dilate) a blocked or narrowed part of the esophagus.  Plan to have someone take you home from the hospital or clinic.  For this procedure, a numbing medicine may be sprayed into the back of your throat, or you may gargle the medicine.  Do not drive for 24 hours if you were given a sedative during your procedure. This information is not intended to replace advice given to you by your health care provider. Make sure you discuss any questions you have  with your health care provider. Document Revised: 03/06/2019 Document Reviewed: 03/14/2017 Elsevier Patient Education  2020 [place] After These instructions provide you with information about caring for yourself after your procedure. Your health care provider may also give you more specific instructions. Your treatment has been planned according to current medical practices, but problems sometimes occur. Call your health care provider if you have any problems or questions after your procedure. What can I expect after the procedure? After your procedure, you may:  Feel sleepy for several hours.  Feel clumsy and have poor balance for several hours.  Feel forgetful about what happened after the procedure.  Have poor judgment for several hours.  Feel nauseous or vomit.  Have a sore throat if you had a breathing tube during the procedure. Follow these instructions at home: For at least 24 hours after the procedure:      Have a responsible adult stay with you. It is important to have someone help care for you until you are awake and alert.  Rest as needed.  Do not: ? Participate in activities in which you could fall or become injured. ? Drive. ? Use heavy machinery. ? Drink alcohol. ? Take sleeping pills or medicines that cause drowsiness. ? Make important decisions or sign legal documents. ? Take care of children on your own. Eating and drinking  Follow the diet that is recommended by your health care provider.  If you vomit, drink water, juice, or soup when you can drink without vomiting.  Make sure you have little or no nausea before eating solid foods. General instructions  Take over-the-counter and prescription medicines only as told by your health care provider.  If you have sleep apnea, surgery and certain medicines can increase your risk for breathing problems. Follow instructions from your health care provider about wearing  your sleep device: ? Anytime you are sleeping, including during daytime naps. ? While taking prescription pain medicines, sleeping medicines, or medicines that make you drowsy.  If you smoke, do not smoke without supervision.  Keep all follow-up visits as told by your health care provider. This is important. Contact a health care provider if:  You keep feeling nauseous or you keep vomiting.  You feel light-headed.  You develop a rash.  You have a fever. Get help right away if:  You have trouble breathing. Summary  For several hours after your procedure, you may feel sleepy and have poor judgment.  Have a responsible adult stay with you for at least 24 hours or until you are awake and alert. This information is not intended to replace advice given to you by your health care provider. Make sure you discuss any questions you have with your health care provider. Document Revised: 08/07/2017 Document Reviewed: 08/30/2015 Elsevier Patient Education  [place].

## 2019-05-28 NOTE — Telephone Encounter (Signed)
Patient is on recall for 10 yr TCS, she will bring her questionnaire by office tomorrow so I can schedule TCS - she is scheduled for EGD poss ED with propofol this Friday, if patient is agreeable to changing date will it be ok to reschedule so we can do both procedures at same time

## 2019-05-28 NOTE — Telephone Encounter (Signed)
Will leave EGD poss Ed on for this Friday and schedule TCS at later date

## 2019-05-29 ENCOUNTER — Other Ambulatory Visit: Payer: Self-pay

## 2019-05-29 ENCOUNTER — Encounter (HOSPITAL_COMMUNITY)
Admission: RE | Admit: 2019-05-29 | Discharge: 2019-05-29 | Disposition: A | Discharge status: Home / Self Care | Payer: Medicare HMO | Source: Ambulatory Visit | Attending: Internal Medicine | Admitting: Internal Medicine

## 2019-05-29 ENCOUNTER — Encounter (HOSPITAL_COMMUNITY): Payer: Self-pay

## 2019-05-29 ENCOUNTER — Other Ambulatory Visit (HOSPITAL_COMMUNITY)
Admission: RE | Admit: 2019-05-29 | Discharge: 2019-05-29 | Disposition: A | Discharge status: Home / Self Care | Payer: Medicare HMO | Source: Ambulatory Visit | Attending: Internal Medicine | Admitting: Internal Medicine

## 2019-05-29 DIAGNOSIS — Z20822 Contact with and (suspected) exposure to covid-19: Secondary | ICD-10-CM | POA: Diagnosis not present

## 2019-05-29 DIAGNOSIS — K921 Melena: Secondary | ICD-10-CM

## 2019-05-29 DIAGNOSIS — D509 Iron deficiency anemia, unspecified: Secondary | ICD-10-CM

## 2019-05-29 DIAGNOSIS — R1319 Other dysphagia: Secondary | ICD-10-CM

## 2019-05-29 DIAGNOSIS — Z01812 Encounter for preprocedural laboratory examination: Secondary | ICD-10-CM | POA: Insufficient documentation

## 2019-05-29 DIAGNOSIS — R1310 Dysphagia, unspecified: Secondary | ICD-10-CM

## 2019-05-29 LAB — CBC WITH DIFFERENTIAL/PLATELET
Abs Immature Granulocytes: 0.01 10*3/uL (ref 0.00–0.07)
Basophils Absolute: 0 10*3/uL (ref 0.0–0.1)
Basophils Relative: 1 %
Eosinophils Absolute: 0.3 10*3/uL (ref 0.0–0.5)
Eosinophils Relative: 5 %
HCT: 27.9 % — ABNORMAL LOW (ref 36.0–46.0)
Hemoglobin: 8 g/dL — ABNORMAL LOW (ref 12.0–15.0)
Immature Granulocytes: 0 %
Lymphocytes Relative: 22 %
Lymphs Abs: 1.4 10*3/uL (ref 0.7–4.0)
MCH: 25.1 pg — ABNORMAL LOW (ref 26.0–34.0)
MCHC: 28.7 g/dL — ABNORMAL LOW (ref 30.0–36.0)
MCV: 87.5 fL (ref 80.0–100.0)
Monocytes Absolute: 0.4 10*3/uL (ref 0.1–1.0)
Monocytes Relative: 6 %
Neutro Abs: 4 10*3/uL (ref 1.7–7.7)
Neutrophils Relative %: 66 %
Platelets: 367 10*3/uL (ref 150–400)
RBC: 3.19 MIL/uL — ABNORMAL LOW (ref 3.87–5.11)
RDW: 19.3 % — ABNORMAL HIGH (ref 11.5–15.5)
WBC: 6.1 10*3/uL (ref 4.0–10.5)
nRBC: 0 % (ref 0.0–0.2)

## 2019-05-29 LAB — SARS CORONAVIRUS 2 (TAT 6-24 HRS): SARS Coronavirus 2: NEGATIVE

## 2019-05-30 NOTE — Pre-Procedure Instructions (Signed)
Labs sent to Dr Laural Golden.

## 2019-05-30 NOTE — Progress Notes (Signed)
Pt's procedure time changed to 1200. Pt notified to arrive for check in at 1030 for 1200 procedure. Voiced understanding.

## 2019-05-31 ENCOUNTER — Other Ambulatory Visit: Payer: Self-pay

## 2019-05-31 ENCOUNTER — Ambulatory Visit (HOSPITAL_COMMUNITY): Payer: Medicare HMO | Admitting: Anesthesiology

## 2019-05-31 ENCOUNTER — Encounter (HOSPITAL_COMMUNITY)
Admission: RE | Disposition: A | Discharge status: Home / Self Care | Payer: Self-pay | Source: Home / Self Care | Attending: Internal Medicine

## 2019-05-31 ENCOUNTER — Encounter (HOSPITAL_COMMUNITY): Payer: Self-pay | Admitting: Internal Medicine

## 2019-05-31 ENCOUNTER — Ambulatory Visit (HOSPITAL_COMMUNITY)
Admission: RE | Admit: 2019-05-31 | Discharge: 2019-05-31 | Disposition: A | Discharge status: Home / Self Care | Payer: Medicare HMO | Attending: Internal Medicine | Admitting: Internal Medicine

## 2019-05-31 DIAGNOSIS — K222 Esophageal obstruction: Secondary | ICD-10-CM | POA: Insufficient documentation

## 2019-05-31 DIAGNOSIS — K209 Esophagitis, unspecified without bleeding: Secondary | ICD-10-CM

## 2019-05-31 DIAGNOSIS — X58XXXA Exposure to other specified factors, initial encounter: Secondary | ICD-10-CM | POA: Insufficient documentation

## 2019-05-31 DIAGNOSIS — Z7982 Long term (current) use of aspirin: Secondary | ICD-10-CM | POA: Insufficient documentation

## 2019-05-31 DIAGNOSIS — Z880 Allergy status to penicillin: Secondary | ICD-10-CM | POA: Insufficient documentation

## 2019-05-31 DIAGNOSIS — T18128A Food in esophagus causing other injury, initial encounter: Secondary | ICD-10-CM

## 2019-05-31 DIAGNOSIS — R1310 Dysphagia, unspecified: Secondary | ICD-10-CM

## 2019-05-31 DIAGNOSIS — M1990 Unspecified osteoarthritis, unspecified site: Secondary | ICD-10-CM | POA: Insufficient documentation

## 2019-05-31 DIAGNOSIS — Z79899 Other long term (current) drug therapy: Secondary | ICD-10-CM | POA: Diagnosis not present

## 2019-05-31 DIAGNOSIS — R1314 Dysphagia, pharyngoesophageal phase: Secondary | ICD-10-CM | POA: Insufficient documentation

## 2019-05-31 DIAGNOSIS — K449 Diaphragmatic hernia without obstruction or gangrene: Secondary | ICD-10-CM

## 2019-05-31 DIAGNOSIS — R1319 Other dysphagia: Secondary | ICD-10-CM

## 2019-05-31 DIAGNOSIS — K921 Melena: Secondary | ICD-10-CM

## 2019-05-31 DIAGNOSIS — D509 Iron deficiency anemia, unspecified: Secondary | ICD-10-CM | POA: Insufficient documentation

## 2019-05-31 DIAGNOSIS — K219 Gastro-esophageal reflux disease without esophagitis: Secondary | ICD-10-CM | POA: Diagnosis not present

## 2019-05-31 DIAGNOSIS — K2100 Gastro-esophageal reflux disease with esophagitis, without bleeding: Secondary | ICD-10-CM | POA: Insufficient documentation

## 2019-05-31 HISTORY — PX: FOREIGN BODY REMOVAL: SHX962

## 2019-05-31 HISTORY — PX: ESOPHAGOGASTRODUODENOSCOPY (EGD) WITH PROPOFOL: SHX5813

## 2019-05-31 HISTORY — PX: ESOPHAGEAL DILATION: SHX303

## 2019-05-31 SURGERY — ESOPHAGOGASTRODUODENOSCOPY (EGD) WITH PROPOFOL
Anesthesia: General

## 2019-05-31 MED ORDER — PROPOFOL 500 MG/50ML IV EMUL
INTRAVENOUS | Status: DC | PRN
  Administered 2019-05-31: 150 ug/kg/min via INTRAVENOUS

## 2019-05-31 MED ORDER — KETAMINE HCL 50 MG/5ML IJ SOSY
PREFILLED_SYRINGE | INTRAMUSCULAR | Status: AC
  Filled 2019-05-31: qty 5

## 2019-05-31 MED ORDER — PROMETHAZINE HCL 25 MG/ML IJ SOLN: 6.2500 mg | INTRAMUSCULAR | Status: DC | PRN

## 2019-05-31 MED ORDER — HYDROCODONE-ACETAMINOPHEN 7.5-325 MG PO TABS: 1.0000 | ORAL_TABLET | Freq: Once | ORAL | Status: DC | PRN

## 2019-05-31 MED ORDER — LACTATED RINGERS IV SOLN: INTRAVENOUS | Status: DC

## 2019-05-31 MED ORDER — MIDAZOLAM HCL 2 MG/2ML IJ SOLN: 0.5000 mg | Freq: Once | INTRAMUSCULAR | Status: DC | PRN

## 2019-05-31 MED ORDER — CHLORHEXIDINE GLUCONATE CLOTH 2 % EX PADS: 6.0000 | MEDICATED_PAD | Freq: Once | CUTANEOUS | Status: DC

## 2019-05-31 MED ORDER — HYDROMORPHONE HCL 1 MG/ML IJ SOLN: 0.2500 mg | INTRAMUSCULAR | Status: DC | PRN

## 2019-05-31 MED ORDER — KETAMINE HCL 10 MG/ML IJ SOLN
INTRAMUSCULAR | Status: DC | PRN
  Administered 2019-05-31: 20 mg via INTRAVENOUS

## 2019-05-31 NOTE — Transfer of Care (Signed)
Immediate Anesthesia Transfer of Care Note  Patient: Valerie Griffin  Procedure(s) Performed: ESOPHAGOGASTRODUODENOSCOPY (EGD) WITH PROPOFOL (N/A ) ESOPHAGEAL DILATION (N/A ) FOREIGN BODY REMOVAL  Patient Location: PACU  Anesthesia Type:MAC  Level of Consciousness: awake, alert , oriented and patient cooperative  Airway & Oxygen Therapy: Patient Spontanous Breathing  Post-op Assessment: Report given to RN and Post -op Vital signs reviewed and stable  Post vital signs: Reviewed and stable  Last Vitals:  Vitals Value Taken Time  BP    Temp    Pulse    Resp    SpO2      Last Pain:  Vitals:   05/31/19 1206  TempSrc:   PainSc: 0-No pain      Patients Stated Pain Goal: 8 (57/[id number])  Complications: No apparent anesthesia complications

## 2019-05-31 NOTE — Discharge Instructions (Signed)
No aspirin or NSAIDs for 3 days. Resume usual medications as before. Mechanical soft diet or consider pured diet. No driving for 24 hours. Repeat dilation in 2 weeks.

## 2019-05-31 NOTE — Anesthesia Postprocedure Evaluation (Signed)
Anesthesia Post Note  Patient: Valerie Griffin  Procedure(s) Performed: ESOPHAGOGASTRODUODENOSCOPY (EGD) WITH PROPOFOL (N/A ) ESOPHAGEAL DILATION (N/A ) FOREIGN BODY REMOVAL  Patient location during evaluation: PACU Anesthesia Type: MAC Level of consciousness: awake, oriented, awake and alert and patient cooperative Pain management: pain level controlled Vital Signs Assessment: post-procedure vital signs reviewed and stable Respiratory status: spontaneous breathing, respiratory function stable and nonlabored ventilation Cardiovascular status: stable Postop Assessment: no apparent nausea or vomiting Anesthetic complications: no     Last Vitals:  Vitals:   05/31/19 1046  BP: (!) 149/64  Pulse: 75  Resp: (!) 24  Temp: 36.9 C    Last Pain:  Vitals:   05/31/19 1206  TempSrc:   PainSc: 0-No pain                 Keyera Hattabaugh

## 2019-05-31 NOTE — Anesthesia Preprocedure Evaluation (Signed)
Anesthesia Evaluation  Patient identified by MRN, date of birth, ID band Patient awake    Reviewed: Allergy & Precautions, NPO status , Patient's Chart, lab work & pertinent test results  Airway Mallampati: II  TM Distance: >3 FB Neck ROM: Full    Dental no notable dental hx. (+) Edentulous Upper, Edentulous Lower   Pulmonary neg pulmonary ROS,    Pulmonary exam normal breath sounds clear to auscultation       Cardiovascular Exercise Tolerance: Poor negative cardio ROS Normal cardiovascular examI Rhythm:Regular Rate:Normal  States limited by bad knee,also sever anemia   Neuro/Psych negative neurological ROS  negative psych ROS   GI/Hepatic Neg liver ROS, GERD  Medicated and Controlled,Here for EGD-dysphagia /IDA   Endo/Other  negative endocrine ROS  Renal/GU negative Renal ROS  negative genitourinary   Musculoskeletal  (+) Arthritis , Osteoarthritis,    Abdominal   Peds negative pediatric ROS (+)  Hematology negative hematology ROS (+) anemia , Last H/H 8/27.9 States has received 3 transfusions in the last 18 months  Last was 02/2019-2 units    Anesthesia Other Findings   Reproductive/Obstetrics negative OB ROS                             Anesthesia Physical Anesthesia Plan  ASA: III  Anesthesia Plan: General   Post-op Pain Management:    Induction: Intravenous  PONV Risk Score and Plan: 3 and TIVA, Propofol infusion, Ondansetron and Treatment may vary due to age or medical condition  Airway Management Planned: Simple Face Mask and Nasal Cannula  Additional Equipment:   Intra-op Plan:   Post-operative Plan:   Informed Consent: I have reviewed the patients History and Physical, chart, labs and discussed the procedure including the risks, benefits and alternatives for the proposed anesthesia with the patient or authorized representative who has indicated his/her  understanding and acceptance.     Dental advisory given  Plan Discussed with: CRNA  Anesthesia Plan Comments: (Plan Full PPE use  Plan GA with GETA as needed d/w pt -WTP with same after Q&A)        Anesthesia Quick Evaluation

## 2019-05-31 NOTE — Op Note (Signed)
[facility] Patient Name: Valerie Griffin Procedure Date: 05/31/2019 11:42 AM MRN: [id number] Date of Birth: May 16, 1934 Attending MD: Hildred Laser , MD CSN: [id number] Age: 86 Admit Type: Outpatient Procedure:                Upper GI endoscopy Indications:              Esophageal dysphagia, Melena Providers:                Hildred Laser, MD, Otis Peak B. Sharon Seller, RN, Raphael Gibney, Technician Referring MD:             Glenda Chroman Medicines:                Propofol per Anesthesia Complications:            No immediate complications. Estimated Blood Loss:     Estimated blood loss was minimal. Procedure:                Pre-Anesthesia Assessment:                           - Prior to the procedure, a History and Physical                            was performed, and patient medications and                            allergies were reviewed. The patient's tolerance of                            previous anesthesia was also reviewed. The risks                            and benefits of the procedure and the sedation                            options and risks were discussed with the patient.                            All questions were answered, and informed consent                            was obtained. Prior Anticoagulants: The patient has                            taken no previous anticoagulant or antiplatelet                            agents except for aspirin. ASA Grade Assessment: II                            - A patient with mild systemic disease. After  reviewing the risks and benefits, the patient was                            deemed in satisfactory condition to undergo the                            procedure.                           After obtaining informed consent, the endoscope was                            passed under direct vision. Throughout the                            procedure, the patient's blood  pressure, pulse, and                            oxygen saturations were monitored continuously. The                            [id number]-H190 [id number] scope was introduced through the                            mouth, and advanced to the second part of duodenum.                            The upper GI endoscopy was accomplished without                            difficulty. The patient tolerated the procedure                            well. Scope In: 12:12:33 PM Scope Out: 12:22:23 PM Total Procedure Duration: 0 hours 9 minutes 50 seconds  Findings:      The proximal esophagus was normal.      Food was found in the distal esophagus. Removal of food was accomplished.      LA Grade C (one or more mucosal breaks continuous between tops of 2 or       more mucosal folds, less than 75% circumference) esophagitis with no       bleeding was found 27 to 29 cm from the incisors.      One benign-appearing, intrinsic severe stenosis was found 29 cm from the       incisors. This stenosis measured less than one cm (in length). The       stenosis was not traversed. A TTS dilator was passed through the scope.       Dilation with a 12-13.5-15 mm balloon dilator was performed to 12 mm and       13.5 mm. The dilation site was examined and showed mild mucosal       disruption, mild improvement in luminal narrowing and no perforation.      An 8 cm hiatal hernia was present.      The entire examined stomach was normal.      The duodenal bulb and  second portion of the duodenum were normal. Impression:               - Normal proximal esophagus.                           - Food in the distal esophagus. Removal was                            successful.                           - LA Grade C esophagitis with no bleeding.                           - Benign-appearing esophageal stenosis. Dilated.                           - 8 cm hiatal hernia.                           - Normal stomach.                           -  Normal duodenal bulb and second portion of the                            duodenum. Moderate Sedation:      Per Anesthesia Care Recommendation:           - Patient has a contact number available for                            emergencies. The signs and symptoms of potential                            delayed complications were discussed with the                            patient. Return to normal activities tomorrow.                            Written discharge instructions were provided to the                            patient.                           - Mechanical soft diet today.                           - Continue present medications.                           - No aspirin, ibuprofen, naproxen, or other                            non-steroidal anti-inflammatory drugs for 3 days.                           -  Repeat upper endoscopy in 2 weeks. Procedure Code(s):        --- Professional ---                           [phone], Esophagogastroduodenoscopy, flexible,                            transoral; with removal of foreign body(s)                           43249, Esophagogastroduodenoscopy, flexible,                            transoral; with transendoscopic balloon dilation of                            esophagus (less than 30 mm diameter) Diagnosis Code(s):        --- Professional ---                           [id number], Food in esophagus causing other injury,                            initial encounter                           K20.90, Esophagitis, unspecified without bleeding                           K22.2, Esophageal obstruction                           K44.9, Diaphragmatic hernia without obstruction or                            gangrene                           R13.14, Dysphagia, pharyngoesophageal phase                           K92.1, Melena (includes Hematochezia) CPT copyright 2019 [employer]. All rights reserved. The codes documented in this report  are preliminary and upon coder review may  be revised to meet current compliance requirements. Hildred Laser, MD Hildred Laser, MD 05/31/2019 12:42:00 PM This report has been signed electronically. Number of Addenda: 0

## 2019-05-31 NOTE — H&P (Signed)
Valerie Griffin is an 86 y.o. female.   Chief Complaint: Patient is here for esophagogastroduodenoscopy with esophageal dilation. HPI: Patient is 86 year old Caucasian female with chronic GERD large hiatal hernia who has distal esophageal stricture and history of grade D reflux esophagitis.  She has required multiple dilations in the past.  Last dilation was in September 2019.  Stricture was only dilated to 15 mm with the balloon.  She says she has been managing until about a month ago and she is having difficulty swallowing solids.  She said during the summer she lost few pounds because she is not able to eat ice cream.  She has gained all this weight back.  She will has had tarry stools.  She is also noted to have iron deficiency anemia.  No history of hematemesis or rectal bleeding.  Past Medical History:  Diagnosis Date  . Anemia   . Arthritis   . Constipation   .  Esophageal stricture 08/16/2016  . GERD (gastroesophageal reflux disease)     Past Surgical History:  Procedure Laterality Date  . ABDOMINAL HYSTERECTOMY    . APPENDECTOMY    . broken arm and wrist with plate and scews rt wrist Right   . CHOLECYSTECTOMY    . COLONOSCOPY    . complete hysterectomy'     fibroid tumors bleeding  . ESOPHAGEAL DILATION N/A 08/22/2016   Procedure: ESOPHAGEAL DILATION;  Surgeon: Rogene Houston, MD;  [facility];  Service: Endoscopy;  Laterality: N/A;  . ESOPHAGEAL DILATION N/A 09/14/2016   Procedure: ESOPHAGEAL DILATION;  Surgeon: Rogene Houston, MD;  [facility]: [facility];  Service: Endoscopy;  Laterality: N/A;  . ESOPHAGEAL DILATION N/A 07/26/2017   Procedure: ESOPHAGEAL DILATION;  Surgeon: Rogene Houston, MD;  [facility]: [facility];  Service: Endoscopy;  Laterality: N/A;  . ESOPHAGEAL DILATION N/A 08/31/2017   Procedure: ESOPHAGEAL DILATION;  Surgeon: Rogene Houston, MD;  [facility];  Service: Endoscopy;  Laterality: N/A;  . ESOPHAGEAL DILATION N/A 10/26/2017   Procedure: ESOPHAGEAL DILATION;  Surgeon: Rogene Houston, MD;  [facility];  Service: Endoscopy;  Laterality: N/A;  . ESOPHAGEAL DILATION N/A 02/07/2018   Procedure: ESOPHAGEAL DILATION;  Surgeon: Rogene Houston, MD;  [facility];  Service: Endoscopy;  Laterality: N/A;  . ESOPHAGOGASTRODUODENOSCOPY N/A 08/22/2016   Procedure: ESOPHAGOGASTRODUODENOSCOPY (EGD);  Surgeon: Rogene Houston, MD;  [facility];  Service: Endoscopy;  Laterality: N/A;  . ESOPHAGOGASTRODUODENOSCOPY N/A 09/14/2016   Procedure: ESOPHAGOGASTRODUODENOSCOPY (EGD);  Surgeon: Rogene Houston, MD;  [facility];  Service: Endoscopy;  Laterality: N/A;  1225  . ESOPHAGOGASTRODUODENOSCOPY N/A 07/26/2017   Procedure: ESOPHAGOGASTRODUODENOSCOPY (EGD);  Surgeon: Rogene Houston, MD;  [facility];  Service: Endoscopy;  Laterality: N/A;  1:45  . ESOPHAGOGASTRODUODENOSCOPY N/A 08/31/2017   Procedure: ESOPHAGOGASTRODUODENOSCOPY (EGD);  Surgeon: Rogene Houston, MD;  [facility];  Service: Endoscopy;  Laterality: N/A;  . ESOPHAGOGASTRODUODENOSCOPY N/A 10/26/2017   Procedure: ESOPHAGOGASTRODUODENOSCOPY (EGD);  Surgeon: Rogene Houston, MD;  [facility];  Service: Endoscopy;  Laterality: N/A;  1115  . ESOPHAGOGASTRODUODENOSCOPY N/A 02/07/2018   Procedure: ESOPHAGOGASTRODUODENOSCOPY (EGD);  Surgeon: Rogene Houston, MD;  [facility];  Service: Endoscopy;  Laterality: N/A;  26    Family History  Problem Relation Age of Onset  . Heart Problems Mother   . Alzheimer's disease Mother   . Hypertension Mother   . Prostate cancer  Father   . Heart attack Father   . Heart Problems Sister    Social History:  reports that she has never smoked. She has never used smokeless tobacco. She reports that she does not drink alcohol or use drugs.  Allergies:  Allergies  Allergen Reactions  . Penicillins Rash and Other (See Comments)    40 years ago, caused rash  and tachycardia Has patient had a PCN reaction causing immediate rash, facial/tongue/throat swelling, SOB or lightheadedness with hypotension: No Has patient had a PCN reaction causing severe rash involving mucus membranes or skin necrosis: No Has patient had a PCN reaction that required hospitalization: No Has patient had a PCN reaction occurring within the last 10 years: No If all of the above answers are "NO", then may proceed with Cephalosporin use.     Medications Prior to Admission  Medication Sig Dispense Refill  . aspirin EC 81 MG tablet Take 1 tablet (81 mg total) by mouth daily.    Marland Kitchen BLACK ELDERBERRY PO Take 2 tablets by mouth daily.     . Cholecalciferol (VITAMIN D3) 125 MCG (5000 UT) TABS Take 10,000 Units by mouth daily.     . diclofenac Sodium (VOLTAREN) 1 % GEL Apply 2 g topically 4 (four) times daily as needed (pain).    Marland Kitchen docusate sodium (COLACE) 100 MG capsule Take 100 mg by mouth at bedtime.     Marland Kitchen esomeprazole (NEXIUM) 20 MG capsule Take 1 capsule (20 mg total) by mouth 2 (two) times daily before a meal.    . Ferrous Sulfate (IRON) 28 MG TABS Take 28-56 mg by mouth daily.     . metoCLOPramide (REGLAN) 10 MG tablet Take 10 mg by mouth at bedtime.    . Multiple Vitamins-Minerals (MULTI + OMEGA-3 ADULT GUMMIES) CHEW Chew 1 tablet by mouth daily.       Results for orders placed or performed during the hospital encounter of 05/29/19 (from the past 48 hour(s))  CBC with Differential/Platelet     Status: Abnormal   Collection Time: 05/29/19  1:52 PM  Result Value Ref Range   WBC 6.1 4.0 - 10.5 K/uL   RBC 3.19 (L) 3.87 - 5.11 MIL/uL   Hemoglobin 8.0 (L) 12.0 - 15.0 g/dL   HCT 27.9 (L) 36.0 - 46.0 %   MCV 87.5 80.0 - 100.0 fL   MCH 25.1 (L) 26.0 - 34.0 pg   MCHC 28.7 (L) 30.0 - 36.0 g/dL   RDW 19.3 (H) 11.5 - 15.5 %   Platelets 367 150 - 400 K/uL   nRBC 0.0 0.0 - 0.2 %   Neutrophils Relative % 66 %   Neutro Abs 4.0 1.7 - 7.7 K/uL   Lymphocytes Relative 22 %   Lymphs  Abs 1.4 0.7 - 4.0 K/uL   Monocytes Relative 6 %   Monocytes Absolute 0.4 0.1 - 1.0 K/uL   Eosinophils Relative 5 %   Eosinophils Absolute 0.3 0.0 - 0.5 K/uL   Basophils Relative 1 %   Basophils Absolute 0.0 0.0 - 0.1 K/uL   Immature Granulocytes 0 %   Abs Immature Granulocytes 0.01 0.00 - 0.07 K/uL    Comment: Performed at [facility], [address] [place],  [address]   No results found.  Review of Systems  Blood pressure (!) 149/64, pulse 75, temperature 98.4 F (36.9 C), temperature source Oral, resp. rate (!) 24, height 5' 5.5" (1.664 m), weight 83.5 kg. Physical Exam  Constitutional: She appears well-developed and well-nourished.  HENT:  Mouth/Throat: Oropharynx is clear and moist.  She has upper and lower dentures in place.  Eyes: No scleral icterus.  Neck: No thyromegaly present.  Cardiovascular: Normal rate, regular rhythm and normal heart sounds.  No murmur heard. Respiratory: Effort normal and breath sounds normal.  GI: Soft. She exhibits no distension and no mass. There is no abdominal tenderness.  Musculoskeletal:        General: No edema.  Lymphadenopathy:    She has no cervical adenopathy.  Neurological: She is alert.  Skin: Skin is warm and dry.     Assessment/Plan Pharyngeal dysphagia in patient with chronic GERD. Esophagogastroduodenoscopy with esophageal dilation.  Hildred Laser, MD 05/31/2019, 11:59 AM

## 2019-06-04 ENCOUNTER — Other Ambulatory Visit (INDEPENDENT_AMBULATORY_CARE_PROVIDER_SITE_OTHER): Payer: Self-pay | Admitting: *Deleted

## 2019-06-04 DIAGNOSIS — K222 Esophageal obstruction: Secondary | ICD-10-CM

## 2019-06-11 NOTE — Patient Instructions (Signed)
Valerie Griffin  06/11/2019     @PREFPERIOPPHARMACY @   Your procedure is scheduled on 06/14/2019   Report to [facility] at  1250  P.M.  Call this number if you have problems the morning of surgery:  [phone]   Remember:  Follow the diet instructions given to you by Dr Olevia Perches office.                     Take these medicines the morning of surgery with A SIP OF WATER  nexium    Do not wear jewelry, make-up or nail polish.  Do not wear lotions, powders, or perfumes. Please wear deodorant and brush your teeth.  Do not shave 48 hours prior to surgery.  Men may shave face and neck.  Do not bring valuables to the hospital.  [facility] is not responsible for any belongings or valuables.  Contacts, dentures or bridgework may not be worn into surgery.  Leave your suitcase in the car.  After surgery it may be brought to your room.  For patients admitted to the hospital, discharge time will be determined by your treatment team.  Patients discharged the day of surgery will not be allowed to drive home.   Name and phone number of your driver:   family Special instructions:  None  Please read over the following fact sheets that you were given. Anesthesia Post-op Instructions and Care and Recovery After Surgery       Upper Endoscopy, Adult, Care After This sheet gives you information about how to care for yourself after your procedure. Your health care provider may also give you more specific instructions. If you have problems or questions, contact your health care provider. What can I expect after the procedure? After the procedure, it is common to have:  A sore throat.  Mild stomach pain or discomfort.  Bloating.  Nausea. Follow these instructions at home:   Follow instructions from your health care provider about what to eat or drink after your procedure.  Return to your normal activities as told by your health care provider. Ask your health care  provider what activities are safe for you.  Take over-the-counter and prescription medicines only as told by your health care provider.  Do not drive for 24 hours if you were given a sedative during your procedure.  Keep all follow-up visits as told by your health care provider. This is important. Contact a health care provider if you have:  A sore throat that lasts longer than one day.  Trouble swallowing. Get help right away if:  You vomit blood or your vomit looks like coffee grounds.  You have: ? A fever. ? Bloody, black, or tarry stools. ? A severe sore throat or you cannot swallow. ? Difficulty breathing. ? Severe pain in your chest or abdomen. Summary  After the procedure, it is common to have a sore throat, mild stomach discomfort, bloating, and nausea.  Do not drive for 24 hours if you were given a sedative during the procedure.  Follow instructions from your health care provider about what to eat or drink after your procedure.  Return to your normal activities as told by your health care provider. This information is not intended to replace advice given to you by your health care provider. Make sure you discuss any questions you have with your health care provider. Document Revised: 10/31/2017 Document Reviewed: 10/09/2017 Elsevier Patient Education  2020 [employer]  Esophageal Dilatation Esophageal dilatation, also called esophageal dilation, is a procedure to widen or open (dilate) a blocked or narrowed part of the esophagus. The esophagus is the part of the body that moves food and liquid from the mouth to the stomach. You may need this procedure if:  You have a buildup of scar tissue in your esophagus that makes it difficult, painful, or impossible to swallow. This can be caused by gastroesophageal reflux disease (GERD).  You have cancer of the esophagus.  There is a problem with how food moves through your esophagus. In some cases, you may need this  procedure repeated at a later time to dilate the esophagus gradually. Tell a health care provider about:  Any allergies you have.  All medicines you are taking, including vitamins, herbs, eye drops, creams, and over-the-counter medicines.  Any problems you or family members have had with anesthetic medicines.  Any blood disorders you have.  Any surgeries you have had.  Any medical conditions you have.  Any antibiotic medicines you are required to take before dental procedures.  Whether you are pregnant or may be pregnant. What are the risks? Generally, this is a safe procedure. However, problems may occur, including:  Bleeding due to a tear in the lining of the esophagus.  A hole (perforation) in the esophagus. What happens before the procedure?  Follow instructions from your health care provider about eating or drinking restrictions.  Ask your health care provider about changing or stopping your regular medicines. This is especially important if you are taking diabetes medicines or blood thinners.  Plan to have someone take you home from the hospital or clinic.  Plan to have a responsible adult care for you for at least 24 hours after you leave the hospital or clinic. This is important. What happens during the procedure?  You may be given a medicine to help you relax (sedative).  A numbing medicine may be sprayed into the back of your throat, or you may gargle the medicine.  Your health care provider may perform the dilatation using various surgical instruments, such as: ? Simple dilators. This instrument is carefully placed in the esophagus to stretch it. ? Guided wire bougies. This involves using an endoscope to insert a wire into the esophagus. A dilator is passed over this wire to enlarge the esophagus. Then the wire is removed. ? Balloon dilators. An endoscope with a small balloon at the end is inserted into the esophagus. The balloon is inflated to stretch the  esophagus and open it up. The procedure may vary among health care providers and hospitals. What happens after the procedure?  Your blood pressure, heart rate, breathing rate, and blood oxygen level will be monitored until the medicines you were given have worn off.  Your throat may feel slightly sore and numb. This will improve slowly over time.  You will not be allowed to eat or drink until your throat is no longer numb.  When you are able to drink, urinate, and sit on the edge of the bed without nausea or dizziness, you may be able to return home. Follow these instructions at home:  Take over-the-counter and prescription medicines only as told by your health care provider.  Do not drive for 24 hours if you were given a sedative during your procedure.  You should have a responsible adult with you for 24 hours after the procedure.  Follow instructions from your health care provider about any eating or drinking restrictions.  Do not use any products that contain nicotine or tobacco, such as cigarettes and e-cigarettes. If you need help quitting, ask your health care provider.  Keep all follow-up visits as told by your health care provider. This is important. Get help right away if you:  Have a fever.  Have chest pain.  Have pain that is not relieved by medication.  Have trouble breathing.  Have trouble swallowing.  Vomit blood. Summary  Esophageal dilatation, also called esophageal dilation, is a procedure to widen or open (dilate) a blocked or narrowed part of the esophagus.  Plan to have someone take you home from the hospital or clinic.  For this procedure, a numbing medicine may be sprayed into the back of your throat, or you may gargle the medicine.  Do not drive for 24 hours if you were given a sedative during your procedure. This information is not intended to replace advice given to you by your health care provider. Make sure you discuss any questions you have  with your health care provider. Document Revised: 03/06/2019 Document Reviewed: 03/14/2017 Elsevier Patient Education  2020 [place] After These instructions provide you with information about caring for yourself after your procedure. Your health care provider may also give you more specific instructions. Your treatment has been planned according to current medical practices, but problems sometimes occur. Call your health care provider if you have any problems or questions after your procedure. What can I expect after the procedure? After your procedure, you may:  Feel sleepy for several hours.  Feel clumsy and have poor balance for several hours.  Feel forgetful about what happened after the procedure.  Have poor judgment for several hours.  Feel nauseous or vomit.  Have a sore throat if you had a breathing tube during the procedure. Follow these instructions at home: For at least 24 hours after the procedure:      Have a responsible adult stay with you. It is important to have someone help care for you until you are awake and alert.  Rest as needed.  Do not: ? Participate in activities in which you could fall or become injured. ? Drive. ? Use heavy machinery. ? Drink alcohol. ? Take sleeping pills or medicines that cause drowsiness. ? Make important decisions or sign legal documents. ? Take care of children on your own. Eating and drinking  Follow the diet that is recommended by your health care provider.  If you vomit, drink water, juice, or soup when you can drink without vomiting.  Make sure you have little or no nausea before eating solid foods. General instructions  Take over-the-counter and prescription medicines only as told by your health care provider.  If you have sleep apnea, surgery and certain medicines can increase your risk for breathing problems. Follow instructions from your health care provider about wearing  your sleep device: ? Anytime you are sleeping, including during daytime naps. ? While taking prescription pain medicines, sleeping medicines, or medicines that make you drowsy.  If you smoke, do not smoke without supervision.  Keep all follow-up visits as told by your health care provider. This is important. Contact a health care provider if:  You keep feeling nauseous or you keep vomiting.  You feel light-headed.  You develop a rash.  You have a fever. Get help right away if:  You have trouble breathing. Summary  For several hours after your procedure, you may feel sleepy and have poor judgment.  Have  a responsible adult stay with you for at least 24 hours or until you are awake and alert. This information is not intended to replace advice given to you by your health care provider. Make sure you discuss any questions you have with your health care provider. Document Revised: 08/07/2017 Document Reviewed: 08/30/2015 Elsevier Patient Education  [place].

## 2019-06-12 ENCOUNTER — Other Ambulatory Visit: Payer: Self-pay

## 2019-06-12 ENCOUNTER — Encounter (HOSPITAL_COMMUNITY): Payer: Self-pay

## 2019-06-12 ENCOUNTER — Encounter (HOSPITAL_COMMUNITY)
Admission: RE | Admit: 2019-06-12 | Discharge: 2019-06-12 | Disposition: A | Discharge status: Home / Self Care | Payer: Medicare HMO | Source: Ambulatory Visit | Attending: Internal Medicine | Admitting: Internal Medicine

## 2019-06-12 ENCOUNTER — Other Ambulatory Visit (HOSPITAL_COMMUNITY)
Admission: RE | Admit: 2019-06-12 | Discharge: 2019-06-12 | Disposition: A | Discharge status: Home / Self Care | Payer: Medicare HMO | Source: Ambulatory Visit | Attending: Internal Medicine | Admitting: Internal Medicine

## 2019-06-12 DIAGNOSIS — Z20822 Contact with and (suspected) exposure to covid-19: Secondary | ICD-10-CM | POA: Diagnosis not present

## 2019-06-12 DIAGNOSIS — Z01812 Encounter for preprocedural laboratory examination: Secondary | ICD-10-CM | POA: Diagnosis present

## 2019-06-12 LAB — SARS CORONAVIRUS 2 (TAT 6-24 HRS): SARS Coronavirus 2: NEGATIVE

## 2019-06-14 ENCOUNTER — Ambulatory Visit (HOSPITAL_COMMUNITY)
Admission: RE | Admit: 2019-06-14 | Discharge: 2019-06-14 | Disposition: A | Discharge status: Home / Self Care | Payer: Medicare HMO | Attending: Internal Medicine | Admitting: Internal Medicine

## 2019-06-14 ENCOUNTER — Ambulatory Visit (HOSPITAL_COMMUNITY): Payer: Medicare HMO | Admitting: Anesthesiology

## 2019-06-14 ENCOUNTER — Encounter (HOSPITAL_COMMUNITY)
Admission: RE | Disposition: A | Discharge status: Home / Self Care | Payer: Self-pay | Source: Home / Self Care | Attending: Internal Medicine

## 2019-06-14 DIAGNOSIS — K449 Diaphragmatic hernia without obstruction or gangrene: Secondary | ICD-10-CM | POA: Insufficient documentation

## 2019-06-14 DIAGNOSIS — Z79899 Other long term (current) drug therapy: Secondary | ICD-10-CM | POA: Insufficient documentation

## 2019-06-14 DIAGNOSIS — R1314 Dysphagia, pharyngoesophageal phase: Secondary | ICD-10-CM | POA: Insufficient documentation

## 2019-06-14 DIAGNOSIS — K2100 Gastro-esophageal reflux disease with esophagitis, without bleeding: Secondary | ICD-10-CM | POA: Insufficient documentation

## 2019-06-14 DIAGNOSIS — K222 Esophageal obstruction: Secondary | ICD-10-CM | POA: Insufficient documentation

## 2019-06-14 DIAGNOSIS — Z7982 Long term (current) use of aspirin: Secondary | ICD-10-CM | POA: Diagnosis not present

## 2019-06-14 DIAGNOSIS — M1990 Unspecified osteoarthritis, unspecified site: Secondary | ICD-10-CM | POA: Insufficient documentation

## 2019-06-14 HISTORY — PX: ESOPHAGEAL DILATION: SHX303

## 2019-06-14 HISTORY — PX: ESOPHAGOGASTRODUODENOSCOPY (EGD) WITH PROPOFOL: SHX5813

## 2019-06-14 SURGERY — ESOPHAGOGASTRODUODENOSCOPY (EGD) WITH PROPOFOL
Anesthesia: General

## 2019-06-14 MED ORDER — LACTATED RINGERS IV SOLN: Freq: Once | INTRAVENOUS | Status: AC

## 2019-06-14 MED ORDER — CHLORHEXIDINE GLUCONATE CLOTH 2 % EX PADS: 6.0000 | MEDICATED_PAD | Freq: Once | CUTANEOUS | Status: DC

## 2019-06-14 MED ORDER — LIDOCAINE HCL (CARDIAC) PF 100 MG/5ML IV SOSY
PREFILLED_SYRINGE | INTRAVENOUS | Status: DC | PRN
  Administered 2019-06-14: 60 mg via INTRAVENOUS

## 2019-06-14 MED ORDER — PROPOFOL 500 MG/50ML IV EMUL
INTRAVENOUS | Status: DC | PRN
  Administered 2019-06-14: 150 ug/kg/min via INTRAVENOUS

## 2019-06-14 MED ORDER — LIDOCAINE 2% (20 MG/ML) 5 ML SYRINGE
INTRAMUSCULAR | Status: AC
  Filled 2019-06-14: qty 5

## 2019-06-14 NOTE — Anesthesia Postprocedure Evaluation (Signed)
Anesthesia Post Note  Patient: MAVERICK PATMAN  Procedure(s) Performed: ESOPHAGOGASTRODUODENOSCOPY (EGD) WITH PROPOFOL (N/A ) ESOPHAGEAL DILATION (N/A )  Patient location during evaluation: PACU Anesthesia Type: MAC Level of consciousness: awake and alert, awake, patient cooperative and oriented Pain management: pain level controlled Vital Signs Assessment: post-procedure vital signs reviewed and stable Respiratory status: spontaneous breathing, nonlabored ventilation and respiratory function stable Cardiovascular status: stable Postop Assessment: no apparent nausea or vomiting Anesthetic complications: no     Last Vitals:  Vitals:   06/14/19 1332  BP: (!) 149/55  Pulse: 77  Resp: 20  Temp: 37 C  SpO2: 100%    Last Pain:  Vitals:   06/14/19 1556  TempSrc:   PainSc: 0-No pain                 Olander Friedl

## 2019-06-14 NOTE — Anesthesia Preprocedure Evaluation (Signed)
Anesthesia Evaluation  Patient identified by MRN, date of birth, ID band Patient awake    Reviewed: Allergy & Precautions, NPO status , Patient's Chart, lab work & pertinent test results  History of Anesthesia Complications Negative for: history of anesthetic complications  Airway Mallampati: III  TM Distance: >3 FB Neck ROM: Full    Dental  (+) Lower Dentures, Upper Dentures   Pulmonary neg pulmonary ROS,    Pulmonary exam normal breath sounds clear to auscultation       Cardiovascular Exercise Tolerance: Good  Rhythm:Regular Rate:Normal     Neuro/Psych negative neurological ROS  negative psych ROS   GI/Hepatic GERD  Medicated,  Endo/Other    Renal/GU Renal InsufficiencyRenal disease     Musculoskeletal  (+) Arthritis , Osteoarthritis,    Abdominal   Peds  Hematology  (+) anemia ,   Anesthesia Other Findings   Reproductive/Obstetrics                           Anesthesia Physical Anesthesia Plan  ASA: III  Anesthesia Plan: General   Post-op Pain Management:    Induction: Intravenous  PONV Risk Score and Plan:   Airway Management Planned: Nasal Cannula and Natural Airway  Additional Equipment:   Intra-op Plan:   Post-operative Plan:   Informed Consent: I have reviewed the patients History and Physical, chart, labs and discussed the procedure including the risks, benefits and alternatives for the proposed anesthesia with the patient or authorized representative who has indicated his/her understanding and acceptance.     Dental advisory given  Plan Discussed with: CRNA and Surgeon  Anesthesia Plan Comments:        Anesthesia Quick Evaluation

## 2019-06-14 NOTE — H&P (Signed)
Valerie Griffin is an 86 y.o. female.   Chief Complaint: Patient is here for esophagogastroduodenoscopy with esophageal dilation. HPI: Patient is 86 year old Caucasian female who has chronic GERD known large hiatal hernia and history of distal esophageal stricture for many years requiring multiple dilations.  Her esophagus was dilated 2 weeks ago.  She had a high-grade stricture.  She also had foreign body which was removed.  Stricture was dilated 13.5 mm.  She said she did well until this past weekend and she has been having difficulty swallowing.  She is maintaining her weight.  She is returning for repeat dilation.  Goal is to dilate the stricture to at least 15 mm today if not higher.  Past Medical History:  Diagnosis Date  . Anemia   . Arthritis   . Constipation   . Dysphagia 08/16/2016  . GERD (gastroesophageal reflux disease) complicated by recalcitrant esophageal stricture     Past Surgical History:  Procedure Laterality Date  . ABDOMINAL HYSTERECTOMY    . APPENDECTOMY    . broken arm and wrist with plate and scews rt wrist Right   . CHOLECYSTECTOMY    . COLONOSCOPY    . complete hysterectomy'     fibroid tumors bleeding  . ESOPHAGEAL DILATION N/A 08/22/2016   Procedure: ESOPHAGEAL DILATION;  Surgeon: Rogene Houston, MD;  [facility];  Service: Endoscopy;  Laterality: N/A;  . ESOPHAGEAL DILATION N/A 09/14/2016   Procedure: ESOPHAGEAL DILATION;  Surgeon: Rogene Houston, MD;  [facility];  Service: Endoscopy;  Laterality: N/A;  . ESOPHAGEAL DILATION N/A 07/26/2017   Procedure: ESOPHAGEAL DILATION;  Surgeon: Rogene Houston, MD;  [facility];  Service: Endoscopy;  Laterality: N/A;  . ESOPHAGEAL DILATION N/A 08/31/2017   Procedure: ESOPHAGEAL DILATION;  Surgeon: Rogene Houston, MD;  [facility];  Service: Endoscopy;  Laterality: N/A;  . ESOPHAGEAL DILATION N/A 10/26/2017   Procedure: ESOPHAGEAL DILATION;  Surgeon: Rogene Houston, MD;   [facility];  Service: Endoscopy;  Laterality: N/A;  . ESOPHAGEAL DILATION N/A 02/07/2018   Procedure: ESOPHAGEAL DILATION;  Surgeon: Rogene Houston, MD;  [facility];  Service: Endoscopy;  Laterality: N/A;  . ESOPHAGEAL DILATION N/A 05/31/2019   Procedure: ESOPHAGEAL DILATION;  Surgeon: Rogene Houston, MD;  [facility];  Service: Endoscopy;  Laterality: N/A;  . ESOPHAGOGASTRODUODENOSCOPY N/A 08/22/2016   Procedure: ESOPHAGOGASTRODUODENOSCOPY (EGD);  Surgeon: Rogene Houston, MD;  [facility];  Service: Endoscopy;  Laterality: N/A;  . ESOPHAGOGASTRODUODENOSCOPY N/A 09/14/2016   Procedure: ESOPHAGOGASTRODUODENOSCOPY (EGD);  Surgeon: Rogene Houston, MD;  [facility];  Service: Endoscopy;  Laterality: N/A;  1225  . ESOPHAGOGASTRODUODENOSCOPY N/A 07/26/2017   Procedure: ESOPHAGOGASTRODUODENOSCOPY (EGD);  Surgeon: Rogene Houston, MD;  [facility];  Service: Endoscopy;  Laterality: N/A;  1:45  . ESOPHAGOGASTRODUODENOSCOPY N/A 08/31/2017   Procedure: ESOPHAGOGASTRODUODENOSCOPY (EGD);  Surgeon: Rogene Houston, MD;  [facility];  Service: Endoscopy;  Laterality: N/A;  . ESOPHAGOGASTRODUODENOSCOPY N/A 10/26/2017   Procedure: ESOPHAGOGASTRODUODENOSCOPY (EGD);  Surgeon: Rogene Houston, MD;  [facility];  Service: Endoscopy;  Laterality: N/A;  1115  . ESOPHAGOGASTRODUODENOSCOPY N/A 02/07/2018   Procedure: ESOPHAGOGASTRODUODENOSCOPY (EGD);  Surgeon: Rogene Houston, MD;  [facility];  Service: Endoscopy;  Laterality: N/A;  830  . ESOPHAGOGASTRODUODENOSCOPY (EGD) WITH PROPOFOL N/A 05/31/2019   Procedure: ESOPHAGOGASTRODUODENOSCOPY (EGD) WITH PROPOFOL;  Surgeon: Rogene Houston, MD;  [facility];  Service: Endoscopy;  Laterality: N/A;  2:35  . FOREIGN BODY REMOVAL  05/31/2019   Procedure: FOREIGN BODY REMOVAL;  Surgeon: Rogene Houston, MD;  [facility];  Service: Endoscopy;;     Family History  Problem Relation Age of Onset  . Heart Problems Mother   . Alzheimer's disease Mother   . Hypertension Mother   . Prostate cancer Father   . Heart attack Father   . Heart Problems Sister    Social History:  reports that she has never smoked. She has never used smokeless tobacco. She reports that she does not drink alcohol or use drugs.  Allergies:  Allergies  Allergen Reactions  . Penicillins Rash and Other (See Comments)    40 years ago, caused rash and tachycardia Has patient had a PCN reaction causing immediate rash, facial/tongue/throat swelling, SOB or lightheadedness with hypotension: No Has patient had a PCN reaction causing severe rash involving mucus membranes or skin necrosis: No Has patient had a PCN reaction that required hospitalization: No Has patient had a PCN reaction occurring within the last 10 years: No If all of the above answers are "NO", then may proceed with Cephalosporin use.     Medications Prior to Admission  Medication Sig Dispense Refill  . BLACK ELDERBERRY PO Take 2 tablets by mouth daily.     . Cholecalciferol (VITAMIN D3) 125 MCG (5000 UT) TABS Take 10,000 Units by mouth daily.     . diclofenac Sodium (VOLTAREN) 1 % GEL Apply 2 g topically 4 (four) times daily as needed (pain).    Marland Kitchen docusate sodium (COLACE) 100 MG capsule Take 100 mg by mouth at bedtime.     Marland Kitchen esomeprazole (NEXIUM) 20 MG capsule Take 1 capsule (20 mg total) by mouth 2 (two) times daily before a meal.    . Ferrous Sulfate (IRON) 28 MG TABS Take 28-56 mg by mouth daily.     . metoCLOPramide (REGLAN) 10 MG tablet Take 10 mg by mouth at bedtime.    . Multiple Vitamins-Minerals (MULTI + OMEGA-3 ADULT GUMMIES) CHEW Chew 1 tablet by mouth daily.     Marland Kitchen aspirin EC 81 MG tablet Take 1 tablet (81 mg total) by mouth daily.      No results found for this or any previous visit (from the past 48 hour(s)). No results found.  Review of Systems  Blood pressure (!) 149/55,  pulse 77, temperature 98.6 F (37 C), temperature source Oral, resp. rate 20, height 5' 5.5" (1.664 m), weight 83.5 kg, SpO2 100 %. Physical Exam  Constitutional: She appears well-developed and well-nourished.  HENT:  Mouth/Throat: Oropharynx is clear and moist.  Eyes: Conjunctivae are normal. No scleral icterus.  Neck: No thyromegaly present.  Cardiovascular: Normal rate, regular rhythm and normal heart sounds.  No murmur heard. Respiratory: Effort normal and breath sounds normal.  GI: Soft. She exhibits no distension and no mass. There is no abdominal tenderness.  Lymphadenopathy:    She has no cervical adenopathy.  Neurological: She is alert.  Skin: Skin is warm.     Assessment/Plan Esophageal dysphagia secondary to high-grade distal esophageal stricture. Esophagogastroduodenoscopy with esophageal dilation.  Hildred Laser, MD 06/14/2019, 3:52 PM

## 2019-06-14 NOTE — Transfer of Care (Signed)
Immediate Anesthesia Transfer of Care Note  Patient: Valerie Griffin  Procedure(s) Performed: ESOPHAGOGASTRODUODENOSCOPY (EGD) WITH PROPOFOL (N/A ) ESOPHAGEAL DILATION (N/A )  Patient Location: PACU  Anesthesia Type:MAC  Level of Consciousness: awake, alert , oriented and patient cooperative  Airway & Oxygen Therapy: Patient Spontanous Breathing  Post-op Assessment: Report given to RN, Post -op Vital signs reviewed and stable and Patient moving all extremities X 4  Post vital signs: Reviewed and stable  Last Vitals:  Vitals Value Taken Time  BP    Temp    Pulse 89 06/14/19 1621  Resp    SpO2 99 % 06/14/19 1621  Vitals shown include unvalidated device data.  Last Pain:  Vitals:   06/14/19 1556  TempSrc:   PainSc: 0-No pain      Patients Stated Pain Goal: 8 (12/87/86 7672)  Complications: No apparent anesthesia complications

## 2019-06-14 NOTE — Op Note (Addendum)
[facility] Patient Name: Valerie Griffin Procedure Date: 06/14/2019 3:57 PM MRN: [id number] Date of Birth: Oct 22, 1933 Attending MD: Hildred Laser , MD CSN: [id number] Age: 86 Admit Type: Outpatient Procedure:                Upper GI endoscopy Indications:              Esophageal dysphagia, For therapy of esophageal                            stenosis Providers:                Hildred Laser, MD, Otis Peak B. Sharon Seller, RN, Bonnetta Barry, Technician Referring MD:             Glenda Chroman, MD Medicines:                Propofol per Anesthesia Complications:            No immediate complications. Estimated Blood Loss:     Estimated blood loss was minimal. Procedure:                Pre-Anesthesia Assessment:                           - Prior to the procedure, a History and Physical                            was performed, and patient medications and                            allergies were reviewed. The patient's tolerance of                            previous anesthesia was also reviewed. The risks                            and benefits of the procedure and the sedation                            options and risks were discussed with the patient.                            All questions were answered, and informed consent                            was obtained. Prior Anticoagulants: The patient has                            taken no previous anticoagulant or antiplatelet                            agents. ASA Grade Assessment: III - A patient with  severe systemic disease. After reviewing the risks                            and benefits, the patient was deemed in                            satisfactory condition to undergo the procedure.                           After obtaining informed consent, the endoscope was                            passed under direct vision. Throughout the                            procedure, the  patient's blood pressure, pulse, and                            oxygen saturations were monitored continuously. The                            [id number]-H190 [id number] scope was introduced through the                            mouth, and advanced to the second part of duodenum.                            The upper GI endoscopy was accomplished without                            difficulty. The patient tolerated the procedure                            well. Scope In: 4:04:08 PM Scope Out: 4:13:02 PM Total Procedure Duration: 0 hours 8 minutes 54 seconds  Findings:      The proximal esophagus was normal.      LA Grade C (one or more mucosal breaks continuous between tops of 2 or       more mucosal folds, less than 75% circumference) esophagitis with no       bleeding was found 25 to 30 cm from the incisors.      One benign-appearing, intrinsic severe stenosis was found 30 cm from the       incisors. This stenosis measured 8 mm (inner diameter) x less than one       cm (in length). The stenosis was traversed after dilation. The dilation       site was examined and showed moderate mucosal disruption, mild       improvement in luminal narrowing and no perforation.      A 9 cm hiatal hernia was present.      The entire examined stomach was normal.      The duodenal bulb and second portion of the duodenum were normal. Impression:               - Normal proximal esophagus.                           -  LA Grade C reflux esophagitis with no bleeding.                           - Benign-appearing esophageal stenosis with                            scarring and pseudo-diverticula.                           - 9 cm hiatal hernia.                           - Normal stomach.                           - Normal duodenal bulb and second portion of the                            duodenum.                           - No specimens collected. Moderate Sedation:      Per Anesthesia Care Recommendation:            - Patient has a contact number available for                            emergencies. The signs and symptoms of potential                            delayed complications were discussed with the                            patient. Return to normal activities tomorrow.                            Written discharge instructions were provided to the                            patient.                           - Mechanical soft diet today.                           - Continue present medications.                           - Repeat upper endoscopy in 3 weeks. Procedure Code(s):        --- Professional ---                           [phone], Esophagogastroduodenoscopy, flexible,                            transoral; diagnostic, including collection of  specimen(s) by brushing or washing, when performed                            (separate procedure) Diagnosis Code(s):        --- Professional ---                           K21.00, Gastro-esophageal reflux disease with                            esophagitis, without bleeding                           K22.2, Esophageal obstruction                           K44.9, Diaphragmatic hernia without obstruction or                            gangrene                           R13.14, Dysphagia, pharyngoesophageal phase CPT copyright 2019 American Medical Association. All rights reserved. The codes documented in this report are preliminary and upon coder review may  be revised to meet current compliance requirements. Hildred Laser, MD Hildred Laser, MD 06/14/2019 4:21:17 PM This report has been signed electronically. Number of Addenda: 1 Addendum Number: 1   Addendum Date: 06/20/2019 9:00:57 AM      Esophageal stricture dilated from 12 mm to 155 mm with balloon dilator. Hildred Laser, MD Hildred Laser, MD 06/20/2019 9:02:41 AM This report has been signed electronically.

## 2019-06-14 NOTE — Discharge Instructions (Signed)
Resume aspirin on 06/17/2019. Resume other medications as before. Mechanical soft diet. No driving for 24 hours. Repeat dilation in 3 to 4 weeks.   Upper Endoscopy, Adult, Care After This sheet gives you information about how to care for yourself after your procedure. Your health care provider may also give you more specific instructions. If you have problems or questions, contact your health care provider. What can I expect after the procedure? After the procedure, it is common to have:  A sore throat.  Mild stomach pain or discomfort.  Bloating.  Nausea. Follow these instructions at home:   Follow instructions from your health care provider about what to eat or drink after your procedure.  Return to your normal activities as told by your health care provider. Ask your health care provider what activities are safe for you.  Take over-the-counter and prescription medicines only as told by your health care provider.  Do not drive for 24 hours if you were given a sedative during your procedure.  Keep all follow-up visits as told by your health care provider. This is important. Contact a health care provider if you have:  A sore throat that lasts longer than one day.  Trouble swallowing. Get help right away if:  You vomit blood or your vomit looks like coffee grounds.  You have: ? A fever. ? Bloody, black, or tarry stools. ? A severe sore throat or you cannot swallow. ? Difficulty breathing. ? Severe pain in your chest or abdomen. Summary  After the procedure, it is common to have a sore throat, mild stomach discomfort, bloating, and nausea.  Do not drive for 24 hours if you were given a sedative during the procedure.  Follow instructions from your health care provider about what to eat or drink after your procedure.  Return to your normal activities as told by your health care provider. This information is not intended to replace advice given to you by your health  care provider. Make sure you discuss any questions you have with your health care provider. Document Revised: 10/31/2017 Document Reviewed: 10/09/2017 Elsevier Patient Education  2020 [place] After These instructions provide you with information about caring for yourself after your procedure. Your health care provider may also give you more specific instructions. Your treatment has been planned according to current medical practices, but problems sometimes occur. Call your health care provider if you have any problems or questions after your procedure. What can I expect after the procedure? After your procedure, you may:  Feel sleepy for several hours.  Feel clumsy and have poor balance for several hours.  Feel forgetful about what happened after the procedure.  Have poor judgment for several hours.  Feel nauseous or vomit.  Have a sore throat if you had a breathing tube during the procedure. Follow these instructions at home: For at least 24 hours after the procedure:      Have a responsible adult stay with you. It is important to have someone help care for you until you are awake and alert.  Rest as needed.  Do not: ? Participate in activities in which you could fall or become injured. ? Drive. ? Use heavy machinery. ? Drink alcohol. ? Take sleeping pills or medicines that cause drowsiness. ? Make important decisions or sign legal documents. ? Take care of children on your own. Eating and drinking  Follow the diet that is recommended by your health care provider.  If you vomit,  drink water, juice, or soup when you can drink without vomiting.  Make sure you have little or no nausea before eating solid foods. General instructions  Take over-the-counter and prescription medicines only as told by your health care provider.  If you have sleep apnea, surgery and certain medicines can increase your risk for breathing problems. Follow  instructions from your health care provider about wearing your sleep device: ? Anytime you are sleeping, including during daytime naps. ? While taking prescription pain medicines, sleeping medicines, or medicines that make you drowsy.  If you smoke, do not smoke without supervision.  Keep all follow-up visits as told by your health care provider. This is important. Contact a health care provider if:  You keep feeling nauseous or you keep vomiting.  You feel light-headed.  You develop a rash.  You have a fever. Get help right away if:  You have trouble breathing. Summary  For several hours after your procedure, you may feel sleepy and have poor judgment.  Have a responsible adult stay with you for at least 24 hours or until you are awake and alert. This information is not intended to replace advice given to you by your health care provider. Make sure you discuss any questions you have with your health care provider. Document Revised: 08/07/2017 Document Reviewed: 08/30/2015 Elsevier Patient Education  [place].

## 2019-06-19 ENCOUNTER — Other Ambulatory Visit (INDEPENDENT_AMBULATORY_CARE_PROVIDER_SITE_OTHER): Payer: Self-pay | Admitting: *Deleted

## 2019-06-19 DIAGNOSIS — K222 Esophageal obstruction: Secondary | ICD-10-CM

## 2019-07-02 ENCOUNTER — Other Ambulatory Visit (HOSPITAL_COMMUNITY)
Admission: RE | Admit: 2019-07-02 | Discharge: 2019-07-02 | Disposition: A | Discharge status: Home / Self Care | Payer: Medicare HMO | Source: Ambulatory Visit | Attending: Internal Medicine | Admitting: Internal Medicine

## 2019-07-02 ENCOUNTER — Other Ambulatory Visit: Payer: Self-pay

## 2019-07-02 DIAGNOSIS — Z01812 Encounter for preprocedural laboratory examination: Secondary | ICD-10-CM | POA: Insufficient documentation

## 2019-07-02 DIAGNOSIS — Z20822 Contact with and (suspected) exposure to covid-19: Secondary | ICD-10-CM | POA: Diagnosis not present

## 2019-07-02 LAB — SARS CORONAVIRUS 2 (TAT 6-24 HRS): SARS Coronavirus 2: NEGATIVE

## 2019-07-04 ENCOUNTER — Encounter (HOSPITAL_COMMUNITY)
Admission: RE | Disposition: A | Discharge status: Home / Self Care | Payer: Self-pay | Source: Home / Self Care | Attending: Internal Medicine

## 2019-07-04 ENCOUNTER — Encounter (HOSPITAL_COMMUNITY): Payer: Self-pay | Admitting: Internal Medicine

## 2019-07-04 ENCOUNTER — Other Ambulatory Visit: Payer: Self-pay

## 2019-07-04 ENCOUNTER — Ambulatory Visit (HOSPITAL_COMMUNITY)
Admission: RE | Admit: 2019-07-04 | Discharge: 2019-07-04 | Disposition: A | Discharge status: Home / Self Care | Payer: Medicare HMO | Attending: Internal Medicine | Admitting: Internal Medicine

## 2019-07-04 ENCOUNTER — Other Ambulatory Visit (INDEPENDENT_AMBULATORY_CARE_PROVIDER_SITE_OTHER): Payer: Self-pay | Admitting: *Deleted

## 2019-07-04 DIAGNOSIS — Z79899 Other long term (current) drug therapy: Secondary | ICD-10-CM | POA: Diagnosis not present

## 2019-07-04 DIAGNOSIS — Z880 Allergy status to penicillin: Secondary | ICD-10-CM | POA: Insufficient documentation

## 2019-07-04 DIAGNOSIS — K222 Esophageal obstruction: Secondary | ICD-10-CM

## 2019-07-04 DIAGNOSIS — Z7982 Long term (current) use of aspirin: Secondary | ICD-10-CM | POA: Diagnosis not present

## 2019-07-04 DIAGNOSIS — D649 Anemia, unspecified: Secondary | ICD-10-CM | POA: Insufficient documentation

## 2019-07-04 DIAGNOSIS — M1990 Unspecified osteoarthritis, unspecified site: Secondary | ICD-10-CM | POA: Insufficient documentation

## 2019-07-04 DIAGNOSIS — K2100 Gastro-esophageal reflux disease with esophagitis, without bleeding: Secondary | ICD-10-CM | POA: Insufficient documentation

## 2019-07-04 DIAGNOSIS — K449 Diaphragmatic hernia without obstruction or gangrene: Secondary | ICD-10-CM

## 2019-07-04 DIAGNOSIS — R1314 Dysphagia, pharyngoesophageal phase: Secondary | ICD-10-CM

## 2019-07-04 HISTORY — PX: ESOPHAGOGASTRODUODENOSCOPY: SHX5428

## 2019-07-04 HISTORY — PX: ESOPHAGEAL DILATION: SHX303

## 2019-07-04 SURGERY — EGD (ESOPHAGOGASTRODUODENOSCOPY)
Anesthesia: Moderate Sedation

## 2019-07-04 MED ORDER — STERILE WATER FOR IRRIGATION IR SOLN
Status: DC | PRN
  Administered 2019-07-04: 1.5 mL

## 2019-07-04 MED ORDER — MEPERIDINE HCL 50 MG/ML IJ SOLN
INTRAMUSCULAR | Status: AC
  Filled 2019-07-04: qty 1

## 2019-07-04 MED ORDER — MIDAZOLAM HCL 5 MG/5ML IJ SOLN
INTRAMUSCULAR | Status: DC | PRN
  Administered 2019-07-04: 2 mg via INTRAVENOUS
  Administered 2019-07-04 (×2): 1 mg via INTRAVENOUS

## 2019-07-04 MED ORDER — LIDOCAINE VISCOUS HCL 2 % MT SOLN
OROMUCOSAL | Status: AC
  Filled 2019-07-04: qty 15

## 2019-07-04 MED ORDER — SODIUM CHLORIDE 0.9 % IV SOLN: INTRAVENOUS | Status: DC

## 2019-07-04 MED ORDER — LIDOCAINE VISCOUS HCL 2 % MT SOLN
OROMUCOSAL | Status: DC | PRN
  Administered 2019-07-04: 4 mL via OROMUCOSAL

## 2019-07-04 MED ORDER — MIDAZOLAM HCL 5 MG/5ML IJ SOLN
INTRAMUSCULAR | Status: AC
  Filled 2019-07-04: qty 10

## 2019-07-04 MED ORDER — MEPERIDINE HCL 50 MG/ML IJ SOLN
INTRAMUSCULAR | Status: DC | PRN
  Administered 2019-07-04 (×2): 25 mg via INTRAVENOUS

## 2019-07-04 NOTE — Op Note (Signed)
[facility] Patient Name: Valerie Griffin Procedure Date: 07/04/2019 7:23 AM MRN: [id number] Date of Birth: 1933-07-05 Attending MD: Hildred Laser , MD CSN: [id number] Age: 86 Admit Type: Outpatient Procedure:                Upper GI endoscopy Indications:              Esophageal dysphagia, For therapy of esophageal                            stricture Providers:                Hildred Laser, MD, Otis Peak B. Sharon Seller, RN, Nelma Rothman, Technician Referring MD:             Glenda Chroman, MD Medicines:                Lidocaine spray, Meperidine 50 mg IV, Midazolam 4                            mg IV Complications:            No immediate complications. Estimated Blood Loss:     Estimated blood loss was minimal. Procedure:                Pre-Anesthesia Assessment:                           - Prior to the procedure, a History and Physical                            was performed, and patient medications and                            allergies were reviewed. The patient's tolerance of                            previous anesthesia was also reviewed. The risks                            and benefits of the procedure and the sedation                            options and risks were discussed with the patient.                            All questions were answered, and informed consent                            was obtained. Prior Anticoagulants: The patient has                            taken no previous anticoagulant or antiplatelet  agents except for aspirin. ASA Grade Assessment: II                            - A patient with mild systemic disease. After                            reviewing the risks and benefits, the patient was                            deemed in satisfactory condition to undergo the                            procedure.                           After obtaining informed consent, the endoscope was              passed under direct vision. Throughout the                            procedure, the patient's blood pressure, pulse, and                            oxygen saturations were monitored continuously. The                            GIF-H190 [id number]) was introduced through the                            mouth, and advanced to the second part of duodenum.                            The upper GI endoscopy was accomplished without                            difficulty. The patient tolerated the procedure                            well. Scope In: 7:40:45 AM Scope Out: 7:52:06 AM Total Procedure Duration: 0 hours 11 minutes 21 seconds  Findings:      LA Grade B (one or more mucosal breaks greater than 5 mm, not extending       between the tops of two mucosal folds) esophagitis with no bleeding was       found 25 to 30 cm from the incisors.      One benign-appearing, intrinsic severe stenosis was found 30 cm from the       incisors. The stenosis was traversed after dilation. A TTS dilator was       passed through the scope. Dilation with a 12-13.5-15 mm balloon dilator       was performed to 12 mm, 13.5 mm and 15 mm. The dilation site was       examined and showed mild mucosal disruption, mild improvement in luminal       narrowing and no perforation.      An 8 cm hiatal hernia was  present.      The entire examined stomach was normal.      The duodenal bulb and second portion of the duodenum were normal. Impression:               - LA Grade B reflux esophagitis with no bleeding.                            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                           - Benign-appearing esophageal stenosis. Dilated.                           - 8 cm hiatal hernia.                           - Normal stomach.                           - Normal duodenal bulb and second portion of the                            duodenum.                            - No specimens collected. Moderate Sedation:      Moderate (conscious) sedation was administered by the endoscopy nurse       and supervised by the endoscopist. The following parameters were       monitored: oxygen saturation, heart rate, blood pressure, CO2       capnography and response to care. Total physician intraservice time was       17 minutes. Recommendation:           - Patient has a contact number available for                            emergencies. The signs and symptoms of potential                            delayed complications were discussed with the                            patient. Return to normal activities tomorrow.                            Written discharge instructions were provided to the                            patient.                           - Mechanical soft diet today.                           -  Continue present medications.                           - No aspirin, ibuprofen, naproxen, or other                            non-steroidal anti-inflammatory drugs for 3 days.                           - Repeat upper endoscopy in 3 weeks. Procedure Code(s):        --- Professional ---                           [id number], Esophagogastroduodenoscopy, flexible,                            transoral; with transendoscopic balloon dilation of                            esophagus (less than 30 mm diameter)                           G0500, Moderate sedation services provided by the                            same physician or other qualified health care                            professional performing a gastrointestinal                            endoscopic service that sedation supports,                            requiring the presence of an independent trained                            observer to assist in the monitoring of the                            patient's level of consciousness and physiological                            status; initial  15 minutes of intra-service time;                            patient age 58 years or older (additional time may                            be reported with [phone], as appropriate) Diagnosis Code(s):        --- Professional ---                           K21.00, Gastro-esophageal reflux disease with  esophagitis, without bleeding                           K22.2, Esophageal obstruction                           K44.9, Diaphragmatic hernia without obstruction or                            gangrene                           R13.14, Dysphagia, pharyngoesophageal phase CPT copyright 2019 American Medical Association. All rights reserved. The codes documented in this report are preliminary and upon coder review may  be revised to meet current compliance requirements. Hildred Laser, MD Hildred Laser, MD 07/04/2019 8:03:51 AM This report has been signed electronically. Number of Addenda: 0

## 2019-07-04 NOTE — Discharge Instructions (Signed)
Resume aspirin on 07/07/2019 Resume other medications and diet as before. No driving for 24 hours. Repeat dilation in 3 to 4 weeks.  Would consider short-term stenting.   Upper Endoscopy, Adult, Care After This sheet gives you information about how to care for yourself after your procedure. Your health care provider may also give you more specific instructions. If you have problems or questions, contact your health care provider. What can I expect after the procedure? After the procedure, it is common to have:  A sore throat.  Mild stomach pain or discomfort.  Bloating.  Nausea. Follow these instructions at home:   Follow instructions from your health care provider about what to eat or drink after your procedure.  Return to your normal activities as told by your health care provider. Ask your health care provider what activities are safe for you.  Take over-the-counter and prescription medicines only as told by your health care provider.  Do not drive for 24 hours if you were given a sedative during your procedure.  Keep all follow-up visits as told by your health care provider. This is important. Contact a health care provider if you have:  A sore throat that lasts longer than one day.  Trouble swallowing. Get help right away if:  You vomit blood or your vomit looks like coffee grounds.  You have: ? A fever. ? Bloody, black, or tarry stools. ? A severe sore throat or you cannot swallow. ? Difficulty breathing. ? Severe pain in your chest or abdomen. Summary  After the procedure, it is common to have a sore throat, mild stomach discomfort, bloating, and nausea.  Do not drive for 24 hours if you were given a sedative during the procedure.  Follow instructions from your health care provider about what to eat or drink after your procedure.  Return to your normal activities as told by your health care provider. This information is not intended to replace advice given  to you by your health care provider. Make sure you discuss any questions you have with your health care provider. Document Revised: 10/31/2017 Document Reviewed: 10/09/2017 Elsevier Patient Education  [place].

## 2019-07-04 NOTE — H&P (Signed)
Valerie Griffin is an 86 y.o. female.   Chief Complaint: Patient is here for esophagogastroduodenoscopy and dilation of esophageal stricture. HPI: Patient is 86 year old Caucasian female with known large hiatal hernia also has erosive/ulcerative esophagitis complicated by stricture which has been dilated multiple times in the past.  Her last exam was on 06/14/2019.  I was not able to pass a scope across the stricture.  The stricture was dilated to 13.5 mm.  She states she has been able to swallow some better.  Still having difficulty with pills and is not eating meat.  She is maintaining her weight.  She rarely experiences heartburn or regurgitation.  He has history of iron deficiency anemia.  Anemia felt to be due to loss from inflammation and esophagus. Last aspirin dose was 3 days ago.  Past Medical History:  Diagnosis Date  . Anemia   . Arthritis   . Constipation   . Dysphagia 08/16/2016  . GERD (gastroesophageal reflux disease)     Past Surgical History:  Procedure Laterality Date  . ABDOMINAL HYSTERECTOMY    . APPENDECTOMY    . broken arm and wrist with plate and scews rt wrist Right   . CHOLECYSTECTOMY    . COLONOSCOPY    . complete hysterectomy'     fibroid tumors bleeding  . ESOPHAGEAL DILATION N/A 08/22/2016   Procedure: ESOPHAGEAL DILATION;  Surgeon: Rogene Houston, MD;  [facility];  Service: Endoscopy;  Laterality: N/A;  . ESOPHAGEAL DILATION N/A 09/14/2016   Procedure: ESOPHAGEAL DILATION;  Surgeon: Rogene Houston, MD;  [facility];  Service: Endoscopy;  Laterality: N/A;  . ESOPHAGEAL DILATION N/A 07/26/2017   Procedure: ESOPHAGEAL DILATION;  Surgeon: Rogene Houston, MD;  [facility];  Service: Endoscopy;  Laterality: N/A;  . ESOPHAGEAL DILATION N/A 08/31/2017   Procedure: ESOPHAGEAL DILATION;  Surgeon: Rogene Houston, MD;  [facility];  Service: Endoscopy;  Laterality: N/A;  . ESOPHAGEAL DILATION N/A 10/26/2017   Procedure:  ESOPHAGEAL DILATION;  Surgeon: Rogene Houston, MD;  [facility];  Service: Endoscopy;  Laterality: N/A;  . ESOPHAGEAL DILATION N/A 02/07/2018   Procedure: ESOPHAGEAL DILATION;  Surgeon: Rogene Houston, MD;  [facility];  Service: Endoscopy;  Laterality: N/A;  . ESOPHAGEAL DILATION N/A 05/31/2019   Procedure: ESOPHAGEAL DILATION;  Surgeon: Rogene Houston, MD;  [facility];  Service: Endoscopy;  Laterality: N/A;  . ESOPHAGEAL DILATION N/A 06/14/2019   Procedure: ESOPHAGEAL DILATION;  Surgeon: Rogene Houston, MD;  [facility];  Service: Endoscopy;  Laterality: N/A;  . ESOPHAGOGASTRODUODENOSCOPY N/A 08/22/2016   Procedure: ESOPHAGOGASTRODUODENOSCOPY (EGD);  Surgeon: Rogene Houston, MD;  [facility];  Service: Endoscopy;  Laterality: N/A;  . ESOPHAGOGASTRODUODENOSCOPY N/A 09/14/2016   Procedure: ESOPHAGOGASTRODUODENOSCOPY (EGD);  Surgeon: Rogene Houston, MD;  [facility];  Service: Endoscopy;  Laterality: N/A;  1225  . ESOPHAGOGASTRODUODENOSCOPY N/A 07/26/2017   Procedure: ESOPHAGOGASTRODUODENOSCOPY (EGD);  Surgeon: Rogene Houston, MD;  [facility];  Service: Endoscopy;  Laterality: N/A;  1:45  . ESOPHAGOGASTRODUODENOSCOPY N/A 08/31/2017   Procedure: ESOPHAGOGASTRODUODENOSCOPY (EGD);  Surgeon: Rogene Houston, MD;  [facility];  Service: Endoscopy;  Laterality: N/A;  . ESOPHAGOGASTRODUODENOSCOPY N/A 10/26/2017   Procedure: ESOPHAGOGASTRODUODENOSCOPY (EGD);  Surgeon: Rogene Houston, MD;  [facility];  Service: Endoscopy;  Laterality: N/A;  1115  . ESOPHAGOGASTRODUODENOSCOPY N/A 02/07/2018   Procedure: ESOPHAGOGASTRODUODENOSCOPY (EGD);  Surgeon: Rogene Houston, MD;  [facility];  Service: Endoscopy;  Laterality: N/A;  830  . ESOPHAGOGASTRODUODENOSCOPY (EGD) WITH PROPOFOL N/A 05/31/2019   Procedure: ESOPHAGOGASTRODUODENOSCOPY (EGD) WITH PROPOFOL;  Surgeon: Rogene Houston, MD;   [facility];  Service: Endoscopy;  Laterality: N/A;  2:35  . ESOPHAGOGASTRODUODENOSCOPY (EGD) WITH PROPOFOL N/A 06/14/2019   Procedure: ESOPHAGOGASTRODUODENOSCOPY (EGD) WITH PROPOFOL;  Surgeon: Rogene Houston, MD;  [facility];  Service: Endoscopy;  Laterality: N/A;  . FOREIGN BODY REMOVAL  05/31/2019   Procedure: FOREIGN BODY REMOVAL;  Surgeon: Rogene Houston, MD;  [facility];  Service: Endoscopy;;    Family History  Problem Relation Age of Onset  . Heart Problems Mother   . Alzheimer's disease Mother   . Hypertension Mother   . Prostate cancer Father   . Heart attack Father   . Heart Problems Sister    Social History:  reports that she has never smoked. She has never used smokeless tobacco. She reports that she does not drink alcohol or use drugs.  Allergies:  Allergies  Allergen Reactions  . Penicillins Rash and Other (See Comments)    40 years ago, caused rash and tachycardia Has patient had a PCN reaction causing immediate rash, facial/tongue/throat swelling, SOB or lightheadedness with hypotension: No Has patient had a PCN reaction causing severe rash involving mucus membranes or skin necrosis: No Has patient had a PCN reaction that required hospitalization: No Has patient had a PCN reaction occurring within the last 10 years: No If all of the above answers are "NO", then may proceed with Cephalosporin use.     Medications Prior to Admission  Medication Sig Dispense Refill  . aspirin EC 81 MG tablet Take 1 tablet (81 mg total) by mouth daily.    Marland Kitchen BLACK ELDERBERRY PO Take 2 tablets by mouth daily.     . Cholecalciferol (VITAMIN D3) 125 MCG (5000 UT) TABS Take 10,000 Units by mouth daily.     Marland Kitchen docusate sodium (COLACE) 100 MG capsule Take 100 mg by mouth at bedtime.     Marland Kitchen esomeprazole (NEXIUM) 20 MG capsule Take 1 capsule (20 mg total) by mouth 2 (two) times daily before a meal.    . Ferrous Sulfate (IRON) 28 MG TABS Take 28-56 mg by  mouth daily.     . metoCLOPramide (REGLAN) 10 MG tablet Take 10 mg by mouth at bedtime.    . Multiple Vitamins-Minerals (MULTI + OMEGA-3 ADULT GUMMIES) CHEW Chew 1 tablet by mouth daily.       No results found for this or any previous visit (from the past 48 hour(s)). No results found.  Review of Systems  Blood pressure (!) 151/58, pulse 68, temperature 97.9 F (36.6 C), temperature source Oral, resp. rate 15, height 5' 5.5" (1.664 m), SpO2 98 %. Physical Exam  Constitutional: She appears well-developed and well-nourished.  HENT:  Mouth/Throat: Oropharynx is clear and moist.  She has upper and lower dentures  Eyes: Conjunctivae are normal. No scleral icterus.  Neck: No thyromegaly present.  Cardiovascular: Normal rate, regular rhythm and normal heart sounds.  No murmur heard. Respiratory: Effort normal and breath sounds normal.  GI: Soft. She exhibits no distension and no mass. There is no abdominal tenderness.  Musculoskeletal:        General: No edema.  Lymphadenopathy:    She has no cervical adenopathy.  Neurological: She is alert.  Skin: Skin is warm and dry.  Assessment/Plan Esophageal dysphagia secondary to high-grade esophageal stricture secondary to chronic GERD. Esophagogastroduodenoscopy with esophageal dilation.  Hildred Laser, MD 07/04/2019, 7:30 AM

## 2019-07-30 ENCOUNTER — Other Ambulatory Visit (HOSPITAL_COMMUNITY)
Admission: RE | Admit: 2019-07-30 | Discharge: 2019-07-30 | Disposition: A | Discharge status: Home / Self Care | Payer: Medicare HMO | Source: Ambulatory Visit | Attending: Internal Medicine | Admitting: Internal Medicine

## 2019-07-30 ENCOUNTER — Other Ambulatory Visit: Payer: Self-pay

## 2019-07-30 DIAGNOSIS — Z20822 Contact with and (suspected) exposure to covid-19: Secondary | ICD-10-CM | POA: Diagnosis not present

## 2019-07-30 DIAGNOSIS — Z01812 Encounter for preprocedural laboratory examination: Secondary | ICD-10-CM | POA: Insufficient documentation

## 2019-07-30 LAB — SARS CORONAVIRUS 2 (TAT 6-24 HRS): SARS Coronavirus 2: NEGATIVE

## 2019-08-01 ENCOUNTER — Encounter (HOSPITAL_COMMUNITY)
Admission: RE | Disposition: A | Discharge status: Home / Self Care | Payer: Self-pay | Source: Home / Self Care | Attending: Internal Medicine

## 2019-08-01 ENCOUNTER — Ambulatory Visit (HOSPITAL_COMMUNITY)
Admission: RE | Admit: 2019-08-01 | Discharge: 2019-08-01 | Disposition: A | Discharge status: Home / Self Care | Payer: Medicare HMO | Attending: Internal Medicine | Admitting: Internal Medicine

## 2019-08-01 ENCOUNTER — Encounter (HOSPITAL_COMMUNITY): Payer: Self-pay | Admitting: Internal Medicine

## 2019-08-01 ENCOUNTER — Other Ambulatory Visit: Payer: Self-pay

## 2019-08-01 DIAGNOSIS — K222 Esophageal obstruction: Secondary | ICD-10-CM

## 2019-08-01 DIAGNOSIS — Z7982 Long term (current) use of aspirin: Secondary | ICD-10-CM | POA: Insufficient documentation

## 2019-08-01 DIAGNOSIS — M1990 Unspecified osteoarthritis, unspecified site: Secondary | ICD-10-CM | POA: Insufficient documentation

## 2019-08-01 DIAGNOSIS — D509 Iron deficiency anemia, unspecified: Secondary | ICD-10-CM | POA: Diagnosis not present

## 2019-08-01 DIAGNOSIS — Z880 Allergy status to penicillin: Secondary | ICD-10-CM | POA: Diagnosis not present

## 2019-08-01 DIAGNOSIS — K449 Diaphragmatic hernia without obstruction or gangrene: Secondary | ICD-10-CM

## 2019-08-01 DIAGNOSIS — K2100 Gastro-esophageal reflux disease with esophagitis, without bleeding: Secondary | ICD-10-CM | POA: Insufficient documentation

## 2019-08-01 DIAGNOSIS — K209 Esophagitis, unspecified without bleeding: Secondary | ICD-10-CM

## 2019-08-01 DIAGNOSIS — Z79899 Other long term (current) drug therapy: Secondary | ICD-10-CM | POA: Diagnosis not present

## 2019-08-01 DIAGNOSIS — K3189 Other diseases of stomach and duodenum: Secondary | ICD-10-CM | POA: Insufficient documentation

## 2019-08-01 DIAGNOSIS — Z09 Encounter for follow-up examination after completed treatment for conditions other than malignant neoplasm: Secondary | ICD-10-CM

## 2019-08-01 DIAGNOSIS — R1314 Dysphagia, pharyngoesophageal phase: Secondary | ICD-10-CM

## 2019-08-01 HISTORY — PX: ESOPHAGOGASTRODUODENOSCOPY: SHX5428

## 2019-08-01 HISTORY — PX: ESOPHAGEAL DILATION: SHX303

## 2019-08-01 LAB — HEMOGLOBIN AND HEMATOCRIT, BLOOD
HCT: 29 % — ABNORMAL LOW (ref 36.0–46.0)
Hemoglobin: 8.6 g/dL — ABNORMAL LOW (ref 12.0–15.0)

## 2019-08-01 SURGERY — EGD (ESOPHAGOGASTRODUODENOSCOPY)
Anesthesia: Moderate Sedation

## 2019-08-01 MED ORDER — MIDAZOLAM HCL 5 MG/5ML IJ SOLN
INTRAMUSCULAR | Status: DC | PRN
  Administered 2019-08-01: 2 mg via INTRAVENOUS
  Administered 2019-08-01: 1 mg via INTRAVENOUS
  Administered 2019-08-01: 2 mg via INTRAVENOUS

## 2019-08-01 MED ORDER — LIDOCAINE VISCOUS HCL 2 % MT SOLN
OROMUCOSAL | Status: DC | PRN
  Administered 2019-08-01: 1 via OROMUCOSAL

## 2019-08-01 MED ORDER — STERILE WATER FOR IRRIGATION IR SOLN
Status: DC | PRN
  Administered 2019-08-01: 1.5 mL

## 2019-08-01 MED ORDER — MEPERIDINE HCL 50 MG/ML IJ SOLN
INTRAMUSCULAR | Status: AC
  Filled 2019-08-01: qty 1

## 2019-08-01 MED ORDER — MIDAZOLAM HCL 5 MG/5ML IJ SOLN
INTRAMUSCULAR | Status: AC
  Filled 2019-08-01: qty 10

## 2019-08-01 MED ORDER — LIDOCAINE VISCOUS HCL 2 % MT SOLN
OROMUCOSAL | Status: AC
  Filled 2019-08-01: qty 15

## 2019-08-01 MED ORDER — SODIUM CHLORIDE 0.9 % IV SOLN: INTRAVENOUS | Status: DC

## 2019-08-01 MED ORDER — MEPERIDINE HCL 50 MG/ML IJ SOLN
INTRAMUSCULAR | Status: DC | PRN
  Administered 2019-08-01: 25 mg via INTRAVENOUS

## 2019-08-01 NOTE — Discharge Instructions (Signed)
Resume aspirin on 08/04/2019.  Consider chewable aspirin. Resume other medications as before. Mechanical soft diet. No driving for 24 hours. Office visit in 8 weeks.                                                                        Marland KitchenUpper Endoscopy, Adult, Care After  What can I expect after the procedure? After the procedure, it is common to have:  A sore throat.  Mild stomach pain or discomfort.  Bloating.  Nausea. Follow these instructions at home:   Follow instructions from your health care provider about what to eat or drink after your procedure.  Return to your normal activities as told by your health care provider. Ask your health care provider what activities are safe for you.  Take over-the-counter and prescription medicines only as told by your health care provider.  Do not drive for 24 hours if you were given a sedative during your procedure.  Keep all follow-up visits as told by your health care provider. This is important. Contact a health care provider if you have:  A sore throat that lasts longer than one day.  Trouble swallowing. Get help right away if:  You vomit blood or your vomit looks like coffee grounds.  You have: ? A fever. ? Bloody, black, or tarry stools. ? A severe sore throat or you cannot swallow. ? Difficulty breathing. ? Severe pain in your chest or abdomen. Summary  After the procedure, it is common to have a sore throat, mild stomach discomfort, bloating, and nausea.  Do not drive for 24 hours if you were given a sedative during the procedure.  Follow instructions from your health care provider about what to eat or drink after your procedure.  Return to your normal activities as told by your health care provider. This information is not intended to replace advice given to you by your health care provider. Make sure you discuss any questions you have with your health care provider. Document Revised: 10/31/2017 Document  Reviewed: 10/09/2017 Elsevier Patient Education  [place] A soft-food eating plan includes foods that are safe and easy to chew and swallow. Your health care provider or dietitian can help you find foods and flavors that fit into this plan. Follow this plan until your health care provider or dietitian says it is safe to start eating other foods and food textures. What are tips for following this plan? General guidelines   Take small bites of food, or cut food into pieces about  inch or smaller. Bite-sized pieces of food are easier to chew and swallow.  Eat moist foods. Avoid overly dry foods.  Avoid foods that: ? Are difficult to swallow, such as dry, chunky, crispy, or sticky foods. ? Are difficult to chew, such as hard, tough, or stringy foods. ? Contain nuts, seeds, or fruits.  Follow instructions from your dietitian about the types of liquids that are safe for you to swallow. You may be allowed to have: ? Thick liquids only. This includes only liquids that are thicker than honey. ? Thin and thick liquids. This includes all beverages and foods that become liquid at room temperature.  To make thick liquids: ? Purchase a  commercial liquid thickening powder. These are available at grocery stores and pharmacies. ? Mix the thickener into liquids according to instructions on the label. ? Purchase ready-made thickened liquids. ? Thicken soup by pureeing, straining to remove chunks, and adding flour, potato flakes, or corn starch. ? Add commercial thickener to foods that become liquid at room temperature, such as milk shakes, yogurt, ice cream, gelatin, and sherbet.  Ask your health care provider whether you need to take a fiber supplement. Cooking  Cook meats so they stay tender and moist. Use methods like braising, stewing, or baking in liquid.  Cook vegetables and fruit until they are soft enough to be mashed with a fork.  Peel soft, fresh fruits  such as peaches, nectarines, and melons.  When making soup, make sure chunks of meat and vegetables are smaller than  inch.  Reheat leftover foods slowly so that a tough crust does not form. What foods are allowed? The items listed below may not be a complete list. Talk with your dietitian about what dietary choices are best for you. Grains Breads, muffins, pancakes, or waffles moistened with syrup, jelly, or butter. Dry cereals well-moistened with milk. Moist, cooked cereals. Well-cooked pasta and rice. Vegetables All soft-cooked vegetables. Shredded lettuce. Fruits All canned and cooked fruits. Soft, peeled fresh fruits. Strawberries. Dairy Milk. Cream. Yogurt. Cottage cheese. Soft cheese without the rind. Meats and other protein foods Tender, moist ground meat, poultry, or fish. Meat cooked in gravy or sauces. Eggs. Sweets and desserts Ice cream. Milk shakes. Sherbet. Pudding. Fats and oils Butter. Margarine. Olive, canola, sunflower, and grapeseed oil. Smooth salad dressing. Smooth cream cheese. Mayonnaise. Gravy. What foods are not allowed? The items listed bemay not be a complete list. Talk with your dietitian about what dietary choices are best for you. Grains Coarse or dry cereals, such as bran, granola, and shredded wheat. Tough or chewy crusty breads, such as [place] bread or baguettes. Breads with nuts, seeds, or fruit. Vegetables All raw vegetables. Cooked corn. Cooked vegetables that are tough or stringy. Tough, crisp, fried potatoes and potato skins. Fruits Fresh fruits with skins or seeds, or both, such as apples, pears, and grapes. Stringy, high-pulp fruits, such as papaya, pineapple, coconut, and mango. Fruit leather and all dried fruit. Dairy Yogurt with nuts or coconut. Meats and other protein foods Hard, dry sausages. Dry meat, poultry, or fish. Meats with gristle. Fish with bones. Fried meat or fish. Lunch meat and hotdogs. Nuts and seeds. Chunky peanut butter or  other nut butters. Sweets and desserts Cakes or cookies that are very dry or chewy. Desserts with dried fruit, nuts, or coconut. Fried pastries. Very rich pastries. Fats and oils Cream cheese with fruit or nuts. Salad dressings with seeds or chunks. Summary  A soft-food eating plan includes foods that are safe and easy to swallow. Generally, the foods should be soft enough to be mashed with a fork.  Avoid foods that are dry, hard to chew, crunchy, sticky, stringy, or crispy.  Ask your health care provider whether you need to thicken your liquids and if you need to take a fiber supplement. This information is not intended to replace advice given to you by your health care provider. Make sure you discuss any questions you have with your health care provider. Document Revised: 08/30/2018 Document Reviewed: 07/12/2016 Elsevier Patient Education  [place].

## 2019-08-01 NOTE — Op Note (Signed)
[facility] Patient Name: Valerie Griffin Procedure Date: 08/01/2019 1:51 PM MRN: [id number] Date of Birth: 1933/10/11 Attending MD: Hildred Laser , MD CSN: [id number] Age: 86 Admit Type: Outpatient Procedure:                Upper GI endoscopy Indications:              Esophageal dysphagia, Stricture of the esophagus,                            For therapy of esophageal stricture Providers:                Hildred Laser, MD, Charlsie Quest. Theda Sers RN, RN, Nelma Rothman, Technician Referring MD:             Glenda Chroman, MD Medicines:                Lidocaine spray, Meperidine 25 mg IV, Midazolam 5                            mg IV Complications:            No immediate complications. Estimated Blood Loss:     Estimated blood loss was minimal. Procedure:                Pre-Anesthesia Assessment:                           - Prior to the procedure, a History and Physical                            was performed, and patient medications and                            allergies were reviewed. The patient's tolerance of                            previous anesthesia was also reviewed. The risks                            and benefits of the procedure and the sedation                            options and risks were discussed with the patient.                            All questions were answered, and informed consent                            was obtained. Prior Anticoagulants: The patient has                            taken no previous anticoagulant or antiplatelet  agents except for aspirin. ASA Grade Assessment: II                            - A patient with mild systemic disease. After                            reviewing the risks and benefits, the patient was                            deemed in satisfactory condition to undergo the                            procedure.                           After obtaining informed consent, the  endoscope was                            passed under direct vision. Throughout the                            procedure, the patient's blood pressure, pulse, and                            oxygen saturations were monitored continuously. The                            [id number]-[id number] scope was introduced through the                            mouth, and advanced to the second part of duodenum.                            The upper GI endoscopy was accomplished without                            difficulty. The patient tolerated the procedure                            well. Scope In: 2:05:36 PM Scope Out: 2:15:01 PM Total Procedure Duration: 0 hours 9 minutes 25 seconds  Findings:      The hypopharynx was normal.      LA Grade B (one or more mucosal breaks greater than 5 mm, not extending       between the tops of two mucosal folds) esophagitis with no bleeding was       found 25 to 30 cm from the incisors.      One benign-appearing, intrinsic severe stenosis was found 30 cm from the       incisors. The stenosis was traversed. A TTS dilator was passed through       the scope. Dilation with a 15-16.5-18 mm balloon dilator was performed       to 15 mm and 16.5 mm. The dilation site was examined and showed moderate       mucosal disruption, mild improvement in luminal  narrowing and no       perforation.      A 9 cm hiatal hernia was present.      A healed ulcer was found in the prepyloric region of the stomach.      The exam of the stomach was otherwise normal.      The duodenal bulb and second portion of the duodenum were normal. Impression:               - Normal hypopharynx.                           - LA Grade B esophagitis with no bleeding.                           - Benign-appearing esophageal stenosis. Dilated.                           - 9 cm hiatal hernia.                           - Scar in the prepyloric region of the stomach.                           - Normal duodenal  bulb and second portion of the                            duodenum.                           - No specimens collected. Moderate Sedation:      Moderate (conscious) sedation was administered by the endoscopy nurse       and supervised by the endoscopist. The following parameters were       monitored: oxygen saturation, heart rate, blood pressure, CO2       capnography and response to care. Total physician intraservice time was       15 minutes. Recommendation:           - Patient has a contact number available for                            emergencies. The signs and symptoms of potential                            delayed complications were discussed with the                            patient. Return to normal activities tomorrow.                            Written discharge instructions were provided to the                            patient.                           - Mechanical soft diet today.                           -  Continue present medications.                           - No aspirin, ibuprofen, naproxen, or other                            non-steroidal anti-inflammatory drugs for 3 days.                           - Repeat upper endoscopy PRN. Procedure Code(s):        --- Professional ---                           [phone], Esophagogastroduodenoscopy, flexible,                            transoral; with transendoscopic balloon dilation of                            esophagus (less than 30 mm diameter)                           G0500, Moderate sedation services provided by the                            same physician or other qualified health care                            professional performing a gastrointestinal                            endoscopic service that sedation supports,                            requiring the presence of an independent trained                            observer to assist in the monitoring of the                            patient's level of  consciousness and physiological                            status; initial 15 minutes of intra-service time;                            patient age 106 years or older (additional time may                            be reported with [phone], as appropriate) Diagnosis Code(s):        --- Professional ---                           K20.90, Esophagitis, unspecified without bleeding  K22.2, Esophageal obstruction                           K44.9, Diaphragmatic hernia without obstruction or                            gangrene                           K31.89, Other diseases of stomach and duodenum                           R13.14, Dysphagia, pharyngoesophageal phase CPT copyright 2019 American Medical Association. All rights reserved. The codes documented in this report are preliminary and upon coder review may  be revised to meet current compliance requirements. Hildred Laser, MD Hildred Laser, MD 08/01/2019 2:29:18 PM This report has been signed electronically. Number of Addenda: 0

## 2019-08-01 NOTE — H&P (Signed)
Valerie Griffin is an 86 y.o. female.   Chief Complaint: Patient is here for esophagogastroduodenoscopy with esophageal dilation. HPI: Patient is 86 year old Caucasian female who was distal esophageal stricture secondary to chronic GERD which has been difficult to keep open.  This year she has undergone 2 dilations.  Last dilation was 1 month ago when stricture was dilated to 15 mm the balloon.  Each time I was not able to advance the scope across it.  She says she has been able to swallow better since her last exam.  She is maintaining her weight.  She has not had a food impaction.  She does report intermittent right lower quadrant abdominal pain.  She has 1 formed stool daily.  She feels heartburn is well controlled with therapy.  Past Medical History:  Diagnosis Date  . Anemia   . Arthritis   . Constipation   .  Distal esophageal stricture. 08/16/2016  . GERD (gastroesophageal reflux disease)     Past Surgical History:  Procedure Laterality Date  . ABDOMINAL HYSTERECTOMY    . APPENDECTOMY    . broken arm and wrist with plate and scews rt wrist Right   . CHOLECYSTECTOMY    . COLONOSCOPY    . complete hysterectomy'     fibroid tumors bleeding  . ESOPHAGEAL DILATION N/A 08/22/2016   Procedure: ESOPHAGEAL DILATION;  Surgeon: Rogene Houston, MD;  [facility];  Service: Endoscopy;  Laterality: N/A;  . ESOPHAGEAL DILATION N/A 09/14/2016   Procedure: ESOPHAGEAL DILATION;  Surgeon: Rogene Houston, MD;  [facility];  Service: Endoscopy;  Laterality: N/A;  . ESOPHAGEAL DILATION N/A 07/26/2017   Procedure: ESOPHAGEAL DILATION;  Surgeon: Rogene Houston, MD;  [facility];  Service: Endoscopy;  Laterality: N/A;  . ESOPHAGEAL DILATION N/A 08/31/2017   Procedure: ESOPHAGEAL DILATION;  Surgeon: Rogene Houston, MD;  [facility];  Service: Endoscopy;  Laterality: N/A;  . ESOPHAGEAL DILATION N/A 10/26/2017   Procedure: ESOPHAGEAL DILATION;  Surgeon: Rogene Houston, MD;  [facility];  Service: Endoscopy;  Laterality: N/A;  . ESOPHAGEAL DILATION N/A 02/07/2018   Procedure: ESOPHAGEAL DILATION;  Surgeon: Rogene Houston, MD;  [facility];  Service: Endoscopy;  Laterality: N/A;  . ESOPHAGEAL DILATION N/A 05/31/2019   Procedure: ESOPHAGEAL DILATION;  Surgeon: Rogene Houston, MD;  [facility];  Service: Endoscopy;  Laterality: N/A;  . ESOPHAGEAL DILATION N/A 06/14/2019   Procedure: ESOPHAGEAL DILATION;  Surgeon: Rogene Houston, MD;  [facility];  Service: Endoscopy;  Laterality: N/A;  . ESOPHAGEAL DILATION N/A 07/04/2019   Procedure: ESOPHAGEAL DILATION;  Surgeon: Rogene Houston, MD;  [facility];  Service: Endoscopy;  Laterality: N/A;  730  . ESOPHAGOGASTRODUODENOSCOPY N/A 08/22/2016   Procedure: ESOPHAGOGASTRODUODENOSCOPY (EGD);  Surgeon: Rogene Houston, MD;  [facility];  Service: Endoscopy;  Laterality: N/A;  . ESOPHAGOGASTRODUODENOSCOPY N/A 09/14/2016   Procedure: ESOPHAGOGASTRODUODENOSCOPY (EGD);  Surgeon: Rogene Houston, MD;  [facility];  Service: Endoscopy;  Laterality: N/A;  1225  . ESOPHAGOGASTRODUODENOSCOPY N/A 07/26/2017   Procedure: ESOPHAGOGASTRODUODENOSCOPY (EGD);  Surgeon: Rogene Houston, MD;  [facility];  Service: Endoscopy;  Laterality: N/A;  1:45  . ESOPHAGOGASTRODUODENOSCOPY N/A 08/31/2017   Procedure: ESOPHAGOGASTRODUODENOSCOPY (EGD);  Surgeon: Rogene Houston, MD;  [facility];  Service: Endoscopy;  Laterality: N/A;  . ESOPHAGOGASTRODUODENOSCOPY N/A 10/26/2017   Procedure: ESOPHAGOGASTRODUODENOSCOPY (EGD);  Surgeon: Rogene Houston, MD;  [facility];  Service: Endoscopy;  Laterality: N/A;  1115  . ESOPHAGOGASTRODUODENOSCOPY N/A 02/07/2018   Procedure: ESOPHAGOGASTRODUODENOSCOPY (EGD);  Surgeon: Rogene Houston, MD;  [facility];  Service: Endoscopy;  Laterality: N/A;  830  .  ESOPHAGOGASTRODUODENOSCOPY N/A 07/04/2019   Procedure: ESOPHAGOGASTRODUODENOSCOPY (EGD);  Surgeon: Rogene Houston, MD;  [facility];  Service: Endoscopy;  Laterality: N/A;  730  . ESOPHAGOGASTRODUODENOSCOPY (EGD) WITH PROPOFOL N/A 05/31/2019   Procedure: ESOPHAGOGASTRODUODENOSCOPY (EGD) WITH PROPOFOL;  Surgeon: Rogene Houston, MD;  [facility];  Service: Endoscopy;  Laterality: N/A;  2:35  . ESOPHAGOGASTRODUODENOSCOPY (EGD) WITH PROPOFOL N/A 06/14/2019   Procedure: ESOPHAGOGASTRODUODENOSCOPY (EGD) WITH PROPOFOL;  Surgeon: Rogene Houston, MD;  [facility];  Service: Endoscopy;  Laterality: N/A;  . FOREIGN BODY REMOVAL  05/31/2019   Procedure: FOREIGN BODY REMOVAL;  Surgeon: Rogene Houston, MD;  [facility];  Service: Endoscopy;;    Family History  Problem Relation Age of Onset  . Heart Problems Mother   . Alzheimer's disease Mother   . Hypertension Mother   . Prostate cancer Father   . Heart attack Father   . Heart Problems Sister    Social History:  reports that she has never smoked. She has never used smokeless tobacco. She reports that she does not drink alcohol or use drugs.  Allergies:  Allergies  Allergen Reactions  . Penicillins Rash and Other (See Comments)    40 years ago, caused rash and tachycardia Has patient had a PCN reaction causing immediate rash, facial/tongue/throat swelling, SOB or lightheadedness with hypotension: No Has patient had a PCN reaction causing severe rash involving mucus membranes or skin necrosis: No Has patient had a PCN reaction that required hospitalization: No Has patient had a PCN reaction occurring within the last 10 years: No If all of the above answers are "NO", then may proceed with Cephalosporin use.     Medications Prior to Admission  Medication Sig Dispense Refill  . aspirin EC 81 MG tablet Take 81 mg by mouth every evening.     Marland Kitchen BLACK ELDERBERRY PO Take 1,250 mg by mouth in the morning  and at bedtime.     . Cholecalciferol (VITAMIN D) 50 MCG (2000 UT) tablet Take 2,000-4,000 Units by mouth every evening.    . docusate sodium (COLACE) 100 MG capsule Take 200-300 mg by mouth at bedtime.    Marland Kitchen esomeprazole (NEXIUM) 20 MG capsule Take 1 capsule (20 mg total) by mouth 2 (two) times daily before a meal.    . Ferrous Sulfate (IRON) 28 MG TABS Take 28 mg by mouth in the morning and at bedtime.     . Multiple Vitamin (MULTIVITAMIN WITH MINERALS) TABS tablet Take 1 tablet by mouth at bedtime. Centrum Silver    . Specialty Vitamins Products [facility] CENTURY ENERGY METABOLISM) TABS Take 1 tablet by mouth daily. Women's Ultram Energy & Metabolism    . TURMERIC CURCUMIN PO Take 1 tablet by mouth daily.      No results found for this or any previous visit (from the past 48 hour(s)). No results found.  Review of Systems  Blood pressure (!) 177/62, pulse 87, temperature 97.9 F (36.6 C), temperature source Oral, resp. rate 20, height 5' 5.5" (1.664 m), weight 82.6 kg, SpO2 100 %. Physical Exam  Constitutional: She appears well-developed and well-nourished.  HENT:  Mouth/Throat: Oropharynx is clear and moist.  Eyes:  Conjunctivae are normal. No scleral icterus.  Neck: No thyromegaly present.  Cardiovascular: Normal rate, regular rhythm and normal heart sounds.  No murmur heard. Respiratory: Effort normal and breath sounds normal.  GI:  Abdomen is symmetrical and soft.  She has mild tenderness in RLQ.  No organomegaly or masses.  Musculoskeletal:        General: No edema.  Lymphadenopathy:    She has no cervical adenopathy.  Neurological: She is alert.  Skin: Skin is warm and dry.     Assessment/Plan Esophageal dysphagia secondary to distal esophageal stricture Iron deficiency anemia.  Will check H&H today. Esophagogastroduodenoscopy with esophageal dilation today.   Hildred Laser, MD 08/01/2019, 1:53 PM

## 2019-10-29 ENCOUNTER — Ambulatory Visit (INDEPENDENT_AMBULATORY_CARE_PROVIDER_SITE_OTHER): Payer: Medicare HMO | Admitting: Internal Medicine

## 2019-10-29 ENCOUNTER — Other Ambulatory Visit: Payer: Self-pay

## 2019-10-29 ENCOUNTER — Encounter (INDEPENDENT_AMBULATORY_CARE_PROVIDER_SITE_OTHER): Payer: Self-pay | Admitting: Internal Medicine

## 2019-10-29 VITALS — BP 127/79 | HR 62 | Temp 97.0°F | Ht 65.5 in | Wt 184.7 lb

## 2019-10-29 DIAGNOSIS — K2100 Gastro-esophageal reflux disease with esophagitis, without bleeding: Secondary | ICD-10-CM | POA: Diagnosis not present

## 2019-10-29 DIAGNOSIS — D500 Iron deficiency anemia secondary to blood loss (chronic): Secondary | ICD-10-CM | POA: Diagnosis not present

## 2019-10-29 DIAGNOSIS — K222 Esophageal obstruction: Secondary | ICD-10-CM

## 2019-10-29 NOTE — Patient Instructions (Signed)
Will request copy of stool test and blood work from Dr. Marcial Pacas office. Please call office if swallowing difficulty worsens.

## 2019-10-29 NOTE — Progress Notes (Signed)
Presenting complaint;  Follow-up for esophageal stricture and iron deficiency anemia.  Database and subjective:  Patient is 86 year old Caucasian female who has chronic GERD complicated by short segment Barrett's esophagus and grade D esophagitis with esophageal stricture requiring multiple dilations.  Last EGD with EGD was in March 2021.  Stricture was dilated to 16.5 mm.  She also has history of iron deficiency anemia felt to be due to chronic GI blood loss from upper GI tract.  She did experience melena several months ago. Patient is here for scheduled visit. She says she was able to swallow much better for few weeks after her last dilation.  She has noticed some difficulty but she has not reached the point that her esophagus needs to be dilated again.  She feels heartburn is well controlled with therapy.  She states she breaks bigger pills and to half.  She says her appetite is good.  Last year she had dropped weight down 280 pounds.  She began to drink supplements and got up to 187 pounds and now trying to maintain it.  She has lost 3 pounds since her last visit of December 2020. She states her hemoglobin last month had dropped to 6.5 g.  She received 2 units of PRBCs and feels so much better.  She states she did have stool testing by Dr. Woody Seller but does not know the result. She denies melena or rectal bleeding. Last colonoscopy was by Dr. Britta Mccreedy few years ago. Family history is negative for CRC.  Current Medications: Outpatient Encounter Medications as of 10/29/2019  Medication Sig  . aspirin EC 81 MG tablet Take 1 tablet (81 mg total) by mouth every evening.  Marland Kitchen BLACK ELDERBERRY PO Take 1,250 mg by mouth in the morning and at bedtime.   . Cholecalciferol (VITAMIN D) 50 MCG (2000 UT) tablet Take 2,000 Units by mouth every evening.   . docusate sodium (COLACE) 100 MG capsule Take 200-300 mg by mouth at bedtime.  Marland Kitchen esomeprazole (NEXIUM) 20 MG capsule Take 1 capsule (20 mg total) by mouth 2 (two)  times daily before a meal.  . Ferrous Sulfate (IRON) 28 MG TABS Take 28 mg by mouth in the morning and at bedtime.   . Flaxseed, Linseed, (FLAX SEEDS PO) Take by mouth daily. This is oil form and the patient takes 540 mL daily.  . Multiple Vitamins-Minerals (CENTRUM SILVER PO) Take by mouth daily.  . Multiple Vitamins-Minerals (HAIR SKIN NAILS PO) Take by mouth in the morning, at noon, and at bedtime.  . Multiple Vitamins-Minerals (PRESERVISION AREDS 2 PO) Take by mouth in the morning and at bedtime.  . TURMERIC CURCUMIN PO Take 1 tablet by mouth daily.  . [DISCONTINUED] Multiple Vitamin (MULTIVITAMIN WITH MINERALS) TABS tablet Take 1 tablet by mouth at bedtime. Centrum Silver  . [DISCONTINUED] Specialty Vitamins Products [facility] CENTURY ENERGY METABOLISM) TABS Take 1 tablet by mouth daily. Women's Ultram Energy & Metabolism   No facility-administered encounter medications on file as of 10/29/2019.     Objective: Blood pressure 127/79, pulse 62, temperature (!) 97 F (36.1 C), temperature source Temporal, height 5' 5.5" (1.664 m), weight 184 lb 11.2 oz (83.8 kg). Patient is alert and in no acute distress. She is wearing a mask. Conjunctiva is pink. Sclera is nonicteric Oropharyngeal mucosa is normal. She has upper and lower dentures in place. No neck masses or thyromegaly noted. Cardiac exam with regular rhythm normal S1 and S2. No murmur or gallop noted. Lungs are clear to auscultation. Abdomen  is full.  She has upper and lower midline scars.  Abdomen is soft.  She has mild tenderness in RLQ.  No masses or organomegaly. No LE edema or clubbing noted.  Labs/studies Results:  CBC Latest Ref Rng & Units 08/01/2019 05/29/2019 05/09/2019  WBC 4.0 - 10.5 K/uL - 6.1 5.9  Hemoglobin 12.0 - 15.0 g/dL 8.6(L) 8.0(L) 8.1(L)  Hematocrit 36.0 - 46.0 % 29.0(L) 27.9(L) 26.5(L)  Platelets 150 - 400 K/uL - 367 334    CMP Latest Ref Rng & Units 05/09/2019 11/21/2017  Glucose 65 - 139 mg/dL 93 103(H)  BUN 7  - 25 mg/dL 12 14  Creatinine 0.60 - 0.88 mg/dL 1.02(H) 1.30(H)  Sodium 135 - 146 mmol/L 141 138  Potassium 3.5 - 5.3 mmol/L 4.3 4.3  Chloride 98 - 110 mmol/L 107 104  CO2 20 - 32 mmol/L 23 -  Calcium 8.6 - 10.4 mg/dL 9.2 -  Total Protein 6.1 - 8.1 g/dL 6.8 -  Total Bilirubin 0.2 - 1.2 mg/dL 0.5 -  AST 10 - 35 U/L 19 -  ALT 6 - 29 U/L 9 -    Recent hemoglobin unknown.  Assessment:  #1.  Chronic GERD complicated by refractory esophagitis and high-grade stricture which has required multiple dilations.  She is having some dysphagia but not to the point that she needs to have her stricture dilated.  She also has short segment Barrett's esophagus.  #2.  History of iron deficiency anemia felt to be due to chronic blood loss from upper GI tract.  She received 2 units of PRBCs last month. She has not experienced melena or rectal bleeding this year.   Plan:  Request copy of recent blood work and stool test from Dr. Marcial Pacas office. Request last colonoscopy records from [facility]. Patient will continue Nexium 20 mg p.o. twice daily and antireflux measures as before. Patient will call if dysphagia worsens in which case we will proceed with EGD with esophageal dilation. Office visit in 6 months.

## 2020-05-05 ENCOUNTER — Ambulatory Visit (INDEPENDENT_AMBULATORY_CARE_PROVIDER_SITE_OTHER): Payer: Medicare Other | Admitting: Internal Medicine

## 2020-05-05 ENCOUNTER — Other Ambulatory Visit: Payer: Self-pay

## 2020-05-05 ENCOUNTER — Encounter (INDEPENDENT_AMBULATORY_CARE_PROVIDER_SITE_OTHER): Payer: Self-pay | Admitting: Internal Medicine

## 2020-05-05 VITALS — BP 130/72 | HR 87 | Temp 97.9°F | Ht 65.5 in | Wt 198.0 lb

## 2020-05-05 DIAGNOSIS — R1319 Other dysphagia: Secondary | ICD-10-CM | POA: Diagnosis not present

## 2020-05-05 DIAGNOSIS — D508 Other iron deficiency anemias: Secondary | ICD-10-CM

## 2020-05-05 DIAGNOSIS — K222 Esophageal obstruction: Secondary | ICD-10-CM | POA: Diagnosis not present

## 2020-05-05 DIAGNOSIS — K2100 Gastro-esophageal reflux disease with esophagitis, without bleeding: Secondary | ICD-10-CM | POA: Diagnosis not present

## 2020-05-05 MED ORDER — BENEFIBER DRINK MIX PO PACK: 4.0000 g | PACK | Freq: Every day | ORAL | Status: DC

## 2020-05-05 NOTE — Patient Instructions (Signed)
Benefiber 4 g by mouth daily. Can switch to Flintstone chewable with iron when you finish current iron preparation. Esophagogastroduodenoscopy with esophageal dilation to be scheduled. Will check hemoglobin and hematocrit at the time of EGD.

## 2020-05-05 NOTE — Progress Notes (Signed)
Presenting complaint;  Swallowing difficulty.  History of iron deficiency anemia.  Database and subjective:  Patient is 86 year old Caucasian female who was several year history of GERD complicated by Barrett's esophagus and recalcitrant distal esophageal stricture which has been dilated on multiple occasions most recently in March 2021 he also has history of iron deficiency anemia and is here for scheduled visit.  She was last seen on 10/29/2019.  She received 2 units of PRBCs back in May 2021.  Patient told me that she had colonoscopy by Dr. Britta Mccreedy few years ago but unchecking records I found that she had a colonoscopy by me in January 2011 and did have a single small adenoma removed. Patient states she is not doing well.  She states her swallowing difficulty has gotten a lot worse.  She is now only able to swallow full liquids.  She is having difficulty with pills.  She is also having difficulty eating mandrin oranges which she has been able to do in the past.  She is drinking 3 to 4 cans of boost every day.  She says she also munches on cheatoes which might explain weight gain of 14 pounds since her last visit 6 months ago. She denies melena or rectal bleeding.  Her bowels move regularly.  Lately she has not had much heartburn and is not taking PPI on regular basis. Given her swallowing difficulty she does not feel that she will be able to tolerate prep for colonoscopy.  She feels she has reached a point that her esophagus needs to be dilated.  Current Medications: Outpatient Encounter Medications as of 05/05/2020  Medication Sig  . aspirin EC 81 MG tablet Take 1 tablet (81 mg total) by mouth every evening.  . Cholecalciferol (VITAMIN D) 50 MCG (2000 UT) tablet Take 2,000 Units by mouth every evening.   . docusate sodium (COLACE) 100 MG capsule Take 200-300 mg by mouth at bedtime.  Marland Kitchen esomeprazole (NEXIUM) 20 MG capsule Take 1 capsule (20 mg total) by mouth 2 (two) times daily before a meal.  .  Ferrous Sulfate (IRON) 28 MG TABS Take 28 mg by mouth in the morning and at bedtime.   . Flaxseed, Linseed, (FLAX SEEDS PO) Take by mouth daily. This is oil form and the patient takes 540 mL daily.  . Multiple Vitamins-Minerals (CENTRUM SILVER PO) Take by mouth daily.  . TURMERIC CURCUMIN PO Take 1 tablet by mouth daily.  . [DISCONTINUED] BLACK ELDERBERRY PO Take 1,250 mg by mouth in the morning and at bedtime.  (Patient not taking: Reported on 05/05/2020)  . [DISCONTINUED] Multiple Vitamins-Minerals (HAIR SKIN NAILS PO) Take by mouth in the morning, at noon, and at bedtime. (Patient not taking: Reported on 05/05/2020)  . [DISCONTINUED] Multiple Vitamins-Minerals (PRESERVISION AREDS 2 PO) Take by mouth in the morning and at bedtime. (Patient not taking: Reported on 05/05/2020)   No facility-administered encounter medications on file as of 05/05/2020.     Objective: Blood pressure 130/72, pulse 87, temperature 97.9 F (36.6 C), temperature source Oral, height 5' 5.5" (1.664 m), weight 198 lb (89.8 kg). Patient is alert and in no acute distress. She is wearing a mask. Conjunctiva is pink. Sclera is nonicteric Oropharyngeal mucosa is normal. She has upper and lower dentures in place. No neck masses or thyromegaly noted. Cardiac exam with regular rhythm normal S1 and S2. No murmur or gallop noted. Lungs are clear to auscultation. Abdomen is symmetrical.  Abdomen is soft with mild tenderness in right lower quadrant.  No organomegaly or masses. No LE edema or clubbing noted.  Labs/studies Results:  CBC Latest Ref Rng & Units 08/01/2019 05/29/2019 05/09/2019  WBC 4.0 - 10.5 K/uL - 6.1 5.9  Hemoglobin 12.0 - 15.0 g/dL 8.6(L) 8.0(L) 8.1(L)  Hematocrit 36.0 - 46.0 % 29.0(L) 27.9(L) 26.5(L)  Platelets 150 - 400 K/uL - 367 334    CMP Latest Ref Rng & Units 05/09/2019 11/21/2017  Glucose 65 - 139 mg/dL 93 103(H)  BUN 7 - 25 mg/dL 12 14  Creatinine 0.60 - 0.88 mg/dL 1.02(H) 1.30(H)  Sodium 135 -  146 mmol/L 141 138  Potassium 3.5 - 5.3 mmol/L 4.3 4.3  Chloride 98 - 110 mmol/L 107 104  CO2 20 - 32 mmol/L 23 -  Calcium 8.6 - 10.4 mg/dL 9.2 -  Total Protein 6.1 - 8.1 g/dL 6.8 -  Total Bilirubin 0.2 - 1.2 mg/dL 0.5 -  AST 10 - 35 U/L 19 -  ALT 6 - 29 U/L 9 -    Hepatic Function Latest Ref Rng & Units 05/09/2019  Total Protein 6.1 - 8.1 g/dL 6.8  AST 10 - 35 U/L 19  ALT 6 - 29 U/L 9  Total Bilirubin 0.2 - 1.2 mg/dL 0.5   Copy of this blood work was provided by the patient.  H&H was 10.1 and 32.6 on 12/20/2019. H&H was 10.5 and 32.5 on 02/17/2020 and MCV was 90. H&H 8.5 and 26.9 on 04/29/2020.  WBC was 4.6 and platelet count 312 and MCV was 84.   Assessment:  #1.  High-grade distal esophageal stricture resulting from perpetual ulcerative esophagitis.  Esophageal stenting would be appropriate in the setting but patient is not interested.  She is willing to proceed with esophageal dilation as long as covered by her insurance.  #2.  Chronic GERD.  Her esophagitis has not healed on multiple EGDs over the last few years.  #3.  History of Barrett's esophagus.  Esophageal biopsy in June 2019 was negative for dysplasia.  #4.  Iron deficiency anemia.  I suspect blood loss due to ulcerative/erosive reflux esophagitis.  She certainly could be losing blood from her small or large bowel.  She has a history of colonic adenoma and long overdue for surveillance colonoscopy.  She wants to wait as far as colonoscopy is concerned.   Plan:  Patient advised to take Nexium 20 mg p.o. twice daily. Benefiber 4 g p.o. daily if she is able to tolerate it in order to prevent constipation. Esophagogastroduodenoscopy with esophageal dilation in near future. Will check H&H at the time of EGD. Patient advised to switch to Flintstone chewable with iron 1 to 2 tablets a day when she runs out of current iron preparation. Office visit date to to be determined based on endoscopic findings.

## 2020-05-06 ENCOUNTER — Telehealth (INDEPENDENT_AMBULATORY_CARE_PROVIDER_SITE_OTHER): Payer: Self-pay

## 2020-05-06 ENCOUNTER — Other Ambulatory Visit (INDEPENDENT_AMBULATORY_CARE_PROVIDER_SITE_OTHER): Payer: Self-pay

## 2020-05-06 DIAGNOSIS — K222 Esophageal obstruction: Secondary | ICD-10-CM

## 2020-05-06 DIAGNOSIS — R1319 Other dysphagia: Secondary | ICD-10-CM

## 2020-05-06 DIAGNOSIS — K2100 Gastro-esophageal reflux disease with esophagitis, without bleeding: Secondary | ICD-10-CM

## 2020-05-07 NOTE — Telephone Encounter (Signed)
Valerie Griffin, CMA  

## 2020-05-11 ENCOUNTER — Other Ambulatory Visit: Payer: Self-pay

## 2020-05-11 ENCOUNTER — Other Ambulatory Visit (HOSPITAL_COMMUNITY)
Admission: RE | Admit: 2020-05-11 | Discharge: 2020-05-11 | Disposition: A | Discharge status: Home / Self Care | Payer: Medicare Other | Source: Ambulatory Visit | Attending: Internal Medicine | Admitting: Internal Medicine

## 2020-05-11 DIAGNOSIS — Z01812 Encounter for preprocedural laboratory examination: Secondary | ICD-10-CM | POA: Diagnosis present

## 2020-05-11 DIAGNOSIS — Z20822 Contact with and (suspected) exposure to covid-19: Secondary | ICD-10-CM | POA: Insufficient documentation

## 2020-05-11 LAB — SARS CORONAVIRUS 2 (TAT 6-24 HRS): SARS Coronavirus 2: NEGATIVE

## 2020-05-12 ENCOUNTER — Other Ambulatory Visit (INDEPENDENT_AMBULATORY_CARE_PROVIDER_SITE_OTHER): Payer: Self-pay

## 2020-05-14 ENCOUNTER — Encounter (HOSPITAL_COMMUNITY)
Admission: RE | Disposition: A | Discharge status: Home / Self Care | Payer: Self-pay | Source: Home / Self Care | Attending: Internal Medicine

## 2020-05-14 ENCOUNTER — Telehealth (INDEPENDENT_AMBULATORY_CARE_PROVIDER_SITE_OTHER): Payer: Self-pay | Admitting: *Deleted

## 2020-05-14 ENCOUNTER — Other Ambulatory Visit: Payer: Self-pay

## 2020-05-14 ENCOUNTER — Encounter (HOSPITAL_COMMUNITY): Payer: Self-pay | Admitting: Internal Medicine

## 2020-05-14 ENCOUNTER — Ambulatory Visit (HOSPITAL_COMMUNITY)
Admission: RE | Admit: 2020-05-14 | Discharge: 2020-05-14 | Disposition: A | Discharge status: Home / Self Care | Payer: Medicare Other | Attending: Internal Medicine | Admitting: Internal Medicine

## 2020-05-14 DIAGNOSIS — R1314 Dysphagia, pharyngoesophageal phase: Secondary | ICD-10-CM | POA: Insufficient documentation

## 2020-05-14 DIAGNOSIS — K2100 Gastro-esophageal reflux disease with esophagitis, without bleeding: Secondary | ICD-10-CM | POA: Diagnosis not present

## 2020-05-14 DIAGNOSIS — Z880 Allergy status to penicillin: Secondary | ICD-10-CM | POA: Insufficient documentation

## 2020-05-14 DIAGNOSIS — Z8719 Personal history of other diseases of the digestive system: Secondary | ICD-10-CM | POA: Insufficient documentation

## 2020-05-14 DIAGNOSIS — Z90710 Acquired absence of both cervix and uterus: Secondary | ICD-10-CM | POA: Insufficient documentation

## 2020-05-14 DIAGNOSIS — Z9049 Acquired absence of other specified parts of digestive tract: Secondary | ICD-10-CM | POA: Diagnosis not present

## 2020-05-14 DIAGNOSIS — Z79899 Other long term (current) drug therapy: Secondary | ICD-10-CM | POA: Diagnosis not present

## 2020-05-14 DIAGNOSIS — K222 Esophageal obstruction: Secondary | ICD-10-CM | POA: Diagnosis not present

## 2020-05-14 DIAGNOSIS — Z7982 Long term (current) use of aspirin: Secondary | ICD-10-CM | POA: Diagnosis not present

## 2020-05-14 DIAGNOSIS — R1319 Other dysphagia: Secondary | ICD-10-CM

## 2020-05-14 DIAGNOSIS — K449 Diaphragmatic hernia without obstruction or gangrene: Secondary | ICD-10-CM | POA: Insufficient documentation

## 2020-05-14 HISTORY — PX: ESOPHAGOGASTRODUODENOSCOPY: SHX5428

## 2020-05-14 HISTORY — PX: ESOPHAGEAL DILATION: SHX303

## 2020-05-14 SURGERY — EGD (ESOPHAGOGASTRODUODENOSCOPY)
Anesthesia: Moderate Sedation

## 2020-05-14 MED ORDER — MEPERIDINE HCL 50 MG/ML IJ SOLN
INTRAMUSCULAR | Status: AC
  Filled 2020-05-14: qty 1

## 2020-05-14 MED ORDER — STERILE WATER FOR IRRIGATION IR SOLN
Status: DC | PRN
  Administered 2020-05-14: 12:00:00 1.5 mL

## 2020-05-14 MED ORDER — MEPERIDINE HCL 50 MG/ML IJ SOLN
INTRAMUSCULAR | Status: DC | PRN
  Administered 2020-05-14 (×2): 20 mg via INTRAVENOUS
  Administered 2020-05-14: 10 mg via INTRAVENOUS

## 2020-05-14 MED ORDER — MIDAZOLAM HCL 5 MG/5ML IJ SOLN
INTRAMUSCULAR | Status: AC
  Filled 2020-05-14: qty 10

## 2020-05-14 MED ORDER — LIDOCAINE VISCOUS HCL 2 % MT SOLN
OROMUCOSAL | Status: AC
  Filled 2020-05-14: qty 15

## 2020-05-14 MED ORDER — LIDOCAINE VISCOUS HCL 2 % MT SOLN
OROMUCOSAL | Status: DC | PRN
  Administered 2020-05-14: 5 mL via OROMUCOSAL

## 2020-05-14 MED ORDER — MIDAZOLAM HCL 5 MG/5ML IJ SOLN
INTRAMUSCULAR | Status: DC | PRN
  Administered 2020-05-14: 1 mg via INTRAVENOUS
  Administered 2020-05-14: 2 mg via INTRAVENOUS
  Administered 2020-05-14 (×2): 1 mg via INTRAVENOUS

## 2020-05-14 MED ORDER — SODIUM CHLORIDE 0.9 % IV SOLN: INTRAVENOUS | Status: DC

## 2020-05-14 MED ORDER — ASPIRIN EC 81 MG PO TBEC: 81.0000 mg | DELAYED_RELEASE_TABLET | Freq: Every evening | ORAL | 11 refills | Status: DC

## 2020-05-14 NOTE — Discharge Instructions (Signed)
Resume aspirin on 05/17/2020. Resume other medications as before. Mechanical soft diet. No driving for 24 hours. Repeat dilation in 4 weeks.  Upper Endoscopy, Adult, Care After This sheet gives you information about how to care for yourself after your procedure. Your health care provider may also give you more specific instructions. If you have problems or questions, contact your health care provider. What can I expect after the procedure? After the procedure, it is common to have:  A sore throat.  Mild stomach pain or discomfort.  Bloating.  Nausea. Follow these instructions at home:   Follow instructions from your health care provider about what to eat or drink after your procedure.  Return to your normal activities as told by your health care provider. Ask your health care provider what activities are safe for you.  Take over-the-counter and prescription medicines only as told by your health care provider.  Do not drive for 24 hours if you were given a sedative during your procedure.  Keep all follow-up visits as told by your health care provider. This is important. Contact a health care provider if you have:  A sore throat that lasts longer than one day.  Trouble swallowing. Get help right away if:  You vomit blood or your vomit looks like coffee grounds.  You have: ? A fever. ? Bloody, black, or tarry stools. ? A severe sore throat or you cannot swallow. ? Difficulty breathing. ? Severe pain in your chest or abdomen. Summary  After the procedure, it is common to have a sore throat, mild stomach discomfort, bloating, and nausea.  Do not drive for 24 hours if you were given a sedative during the procedure.  Follow instructions from your health care provider about what to eat or drink after your procedure.  Return to your normal activities as told by your health care provider. This information is not intended to replace advice given to you by your health care  provider. Make sure you discuss any questions you have with your health care provider. Document Revised: 10/31/2017 Document Reviewed: 10/09/2017 Elsevier Patient Education  2020 [employer]  Hiatal Hernia  A hiatal hernia occurs when part of the stomach slides above the muscle that separates the abdomen from the chest (diaphragm). A person can be born with a hiatal hernia (congenital), or it may develop over time. In almost all cases of hiatal hernia, only the top part of the stomach pushes through the diaphragm. Many people have a hiatal hernia with no symptoms. The larger the hernia, the more likely it is that you will have symptoms. In some cases, a hiatal hernia allows stomach acid to flow back into the tube that carries food from your mouth to your stomach (esophagus). This may cause heartburn symptoms. Severe heartburn symptoms may mean that you have developed a condition called gastroesophageal reflux disease (GERD). What are the causes? This condition is caused by a weakness in the opening (hiatus) where the esophagus passes through the diaphragm to attach to the upper part of the stomach. A person may be born with a weakness in the hiatus, or a weakness can develop over time. What increases the risk? This condition is more likely to develop in:  Older people. Age is a major risk factor for a hiatal hernia, especially if you are over the age of 78.  Pregnant women.  People who are overweight.  People who have frequent constipation. What are the signs or symptoms? Symptoms of this condition usually develop in  the form of GERD symptoms. Symptoms include:  Heartburn.  Belching.  Indigestion.  Trouble swallowing.  Coughing or wheezing.  Sore throat.  Hoarseness.  Chest pain.  Nausea and vomiting. How is this diagnosed? This condition may be diagnosed during testing for GERD. Tests that may be done include:  X-rays of your stomach or chest.  An upper  gastrointestinal (GI) series. This is an X-ray exam of your GI tract that is taken after you swallow a chalky liquid that shows up clearly on the X-ray.  Endoscopy. This is a procedure to look into your stomach using a thin, flexible tube that has a tiny camera and light on the end of it. How is this treated? This condition may be treated by:  Dietary and lifestyle changes to help reduce GERD symptoms.  Medicines. These may include: ? Over-the-counter antacids. ? Medicines that make your stomach empty more quickly. ? Medicines that block the production of stomach acid (H2 blockers). ? Stronger medicines to reduce stomach acid (proton pump inhibitors).  Surgery to repair the hernia, if other treatments are not helping. If you have no symptoms, you may not need treatment. Follow these instructions at home: Lifestyle and activity  Do not use any products that contain nicotine or tobacco, such as cigarettes and e-cigarettes. If you need help quitting, ask your health care provider.  Try to achieve and maintain a healthy body weight.  Avoid putting pressure on your abdomen. Anything that puts pressure on your abdomen increases the amount of acid that may be pushed up into your esophagus. ? Avoid bending over, especially after eating. ? Raise the head of your bed by putting blocks under the legs. This keeps your head and esophagus higher than your stomach. ? Do not wear tight clothing around your chest or stomach. ? Try not to strain when having a bowel movement, when urinating, or when lifting heavy objects. Eating and drinking  Avoid foods that can worsen GERD symptoms. These may include: ? Fatty foods, like fried foods. ? Citrus fruits, like oranges or lemon. ? Other foods and drinks that contain acid, like orange juice or tomatoes. ? Spicy food. ? Chocolate.  Eat frequent small meals instead of three large meals a day. This helps prevent your stomach from getting too full. ? Eat  slowly. ? Do not lie down right after eating. ? Do not eat 1-2 hours before bed.  Do not drink beverages with caffeine. These include cola, coffee, cocoa, and tea.  Do not drink alcohol. General instructions  Take over-the-counter and prescription medicines only as told by your health care provider.  Keep all follow-up visits as told by your health care provider. This is important. Contact a health care provider if:  Your symptoms are not controlled with medicines or lifestyle changes.  You are having trouble swallowing.  You have coughing or wheezing that will not go away. Get help right away if:  Your pain is getting worse.  Your pain spreads to your arms, neck, jaw, teeth, or back.  You have shortness of breath.  You sweat for no reason.  You feel sick to your stomach (nauseous) or you vomit.  You vomit blood.  You have bright red blood in your stools.  You have black, tarry stools. This information is not intended to replace advice given to you by your health care provider. Make sure you discuss any questions you have with your health care provider. Document Revised: 04/21/2017 Document Reviewed: 12/12/2016 Elsevier Patient Education  2020 [employer]   Soft-Food Eating Plan A soft-food eating plan includes foods that are safe and easy to chew and swallow. Your health care provider or dietitian can help you find foods and flavors that fit into this plan. Follow this plan until your health care provider or dietitian says it is safe to start eating other foods and food textures. What are tips for following this plan? General guidelines   Take small bites of food, or cut food into pieces about  inch or smaller. Bite-sized pieces of food are easier to chew and swallow.  Eat moist foods. Avoid overly dry foods.  Avoid foods that: ? Are difficult to swallow, such as dry, chunky, crispy, or sticky foods. ? Are difficult to chew, such as hard, tough, or stringy  foods. ? Contain nuts, seeds, or fruits.  Follow instructions from your dietitian about the types of liquids that are safe for you to swallow. You may be allowed to have: ? Thick liquids only. This includes only liquids that are thicker than honey. ? Thin and thick liquids. This includes all beverages and foods that become liquid at room temperature.  To make thick liquids: ? Purchase a commercial liquid thickening powder. These are available at grocery stores and pharmacies. ? Mix the thickener into liquids according to instructions on the label. ? Purchase ready-made thickened liquids. ? Thicken soup by pureeing, straining to remove chunks, and adding flour, potato flakes, or corn starch. ? Add commercial thickener to foods that become liquid at room temperature, such as milk shakes, yogurt, ice cream, gelatin, and sherbet.  Ask your health care provider whether you need to take a fiber supplement. Cooking  Cook meats so they stay tender and moist. Use methods like braising, stewing, or baking in liquid.  Cook vegetables and fruit until they are soft enough to be mashed with a fork.  Peel soft, fresh fruits such as peaches, nectarines, and melons.  When making soup, make sure chunks of meat and vegetables are smaller than  inch.  Reheat leftover foods slowly so that a tough crust does not form. What foods are allowed? The items listed below may not be a complete list. Talk with your dietitian about what dietary choices are best for you. Grains Breads, muffins, pancakes, or waffles moistened with syrup, jelly, or butter. Dry cereals well-moistened with milk. Moist, cooked cereals. Well-cooked pasta and rice. Vegetables All soft-cooked vegetables. Shredded lettuce. Fruits All canned and cooked fruits. Soft, peeled fresh fruits. Strawberries. Dairy Milk. Cream. Yogurt. Cottage cheese. Soft cheese without the rind. Meats and other protein foods Tender, moist ground meat, poultry,  or fish. Meat cooked in gravy or sauces. Eggs. Sweets and desserts Ice cream. Milk shakes. Sherbet. Pudding. Fats and oils Butter. Margarine. Olive, canola, sunflower, and grapeseed oil. Smooth salad dressing. Smooth cream cheese. Mayonnaise. Gravy. What foods are not allowed? The items listed bemay not be a complete list. Talk with your dietitian about what dietary choices are best for you. Grains Coarse or dry cereals, such as bran, granola, and shredded wheat. Tough or chewy crusty breads, such as [place] bread or baguettes. Breads with nuts, seeds, or fruit. Vegetables All raw vegetables. Cooked corn. Cooked vegetables that are tough or stringy. Tough, crisp, fried potatoes and potato skins. Fruits Fresh fruits with skins or seeds, or both, such as apples, pears, and grapes. Stringy, high-pulp fruits, such as papaya, pineapple, coconut, and mango. Fruit leather and all dried fruit. Dairy Yogurt with nuts or coconut.  Meats and other protein foods Hard, dry sausages. Dry meat, poultry, or fish. Meats with gristle. Fish with bones. Fried meat or fish. Lunch meat and hotdogs. Nuts and seeds. Chunky peanut butter or other nut butters. Sweets and desserts Cakes or cookies that are very dry or chewy. Desserts with dried fruit, nuts, or coconut. Fried pastries. Very rich pastries. Fats and oils Cream cheese with fruit or nuts. Salad dressings with seeds or chunks. Summary  A soft-food eating plan includes foods that are safe and easy to swallow. Generally, the foods should be soft enough to be mashed with a fork.  Avoid foods that are dry, hard to chew, crunchy, sticky, stringy, or crispy.  Ask your health care provider whether you need to thicken your liquids and if you need to take a fiber supplement. This information is not intended to replace advice given to you by your health care provider. Make sure you discuss any questions you have with your health care provider. Document Revised:  08/30/2018 Document Reviewed: 07/12/2016 Elsevier Patient Education  [facility].

## 2020-05-14 NOTE — Op Note (Signed)
[facility] Patient Name: Valerie Griffin Procedure Date: 05/14/2020 12:03 PM MRN: [id number] Date of Birth: 24-Jan-1934 Attending MD: Lionel December , MD CSN: [id number] Age: 86 Admit Type: Outpatient Procedure:                Upper GI endoscopy Indications:              Esophageal dysphagia, Stricture of the esophagus,                            Follow-up of esophageal stricture, For therapy of                            esophageal stricture Providers:                Lionel December, MD, Loma Messing B. Patsy Lager, RN, Durwin Glaze Tech, Technician Referring MD:             Ignatius Specking, MD Medicines:                Lidocaine spray, Meperidine 50 mg IV, Midazolam 5                            mg IV Complications:            No immediate complications. Estimated Blood Loss:     Estimated blood loss was minimal. Procedure:                Pre-Anesthesia Assessment:                           - Prior to the procedure, a History and Physical                            was performed, and patient medications and                            allergies were reviewed. The patient's tolerance of                            previous anesthesia was also reviewed. The risks                            and benefits of the procedure and the sedation                            options and risks were discussed with the patient.                            All questions were answered, and informed consent                            was obtained. Prior Anticoagulants: The patient has  taken no previous anticoagulant or antiplatelet                            agents except for aspirin. ASA Grade Assessment: II                            - A patient with mild systemic disease. After                            reviewing the risks and benefits, the patient was                            deemed in satisfactory condition to undergo the                             procedure.                           After obtaining informed consent, the endoscope was                            passed under direct vision. Throughout the                            procedure, the patient's blood pressure, pulse, and                            oxygen saturations were monitored continuously. The                            [id number]) was introduced through the mouth,                            and advanced to the second part of duodenum. The                            upper GI endoscopy was accomplished without                            difficulty. The patient tolerated the procedure                            well. Scope In: 12:23:05 PM Scope Out: 12:33:41 PM Total Procedure Duration: 0 hours 10 minutes 36 seconds  Findings:      The hypopharynx was normal.      The proximal esophagus was normal.      LA Grade C (one or more mucosal breaks continuous between tops of 2 or       more mucosal folds, less than 75% circumference) esophagitis with no       bleeding was found 25 to 30 cm from the incisors.      One severe stenosis was found 30 cm from the incisors. This stenosis       measured less than one cm (in length). The stenosis was traversed after  dilation. A TTS dilator was passed through the scope. Dilation with a       12-13.5-15 mm balloon dilator was performed to 12 mm, 13.5 mm and 15 mm.       The dilation site was examined and showed moderate mucosal disruption,       moderate improvement in luminal narrowing and no perforation.      A 9 cm hiatal hernia was present.      The entire examined stomach was normal.      The duodenal bulb and second portion of the duodenum were normal. Impression:               - Normal hypopharynx.                           - Normal proximal esophagus.                           - LA Grade C reflux esophagitis with no bleeding.                           - Esophageal stenosis. Dilated.                           - 9  cm hiatal hernia.                           - Normal stomach.                           - Normal duodenal bulb and second portion of the                            duodenum.                           - No specimens collected. Moderate Sedation:      Moderate (conscious) sedation was administered by the endoscopy nurse       and supervised by the endoscopist. The following parameters were       monitored: oxygen saturation, heart rate, blood pressure, CO2       capnography and response to care. Total physician intraservice time was       17 minutes. Recommendation:           - Patient has a contact number available for                            emergencies. The signs and symptoms of potential                            delayed complications were discussed with the                            patient. Return to normal activities tomorrow.                            Written discharge instructions were provided to the  patient.                           - Mechanical soft diet.                           - Continue present medications.                           - No aspirin, ibuprofen, naproxen, or other                            non-steroidal anti-inflammatory drugs for 3 days.                           - Repeat upper endoscopy in 4 weeks. Procedure Code(s):        --- Professional ---                           [phone], Esophagogastroduodenoscopy, flexible,                            transoral; with transendoscopic balloon dilation of                            esophagus (less than 30 mm diameter)                           G0500, Moderate sedation services provided by the                            same physician or other qualified health care                            professional performing a gastrointestinal                            endoscopic service that sedation supports,                            requiring the presence of an independent trained                             observer to assist in the monitoring of the                            patient's level of consciousness and physiological                            status; initial 15 minutes of intra-service time;                            patient age 61 years or older (additional time may                            be reported with  [id number], as appropriate) Diagnosis Code(s):        --- Professional ---                           K21.00, Gastro-esophageal reflux disease with                            esophagitis, without bleeding                           K22.2, Esophageal obstruction                           K44.9, Diaphragmatic hernia without obstruction or                            gangrene                           R13.14, Dysphagia, pharyngoesophageal phase CPT copyright 2019 [employer]. All rights reserved. The codes documented in this report are preliminary and upon coder review may  be revised to meet current compliance requirements. Lionel December, MD Lionel December, MD 05/14/2020 12:45:22 PM This report has been signed electronically. Number of Addenda: 0

## 2020-05-14 NOTE — H&P (Signed)
Valerie Griffin is an 86 y.o. female.   Chief Complaint: Patient is here for esophagogastroduodenoscopy with esophageal dilation HPI: Patient is 86 year old Caucasian female with several year history of GERD complicated by Barrett's esophagus and stricture which is been dilated multiple times.  She was seen in the office recently for progressive dysphagia.  She is only able to get full liquids down.  However she is not losing weight.  She is therefore returning for reevaluation and esophageal dilation.  On her last exam the stricture was only dilated to 15 mm.  She denies hematemesis melena or rectal bleeding  Past Medical History:  Diagnosis Date  . Anemia   . Arthritis   . Constipation   . Dysphagia 08/16/2016  . GERD (gastroesophageal reflux disease)     Past Surgical History:  Procedure Laterality Date  . ABDOMINAL HYSTERECTOMY    . APPENDECTOMY    . broken arm and wrist with plate and scews rt wrist Right   . CHOLECYSTECTOMY    . COLONOSCOPY    . complete hysterectomy'     fibroid tumors bleeding  . ESOPHAGEAL DILATION N/A 08/22/2016   Procedure: ESOPHAGEAL DILATION;  Surgeon: Rogene Houston, MD;  [facility];  Service: Endoscopy;  Laterality: N/A;  . ESOPHAGEAL DILATION N/A 09/14/2016   Procedure: ESOPHAGEAL DILATION;  Surgeon: Rogene Houston, MD;  [facility];  Service: Endoscopy;  Laterality: N/A;  . ESOPHAGEAL DILATION N/A 07/26/2017   Procedure: ESOPHAGEAL DILATION;  Surgeon: Rogene Houston, MD;  [facility];  Service: Endoscopy;  Laterality: N/A;  . ESOPHAGEAL DILATION N/A 08/31/2017   Procedure: ESOPHAGEAL DILATION;  Surgeon: Rogene Houston, MD;  [facility];  Service: Endoscopy;  Laterality: N/A;  . ESOPHAGEAL DILATION N/A 10/26/2017   Procedure: ESOPHAGEAL DILATION;  Surgeon: Rogene Houston, MD;  [facility];  Service: Endoscopy;  Laterality: N/A;  . ESOPHAGEAL DILATION N/A 02/07/2018   Procedure: ESOPHAGEAL  DILATION;  Surgeon: Rogene Houston, MD;  [facility];  Service: Endoscopy;  Laterality: N/A;  . ESOPHAGEAL DILATION N/A 05/31/2019   Procedure: ESOPHAGEAL DILATION;  Surgeon: Rogene Houston, MD;  [facility];  Service: Endoscopy;  Laterality: N/A;  . ESOPHAGEAL DILATION N/A 06/14/2019   Procedure: ESOPHAGEAL DILATION;  Surgeon: Rogene Houston, MD;  [facility];  Service: Endoscopy;  Laterality: N/A;  . ESOPHAGEAL DILATION N/A 07/04/2019   Procedure: ESOPHAGEAL DILATION;  Surgeon: Rogene Houston, MD;  [facility];  Service: Endoscopy;  Laterality: N/A;  730  . ESOPHAGEAL DILATION N/A 08/01/2019   Procedure: ESOPHAGEAL DILATION;  Surgeon: Rogene Houston, MD;  [facility];  Service: Endoscopy;  Laterality: N/A;  . ESOPHAGOGASTRODUODENOSCOPY N/A 08/22/2016   Procedure: ESOPHAGOGASTRODUODENOSCOPY (EGD);  Surgeon: Rogene Houston, MD;  [facility];  Service: Endoscopy;  Laterality: N/A;  . ESOPHAGOGASTRODUODENOSCOPY N/A 09/14/2016   Procedure: ESOPHAGOGASTRODUODENOSCOPY (EGD);  Surgeon: Rogene Houston, MD;  [facility];  Service: Endoscopy;  Laterality: N/A;  1225  . ESOPHAGOGASTRODUODENOSCOPY N/A 07/26/2017   Procedure: ESOPHAGOGASTRODUODENOSCOPY (EGD);  Surgeon: Rogene Houston, MD;  [facility];  Service: Endoscopy;  Laterality: N/A;  1:45  . ESOPHAGOGASTRODUODENOSCOPY N/A 08/31/2017   Procedure: ESOPHAGOGASTRODUODENOSCOPY (EGD);  Surgeon: Rogene Houston, MD;  [facility];  Service: Endoscopy;  Laterality: N/A;  . ESOPHAGOGASTRODUODENOSCOPY N/A 10/26/2017   Procedure: ESOPHAGOGASTRODUODENOSCOPY (EGD);  Surgeon: Rogene Houston, MD;  [facility];  Service: Endoscopy;  Laterality: N/A;  1115  . ESOPHAGOGASTRODUODENOSCOPY N/A 02/07/2018   Procedure: ESOPHAGOGASTRODUODENOSCOPY (EGD);  Surgeon: Rogene Houston, MD;  [facility];  Service: Endoscopy;  Laterality: N/A;  830   . ESOPHAGOGASTRODUODENOSCOPY N/A 07/04/2019   Procedure: ESOPHAGOGASTRODUODENOSCOPY (EGD);  Surgeon: Rogene Houston, MD;  [facility];  Service: Endoscopy;  Laterality: N/A;  730  . ESOPHAGOGASTRODUODENOSCOPY N/A 08/01/2019   Procedure: ESOPHAGOGASTRODUODENOSCOPY (EGD);  Surgeon: Rogene Houston, MD;  [facility];  Service: Endoscopy;  Laterality: N/A;  125  . ESOPHAGOGASTRODUODENOSCOPY (EGD) WITH PROPOFOL N/A 05/31/2019   Procedure: ESOPHAGOGASTRODUODENOSCOPY (EGD) WITH PROPOFOL;  Surgeon: Rogene Houston, MD;  [facility];  Service: Endoscopy;  Laterality: N/A;  2:35  . ESOPHAGOGASTRODUODENOSCOPY (EGD) WITH PROPOFOL N/A 06/14/2019   Procedure: ESOPHAGOGASTRODUODENOSCOPY (EGD) WITH PROPOFOL;  Surgeon: Rogene Houston, MD;  [facility];  Service: Endoscopy;  Laterality: N/A;  . FOREIGN BODY REMOVAL  05/31/2019   Procedure: FOREIGN BODY REMOVAL;  Surgeon: Rogene Houston, MD;  [facility];  Service: Endoscopy;;    Family History  Problem Relation Age of Onset  . Heart Problems Mother   . Alzheimer's disease Mother   . Hypertension Mother   . Prostate cancer Father   . Heart attack Father   . Heart Problems Sister    Social History:  reports that she has never smoked. She has never used smokeless tobacco. She reports that she does not drink alcohol and does not use drugs.  Allergies:  Allergies  Allergen Reactions  . Penicillins Rash and Other (See Comments)    40 years ago, caused rash and tachycardia Has patient had a PCN reaction causing immediate rash, facial/tongue/throat swelling, SOB or lightheadedness with hypotension: No Has patient had a PCN reaction causing severe rash involving mucus membranes or skin necrosis: No Has patient had a PCN reaction that required hospitalization: No Has patient had a PCN reaction occurring within the last 10 years: No If all of the above answers are "NO", then may proceed with  Cephalosporin use.     Medications Prior to Admission  Medication Sig Dispense Refill  . Cholecalciferol (VITAMIN D) 50 MCG (2000 UT) tablet Take 2,000 Units by mouth every evening.     Marland Kitchen esomeprazole (NEXIUM) 20 MG capsule Take 1 capsule (20 mg total) by mouth 2 (two) times daily before a meal.    . Ferrous Sulfate (IRON) 28 MG TABS Take 28 mg by mouth in the morning and at bedtime.     Marland Kitchen HYDROcodone-acetaminophen (NORCO/VICODIN) 5-325 MG tablet Take 1 tablet by mouth every 6 (six) hours as needed for moderate pain.    . Multiple Vitamins-Minerals (CENTRUM SILVER PO) Take 1 tablet by mouth daily.    . TURMERIC CURCUMIN PO Take 1,000 mg by mouth daily.    Marland Kitchen aspirin EC 81 MG tablet Take 1 tablet (81 mg total) by mouth every evening.    . docusate sodium (COLACE) 100 MG capsule Take 100 mg by mouth at bedtime.    . Wheat Dextrin (BENEFIBER DRINK MIX) PACK Take 4 g by mouth at bedtime. (Patient not taking: No sig reported)      No results found for this or any previous visit (from the past 48 hour(s)). No results found.  Review of Systems  Blood pressure (!) 169/63, pulse 76, temperature 98.7 F (37.1 C), temperature source Oral, resp. rate 15, height 5' 5.5" (1.664  m), weight 87.5 kg, SpO2 99 %. Physical Exam HENT:     Mouth/Throat:     Mouth: Mucous membranes are moist.     Pharynx: Oropharynx is clear.     Comments: Patient has upper and lower dentures. Eyes:     General: No scleral icterus.    Conjunctiva/sclera: Conjunctivae normal.  Cardiovascular:     Rate and Rhythm: Normal rate and regular rhythm.     Heart sounds: Normal heart sounds. No murmur heard.   Pulmonary:     Effort: Pulmonary effort is normal.     Breath sounds: Normal breath sounds.  Abdominal:     General: There is no distension.     Palpations: Abdomen is soft. There is no mass.     Tenderness: There is no abdominal tenderness.  Musculoskeletal:     Cervical back: Neck supple.  Lymphadenopathy:      Cervical: No cervical adenopathy.  Skin:    General: Skin is warm and dry.  Neurological:     Mental Status: She is alert.      Assessment/Plan  Esophageal dysphagia secondary to esophageal stricture Esophagogastroduodenoscopy with esophageal dilation.  Hildred Laser, MD 05/14/2020, 12:11 PM

## 2020-05-14 NOTE — Telephone Encounter (Signed)
Per op  Note patient needs Repeat upper endoscopy in 4 weeks

## 2020-05-21 ENCOUNTER — Encounter (HOSPITAL_COMMUNITY): Payer: Self-pay | Admitting: Internal Medicine

## 2020-05-26 ENCOUNTER — Other Ambulatory Visit (INDEPENDENT_AMBULATORY_CARE_PROVIDER_SITE_OTHER): Payer: Self-pay

## 2020-05-26 DIAGNOSIS — R1319 Other dysphagia: Secondary | ICD-10-CM

## 2020-05-26 DIAGNOSIS — K222 Esophageal obstruction: Secondary | ICD-10-CM

## 2020-05-27 ENCOUNTER — Encounter (INDEPENDENT_AMBULATORY_CARE_PROVIDER_SITE_OTHER): Payer: Self-pay

## 2020-05-27 ENCOUNTER — Other Ambulatory Visit (INDEPENDENT_AMBULATORY_CARE_PROVIDER_SITE_OTHER): Payer: Self-pay

## 2020-06-09 ENCOUNTER — Other Ambulatory Visit: Payer: Self-pay

## 2020-06-09 ENCOUNTER — Other Ambulatory Visit (HOSPITAL_COMMUNITY)
Admission: RE | Admit: 2020-06-09 | Discharge: 2020-06-09 | Disposition: A | Discharge status: Home / Self Care | Payer: Medicare Other | Source: Ambulatory Visit | Attending: Internal Medicine | Admitting: Internal Medicine

## 2020-06-09 DIAGNOSIS — Z20822 Contact with and (suspected) exposure to covid-19: Secondary | ICD-10-CM | POA: Insufficient documentation

## 2020-06-09 DIAGNOSIS — Z01812 Encounter for preprocedural laboratory examination: Secondary | ICD-10-CM | POA: Diagnosis present

## 2020-06-09 LAB — SARS CORONAVIRUS 2 (TAT 6-24 HRS): SARS Coronavirus 2: NEGATIVE

## 2020-06-10 ENCOUNTER — Other Ambulatory Visit (INDEPENDENT_AMBULATORY_CARE_PROVIDER_SITE_OTHER): Payer: Self-pay

## 2020-06-11 ENCOUNTER — Encounter (HOSPITAL_COMMUNITY)
Admission: RE | Disposition: A | Discharge status: Home / Self Care | Payer: Self-pay | Source: Home / Self Care | Attending: Internal Medicine

## 2020-06-11 ENCOUNTER — Encounter (HOSPITAL_COMMUNITY): Payer: Self-pay | Admitting: Internal Medicine

## 2020-06-11 ENCOUNTER — Ambulatory Visit (HOSPITAL_COMMUNITY)
Admission: RE | Admit: 2020-06-11 | Discharge: 2020-06-11 | Disposition: A | Discharge status: Home / Self Care | Payer: Medicare Other | Attending: Internal Medicine | Admitting: Internal Medicine

## 2020-06-11 ENCOUNTER — Other Ambulatory Visit: Payer: Self-pay

## 2020-06-11 DIAGNOSIS — Z7982 Long term (current) use of aspirin: Secondary | ICD-10-CM | POA: Insufficient documentation

## 2020-06-11 DIAGNOSIS — K2100 Gastro-esophageal reflux disease with esophagitis, without bleeding: Secondary | ICD-10-CM | POA: Diagnosis not present

## 2020-06-11 DIAGNOSIS — K449 Diaphragmatic hernia without obstruction or gangrene: Secondary | ICD-10-CM

## 2020-06-11 DIAGNOSIS — Z79899 Other long term (current) drug therapy: Secondary | ICD-10-CM | POA: Insufficient documentation

## 2020-06-11 DIAGNOSIS — Z880 Allergy status to penicillin: Secondary | ICD-10-CM | POA: Diagnosis not present

## 2020-06-11 DIAGNOSIS — K222 Esophageal obstruction: Secondary | ICD-10-CM

## 2020-06-11 DIAGNOSIS — R1314 Dysphagia, pharyngoesophageal phase: Secondary | ICD-10-CM | POA: Insufficient documentation

## 2020-06-11 DIAGNOSIS — R1319 Other dysphagia: Secondary | ICD-10-CM | POA: Insufficient documentation

## 2020-06-11 HISTORY — PX: ESOPHAGOGASTRODUODENOSCOPY: SHX5428

## 2020-06-11 HISTORY — PX: BALLOON DILATION: SHX5330

## 2020-06-11 SURGERY — EGD (ESOPHAGOGASTRODUODENOSCOPY)
Anesthesia: Moderate Sedation

## 2020-06-11 MED ORDER — LIDOCAINE VISCOUS HCL 2 % MT SOLN
OROMUCOSAL | Status: AC
  Filled 2020-06-11: qty 15

## 2020-06-11 MED ORDER — SODIUM CHLORIDE 0.9 % IV SOLN
INTRAVENOUS | Status: DC
  Administered 2020-06-11: 1000 mL via INTRAVENOUS

## 2020-06-11 MED ORDER — MIDAZOLAM HCL 5 MG/5ML IJ SOLN
INTRAMUSCULAR | Status: DC | PRN
  Administered 2020-06-11 (×2): 2 mg via INTRAVENOUS
  Administered 2020-06-11: 1 mg via INTRAVENOUS

## 2020-06-11 MED ORDER — MEPERIDINE HCL 50 MG/ML IJ SOLN
INTRAMUSCULAR | Status: DC | PRN
  Administered 2020-06-11 (×2): 25 mg via INTRAVENOUS

## 2020-06-11 MED ORDER — MIDAZOLAM HCL 5 MG/5ML IJ SOLN
INTRAMUSCULAR | Status: AC
  Filled 2020-06-11: qty 10

## 2020-06-11 MED ORDER — STERILE WATER FOR IRRIGATION IR SOLN
Status: DC | PRN
  Administered 2020-06-11: 1.5 mL

## 2020-06-11 MED ORDER — MEPERIDINE HCL 50 MG/ML IJ SOLN
INTRAMUSCULAR | Status: AC
  Filled 2020-06-11: qty 1

## 2020-06-11 NOTE — H&P (Signed)
Valerie Griffin is an 87 y.o. female.   Chief Complaint: Patient is here for esophagogastroduodenoscopy with esophageal dilation HPI: Patient is 87 year old Caucasian female who has chronic GERD complicated by Barrett's esophagus and refractory esophagitis and a stricture requiring multiple dilations.  Her last dilation was on 05/14/2020.  Stricture was dilated to 15 mm with the balloon dilator.  Patient states she was able to swallow much better since then.  She is fine to watch her food intake.  She says she has lost few pounds.  Last year she had gained 15 pounds.  She was seen by oncologist at [facility].  She says she received iron infusion.  Her hemoglobin 30 2021 was 10.6.  She denies nausea vomiting abdominal pain melena or rectal bleeding. She is on low-dose aspirin which is on hold.  Past Medical History:  Diagnosis Date  . Anemia   . Arthritis   . Constipation   . Dysphagia 08/16/2016  . GERD (gastroesophageal reflux disease)     Past Surgical History:  Procedure Laterality Date  . ABDOMINAL HYSTERECTOMY    . APPENDECTOMY    . broken arm and wrist with plate and scews rt wrist Right   . CHOLECYSTECTOMY    . COLONOSCOPY    . complete hysterectomy'     fibroid tumors bleeding  . ESOPHAGEAL DILATION N/A 08/22/2016   Procedure: ESOPHAGEAL DILATION;  Surgeon: Malissa Hippo, MD;  [facility];  Service: Endoscopy;  Laterality: N/A;  . ESOPHAGEAL DILATION N/A 09/14/2016   Procedure: ESOPHAGEAL DILATION;  Surgeon: Malissa Hippo, MD;  [facility];  Service: Endoscopy;  Laterality: N/A;  . ESOPHAGEAL DILATION N/A 07/26/2017   Procedure: ESOPHAGEAL DILATION;  Surgeon: Malissa Hippo, MD;  [facility];  Service: Endoscopy;  Laterality: N/A;  . ESOPHAGEAL DILATION N/A 08/31/2017   Procedure: ESOPHAGEAL DILATION;  Surgeon: Malissa Hippo, MD;  [facility];  Service: Endoscopy;  Laterality: N/A;  . ESOPHAGEAL DILATION N/A 10/26/2017   Procedure:  ESOPHAGEAL DILATION;  Surgeon: Malissa Hippo, MD;  [facility];  Service: Endoscopy;  Laterality: N/A;  . ESOPHAGEAL DILATION N/A 02/07/2018   Procedure: ESOPHAGEAL DILATION;  Surgeon: Malissa Hippo, MD;  [facility];  Service: Endoscopy;  Laterality: N/A;  . ESOPHAGEAL DILATION N/A 05/31/2019   Procedure: ESOPHAGEAL DILATION;  Surgeon: Malissa Hippo, MD;  [facility];  Service: Endoscopy;  Laterality: N/A;  . ESOPHAGEAL DILATION N/A 06/14/2019   Procedure: ESOPHAGEAL DILATION;  Surgeon: Malissa Hippo, MD;  [facility];  Service: Endoscopy;  Laterality: N/A;  . ESOPHAGEAL DILATION N/A 07/04/2019   Procedure: ESOPHAGEAL DILATION;  Surgeon: Malissa Hippo, MD;  [facility];  Service: Endoscopy;  Laterality: N/A;  730  . ESOPHAGEAL DILATION N/A 08/01/2019   Procedure: ESOPHAGEAL DILATION;  Surgeon: Malissa Hippo, MD;  [facility];  Service: Endoscopy;  Laterality: N/A;  . ESOPHAGEAL DILATION N/A 05/14/2020   Procedure: ESOPHAGEAL DILATION;  Surgeon: Malissa Hippo, MD;  [facility];  Service: Endoscopy;  Laterality: N/A;  . ESOPHAGOGASTRODUODENOSCOPY N/A 08/22/2016   Procedure: ESOPHAGOGASTRODUODENOSCOPY (EGD);  Surgeon: Malissa Hippo, MD;  [facility];  Service: Endoscopy;  Laterality: N/A;  . ESOPHAGOGASTRODUODENOSCOPY N/A 09/14/2016   Procedure: ESOPHAGOGASTRODUODENOSCOPY (EGD);  Surgeon: Malissa Hippo, MD;  [facility];  Service: Endoscopy;  Laterality: N/A;  1225  . ESOPHAGOGASTRODUODENOSCOPY N/A 07/26/2017  Procedure: ESOPHAGOGASTRODUODENOSCOPY (EGD);  Surgeon: Rogene Houston, MD;  [facility];  Service: Endoscopy;  Laterality: N/A;  1:45  . ESOPHAGOGASTRODUODENOSCOPY N/A 08/31/2017   Procedure: ESOPHAGOGASTRODUODENOSCOPY (EGD);  Surgeon: Rogene Houston, MD;  [facility];  Service: Endoscopy;  Laterality: N/A;  . ESOPHAGOGASTRODUODENOSCOPY N/A  10/26/2017   Procedure: ESOPHAGOGASTRODUODENOSCOPY (EGD);  Surgeon: Rogene Houston, MD;  [facility];  Service: Endoscopy;  Laterality: N/A;  1115  . ESOPHAGOGASTRODUODENOSCOPY N/A 02/07/2018   Procedure: ESOPHAGOGASTRODUODENOSCOPY (EGD);  Surgeon: Rogene Houston, MD;  [facility];  Service: Endoscopy;  Laterality: N/A;  830  . ESOPHAGOGASTRODUODENOSCOPY N/A 07/04/2019   Procedure: ESOPHAGOGASTRODUODENOSCOPY (EGD);  Surgeon: Rogene Houston, MD;  [facility];  Service: Endoscopy;  Laterality: N/A;  730  . ESOPHAGOGASTRODUODENOSCOPY N/A 08/01/2019   Procedure: ESOPHAGOGASTRODUODENOSCOPY (EGD);  Surgeon: Rogene Houston, MD;  [facility];  Service: Endoscopy;  Laterality: N/A;  125  . ESOPHAGOGASTRODUODENOSCOPY N/A 05/14/2020   Procedure: ESOPHAGOGASTRODUODENOSCOPY (EGD);  Surgeon: Rogene Houston, MD;  [facility];  Service: Endoscopy;  Laterality: N/A;  10:30  . ESOPHAGOGASTRODUODENOSCOPY (EGD) WITH PROPOFOL N/A 05/31/2019   Procedure: ESOPHAGOGASTRODUODENOSCOPY (EGD) WITH PROPOFOL;  Surgeon: Rogene Houston, MD;  [facility];  Service: Endoscopy;  Laterality: N/A;  2:35  . ESOPHAGOGASTRODUODENOSCOPY (EGD) WITH PROPOFOL N/A 06/14/2019   Procedure: ESOPHAGOGASTRODUODENOSCOPY (EGD) WITH PROPOFOL;  Surgeon: Rogene Houston, MD;  [facility];  Service: Endoscopy;  Laterality: N/A;  . FOREIGN BODY REMOVAL  05/31/2019   Procedure: FOREIGN BODY REMOVAL;  Surgeon: Rogene Houston, MD;  [facility];  Service: Endoscopy;;    Family History  Problem Relation Age of Onset  . Heart Problems Mother   . Alzheimer's disease Mother   . Hypertension Mother   . Prostate cancer Father   . Heart attack Father   . Heart Problems Sister    Social History:  reports that she has never smoked. She has never used smokeless tobacco. She reports that she does not drink alcohol and does not use drugs.  Allergies:   Allergies  Allergen Reactions  . Penicillins Rash and Other (See Comments)    40 years ago, caused rash and tachycardia Has patient had a PCN reaction causing immediate rash, facial/tongue/throat swelling, SOB or lightheadedness with hypotension: No Has patient had a PCN reaction causing severe rash involving mucus membranes or skin necrosis: No Has patient had a PCN reaction that required hospitalization: No Has patient had a PCN reaction occurring within the last 10 years: No If all of the above answers are "NO", then may proceed with Cephalosporin use.     Medications Prior to Admission  Medication Sig Dispense Refill  . aspirin EC 81 MG tablet Take 1 tablet (81 mg total) by mouth every evening. (Patient taking differently: Take 81 mg by mouth daily.) 30 tablet 11  . Cholecalciferol (VITAMIN D) 50 MCG (2000 UT) tablet Take 2,000 Units by mouth daily.    Marland Kitchen docusate sodium (COLACE) 100 MG capsule Take 100 mg by mouth at bedtime.    Marland Kitchen esomeprazole (NEXIUM) 20 MG capsule Take 1 capsule (20 mg total) by mouth 2 (two) times daily before a meal. (Patient taking differently: Take 20 mg by mouth daily before breakfast.)    . Ferrous Sulfate (IRON) 28 MG TABS Take 28 mg by mouth in the morning and at bedtime.     Marland Kitchen HYDROcodone-acetaminophen (NORCO/VICODIN) 5-325 MG  tablet Take 1 tablet by mouth every 6 (six) hours as needed for moderate pain.    Marland Kitchen ibuprofen (ADVIL) 200 MG tablet Take 200 mg by mouth every 8 (eight) hours as needed (for pain.).    Marland Kitchen Multiple Vitamin (MULTIVITAMIN WITH MINERALS) TABS tablet Take 1 tablet by mouth daily. Centrum Silver    . TURMERIC PO Take 1,000 mg by mouth daily.    . Wheat Dextrin (BENEFIBER DRINK MIX) PACK Take 4 g by mouth at bedtime.      Results for orders placed or performed during the hospital encounter of 06/09/20 (from the past 48 hour(s))  SARS CORONAVIRUS 2 (TAT 6-24 HRS) Nasopharyngeal Nasopharyngeal Swab     Status: None   Collection Time: 06/09/20   8:02 AM   Specimen: Nasopharyngeal Swab  Result Value Ref Range   SARS Coronavirus 2 NEGATIVE NEGATIVE    Comment: (NOTE) SARS-CoV-2 target nucleic acids are NOT DETECTED.  The SARS-CoV-2 RNA is generally detectable in upper and lower respiratory specimens during the acute phase of infection. Negative results do not preclude SARS-CoV-2 infection, do not rule out co-infections with other pathogens, and should not be used as the sole basis for treatment or other patient management decisions. Negative results must be combined with clinical observations, patient history, and epidemiological information. The expected result is Negative.  Fact Sheet for Patients: SugarRoll.be  Fact Sheet for Healthcare Providers: https://www.woods-mathews.com/  This test is not yet approved or cleared by the [place] FDA and  has been authorized for detection and/or diagnosis of SARS-CoV-2 by FDA under an Emergency Use Authorization (EUA). This EUA will remain  in effect (meaning this test can be used) for the duration of the COVID-19 declaration under Se ction 564(b)(1) of the Act, 21 U.S.C. section 360bbb-3(b)(1), unless the authorization is terminated or revoked sooner.  Performed at [facility], [place] [address] [place], [place] [address]    No results found.  Review of Systems  Blood pressure (!) 146/59, pulse 89, temperature 98.4 F (36.9 C), temperature source Oral, resp. rate 16, SpO2 99 %. Physical Exam HENT:     Mouth/Throat:     Mouth: Mucous membranes are moist.     Pharynx: Oropharynx is clear.  Eyes:     General: No scleral icterus.    Conjunctiva/sclera: Conjunctivae normal.  Cardiovascular:     Rate and Rhythm: Normal rate and regular rhythm.     Heart sounds: Normal heart sounds. No murmur heard.   Pulmonary:     Effort: Pulmonary effort is normal.     Breath sounds: Normal breath sounds.  Abdominal:     General:  There is no distension.     Palpations: Abdomen is soft. There is no mass.     Tenderness: There is no abdominal tenderness.  Musculoskeletal:        General: No swelling.     Cervical back: Neck supple.  Lymphadenopathy:     Cervical: No cervical adenopathy.  Skin:    General: Skin is warm and dry.  Neurological:     Mental Status: She is alert.      Assessment/Plan  Esophageal stricture. Esophagogastroduodenoscopy with esophageal dilation.  Hildred Laser, MD 06/11/2020, 7:31 AM

## 2020-06-11 NOTE — Discharge Instructions (Signed)
Upper Endoscopy, Adult, Care After This sheet gives you information about how to care for yourself after your procedure. Your health care provider may also give you more specific instructions. If you have problems or questions, contact your health care provider. What can I expect after the procedure? After the procedure, it is common to have:  A sore throat.  Mild stomach pain or discomfort.  Bloating.  Nausea. Follow these instructions at home:  Follow instructions from your health care provider about what to eat or drink after your procedure.  Return to your normal activities as told by your health care provider. Ask your health care provider what activities are safe for you.  Take over-the-counter and prescription medicines only as told by your health care provider.  If you were given a sedative during the procedure, it can affect you for several hours. Do not drive or operate machinery until your health care provider says that it is safe.  Keep all follow-up visits as told by your health care provider. This is important.   Contact a health care provider if you have:  A sore throat that lasts longer than one day.  Trouble swallowing. Get help right away if:  You vomit blood or your vomit looks like coffee grounds.  You have: ? A fever. ? Bloody, black, or tarry stools. ? A severe sore throat or you cannot swallow. ? Difficulty breathing. ? Severe pain in your chest or abdomen. Summary  After the procedure, it is common to have a sore throat, mild stomach discomfort, bloating, and nausea.  If you were given a sedative during the procedure, it can affect you for several hours. Do not drive or operate machinery until your health care provider says that it is safe.  Follow instructions from your health care provider about what to eat or drink after your procedure.  Return to your normal activities as told by your health care provider. This information is not intended to  replace advice given to you by your health care provider. Make sure you discuss any questions you have with your health care provider. Document Revised: 05/07/2019 Document Reviewed: 10/09/2017 Elsevier Patient Education  2021 [place]. https://www.asge.org/home/for-patients/patient-information/understanding-eso-dilation-updated">  Esophageal Dilatation Esophageal dilatation, also called esophageal dilation, is a procedure to widen or open a blocked or narrowed part of the esophagus. The esophagus is the part of the body that moves food and liquid from the mouth to the stomach. You may need this procedure if:  You have a buildup of scar tissue in your esophagus that makes it difficult, painful, or impossible to swallow. This can be caused by gastroesophageal reflux disease (GERD).  You have cancer of the esophagus.  There is a problem with how food moves through your esophagus. In some cases, you may need this procedure repeated at a later time to dilate the esophagus gradually. Tell a health care provider about:  Any allergies you have.  All medicines you are taking, including vitamins, herbs, eye drops, creams, and over-the-counter medicines.  Any problems you or family members have had with anesthetic medicines.  Any blood disorders you have.  Any surgeries you have had.  Any medical conditions you have.  Any antibiotic medicines you are required to take before dental procedures.  Whether you are pregnant or may be pregnant. What are the risks? Generally, this is a safe procedure. However, problems may occur, including:  Bleeding due to a tear in the lining of the esophagus.  A hole, or perforation, in  the esophagus. What happens before the procedure?  Ask your health care provider about: ? Changing or stopping your regular medicines. This is especially important if you are taking diabetes medicines or blood thinners. ? Taking medicines such as aspirin and ibuprofen.  These medicines can thin your blood. Do not take these medicines unless your health care provider tells you to take them. ? Taking over-the-counter medicines, vitamins, herbs, and supplements.  Follow instructions from your health care provider about eating or drinking restrictions.  Plan to have a responsible adult take you home from the hospital or clinic.  Plan to have a responsible adult care for you for the time you are told after you leave the hospital or clinic. This is important. What happens during the procedure?  You may be given a medicine to help you relax (sedative).  A numbing medicine may be sprayed into the back of your throat, or you may gargle the medicine.  Your health care provider may perform the dilatation using various surgical instruments, such as: ? Simple dilators. This instrument is carefully placed in the esophagus to stretch it. ? Guided wire bougies. This involves using an endoscope to insert a wire into the esophagus. A dilator is passed over this wire to enlarge the esophagus. Then the wire is removed. ? Balloon dilators. An endoscope with a small balloon is inserted into the esophagus. The balloon is inflated to stretch the esophagus and open it up. The procedure may vary among health care providers and hospitals. What can I expect after the procedure?  Your blood pressure, heart rate, breathing rate, and blood oxygen level will be monitored until you leave the hospital or clinic.  Your throat may feel slightly sore and numb. This will get better over time.  You will not be allowed to eat or drink until your throat is no longer numb.  When you are able to drink, urinate, and sit on the edge of the bed without nausea or dizziness, you may be able to return home. Follow these instructions at home:  Take over-the-counter and prescription medicines only as told by your health care provider.  If you were given a sedative during the procedure, it can affect  you for several hours. Do not drive or operate machinery until your health care provider says that it is safe.  Plan to have a responsible adult care for you for the time you are told. This is important.  Follow instructions from your health care provider about any eating or drinking restrictions.  Do not use any products that contain nicotine or tobacco, such as cigarettes, e-cigarettes, and chewing tobacco. If you need help quitting, ask your health care provider.  Keep all follow-up visits. This is important. Contact a health care provider if:  You have a fever.  You have pain that is not relieved by medicine. Get help right away if:  You have chest pain.  You have trouble breathing.  You have trouble swallowing.  You vomit blood.  You have black, tarry, or bloody stools. These symptoms may represent a serious problem that is an emergency. Do not wait to see if the symptoms will go away. Get medical help right away. Call your local emergency services (911 in the [place] Do not drive yourself to the hospital. Summary  Esophageal dilatation, also called esophageal dilation, is a procedure to widen or open a blocked or narrowed part of the esophagus.  Plan to have a responsible adult take you home from the  hospital or clinic.  For this procedure, a numbing medicine may be sprayed into the back of your throat, or you may gargle the medicine.  Do not drive or operate machinery until your health care provider says that it is safe. This information is not intended to replace advice given to you by your health care provider. Make sure you discuss any questions you have with your health care provider. Document Revised: 09/25/2019 Document Reviewed: 09/25/2019 Elsevier Patient Education  [place].   No aspirin or NSAIDs for 3 days. Resume other medications and diet as before. No driving for 24 hours. Office visit in 3 months - Left message for scheduler to call you and  schedule this appointment.

## 2020-06-11 NOTE — Op Note (Signed)
[facility] Patient Name: Valerie Griffin Procedure Date: 06/11/2020 7:20 AM MRN: [id number] Date of Birth: 12/14/1933 Attending MD: Hildred Laser , MD CSN: [id number] Age: 87 Admit Type: Outpatient Procedure:                Upper GI endoscopy Indications:              Esophageal dysphagia, For therapy of esophageal                            stricture Providers:                Hildred Laser, MD, Otis Peak B. Sharon Seller, RN, Raphael Gibney, Technician Referring MD:             Glenda Chroman, MD Medicines:                Lidocaine jelly, Meperidine 50 mg IV, Midazolam 5                            mg IV Complications:            No immediate complications. Estimated Blood Loss:     Estimated blood loss was minimal. Procedure:                Pre-Anesthesia Assessment:                           - Prior to the procedure, a History and Physical                            was performed, and patient medications and                            allergies were reviewed. The patient's tolerance of                            previous anesthesia was also reviewed. The risks                            and benefits of the procedure and the sedation                            options and risks were discussed with the patient.                            All questions were answered, and informed consent                            was obtained. Prior Anticoagulants: The patient has                            taken no previous anticoagulant or antiplatelet  agents except for aspirin. ASA Grade Assessment: II                            - A patient with mild systemic disease. After                            reviewing the risks and benefits, the patient was                            deemed in satisfactory condition to undergo the                            procedure.                           After obtaining informed consent, the endoscope was                             passed under direct vision. Throughout the                            procedure, the patient's blood pressure, pulse, and                            oxygen saturations were monitored continuously. The                            Endoscope was introduced through the mouth, and                            advanced to the second part of duodenum. The upper                            GI endoscopy was accomplished without difficulty.                            The patient tolerated the procedure well. Scope In: 7:45:07 AM Scope Out: 7:53:36 AM Total Procedure Duration: 0 hours 8 minutes 29 seconds  Findings:      The hypopharynx was normal.      The proximal esophagus and mid esophagus were normal.      LA Grade B (one or more mucosal breaks greater than 5 mm, not extending       between the tops of two mucosal folds) esophagitis with no bleeding was       found 30 to 33 cm from the incisors.      One benign-appearing, intrinsic moderate stenosis was found 33 cm from       the incisors. Pseudo-diverticulae secondary to scarring. This stenosis       measured less than one cm (in length). The stenosis was traversed. A TTS       dilator was passed through the scope. Dilation with a 15-16.5-18 mm       balloon dilator was performed to 15 mm and 16.5 mm. The dilation site       was examined and showed mild mucosal disruption, moderate  improvement in       luminal narrowing and no perforation.      A 5 cm hiatal hernia was present.      The entire examined stomach was normal.      The duodenal bulb and second portion of the duodenum were normal. Impression:               - Normal hypopharynx.                           - Normal proximal esophagus and mid esophagus.                           - LA Grade B reflux esophagitis with no bleeding.                           - Benign-appearing esophageal stenosis. Dilated.                           - 5 cm hiatal hernia.                            - Normal stomach.                           - Normal duodenal bulb and second portion of the                            duodenum.                           - No specimens collected. Moderate Sedation:      Moderate (conscious) sedation was administered by the endoscopy nurse       and supervised by the endoscopist. The following parameters were       monitored: oxygen saturation, heart rate, blood pressure, CO2       capnography and response to care. Total physician intraservice time was       13 minutes. Recommendation:           - Patient has a contact number available for                            emergencies. The signs and symptoms of potential                            delayed complications were discussed with the                            patient. Return to normal activities tomorrow.                            Written discharge instructions were provided to the                            patient.                           -  Resume previous diet today.                           - Continue present medications.                           - No aspirin, ibuprofen, naproxen, or other                            non-steroidal anti-inflammatory drugs for 3 days.                           - Repeat upper endoscopy PRN.                           - Return to GI clinic in 3 months. Procedure Code(s):        --- Professional ---                           [phone], Esophagogastroduodenoscopy, flexible,                            transoral; with transendoscopic balloon dilation of                            esophagus (less than 30 mm diameter)                           G0500, Moderate sedation services provided by the                            same physician or other qualified health care                            professional performing a gastrointestinal                            endoscopic service that sedation supports,                            requiring the presence of an independent  trained                            observer to assist in the monitoring of the                            patient's level of consciousness and physiological                            status; initial 15 minutes of intra-service time;                            patient age 36 years or older (additional time 105  be reported with [phone], as appropriate) Diagnosis Code(s):        --- Professional ---                           K21.00, Gastro-esophageal reflux disease with                            esophagitis, without bleeding                           K22.2, Esophageal obstruction                           K44.9, Diaphragmatic hernia without obstruction or                            gangrene                           R13.14, Dysphagia, pharyngoesophageal phase CPT copyright 2019 American Medical Association. All rights reserved. The codes documented in this report are preliminary and upon coder review may  be revised to meet current compliance requirements. Hildred Laser, MD Hildred Laser, MD 06/11/2020 8:08:06 AM This report has been signed electronically. Number of Addenda: 0

## 2020-06-15 ENCOUNTER — Encounter (HOSPITAL_COMMUNITY): Payer: Self-pay | Admitting: Internal Medicine

## 2020-06-26 ENCOUNTER — Encounter (INDEPENDENT_AMBULATORY_CARE_PROVIDER_SITE_OTHER): Payer: Self-pay

## 2020-11-03 ENCOUNTER — Encounter (INDEPENDENT_AMBULATORY_CARE_PROVIDER_SITE_OTHER): Payer: Self-pay | Admitting: Internal Medicine

## 2020-11-03 ENCOUNTER — Other Ambulatory Visit: Payer: Self-pay

## 2020-11-03 ENCOUNTER — Ambulatory Visit (INDEPENDENT_AMBULATORY_CARE_PROVIDER_SITE_OTHER): Payer: Medicare Other | Admitting: Internal Medicine

## 2020-11-03 VITALS — BP 169/82 | HR 76 | Temp 98.4°F | Ht 65.5 in | Wt 192.0 lb

## 2020-11-03 DIAGNOSIS — K2100 Gastro-esophageal reflux disease with esophagitis, without bleeding: Secondary | ICD-10-CM | POA: Diagnosis not present

## 2020-11-03 DIAGNOSIS — K222 Esophageal obstruction: Secondary | ICD-10-CM

## 2020-11-03 NOTE — Patient Instructions (Signed)
Please call office if swallowing difficulty recurs.

## 2020-11-03 NOTE — Progress Notes (Signed)
Presenting complaint;  Follow for chronic GERD complicated by distal esophageal stricture.  Database and subjective:  Patient is 87 year old Caucasian female who has several year history of erosive/ulcerative reflux esophagitis as well as short segment Barrett's esophagus who has required multiple dilations over the last couple of years.  She also underwent dilation by Dr. Doristine Mango of [facility] until he relocated elsewhere. Patient says she has been able to swallow much better since her last dilation about in January this year.  She tries to avoid dried meat.  She has not had any episode of food impaction since her last dilation.  She has heartburn no more than once a week with certain foods such as spaghetti.  She chews her food already and eats very slowly.  She denies abdominal pain or rectal bleeding.  Her stools are dark since she has been on ferrous sulfate.  She is taking stool softener at bedtime to prevent constipation. She has good appetite.  She has lost 6 pounds since her last visit of December 2021.  Patient states that she was diagnosed with pulmonary embolism in March 2022.  No history of long travel.  Doppler study of lower extremities was negative.  She is not sure how long she would be on blood thinners. She says her hemoglobin 1 month ago was over 11 g.   Current Medications: Outpatient Encounter Medications as of 11/03/2020  Medication Sig   aspirin EC 81 MG tablet Take 1 tablet (81 mg total) by mouth every evening.   Cholecalciferol (VITAMIN D) 50 MCG (2000 UT) tablet Take 2,000 Units by mouth daily.   docusate sodium (COLACE) 100 MG capsule Take 100 mg by mouth at bedtime.   esomeprazole (NEXIUM) 20 MG capsule Take 1 capsule (20 mg total) by mouth 2 (two) times daily before a meal. (Patient taking differently: Take 20 mg by mouth daily before breakfast.)   Ferrous Sulfate (IRON) 28 MG TABS Take 28 mg by mouth in the morning and at bedtime.     HYDROcodone-acetaminophen (NORCO/VICODIN) 5-325 MG tablet Take 1 tablet by mouth every 6 (six) hours as needed for moderate pain.   ibuprofen (ADVIL) 200 MG tablet Take 200 mg by mouth every 8 (eight) hours as needed (for pain.).   Multiple Vitamin (MULTIVITAMIN WITH MINERALS) TABS tablet Take 1 tablet by mouth daily. Centrum Silver   Rivaroxaban (XARELTO) 15 MG TABS tablet Take 15 mg by mouth daily at 6 (six) AM.   TURMERIC PO Take 1,000 mg by mouth daily.   [DISCONTINUED] Wheat Dextrin (BENEFIBER DRINK MIX) PACK Take 4 g by mouth at bedtime.   No facility-administered encounter medications on file as of 11/03/2020.     Objective: Blood pressure (!) 169/82, pulse 76, temperature 98.4 F (36.9 C), temperature source Oral, height 5' 5.5" (1.664 m), weight 192 lb (87.1 kg). Patient is alert and in no acute distress. Conjunctiva is pink. Sclera is nonicteric Oropharyngeal mucosa is normal. No neck masses or thyromegaly noted. Cardiac exam with regular rhythm normal S1 and S2. No murmur or gallop noted. Lungs are clear to auscultation. Abdomen is full but soft and nontender with organomegaly or masses. No LE edema or clubbing noted.  Labs/studies Results:   CBC Latest Ref Rng & Units 08/01/2019 05/29/2019 05/09/2019  WBC 4.0 - 10.5 K/uL - 6.1 5.9  Hemoglobin 12.0 - 15.0 g/dL 8.6(L) 8.0(L) 8.1(L)  Hematocrit 36.0 - 46.0 % 29.0(L) 27.9(L) 26.5(L)  Platelets 150 - 400 K/uL - 367 334  CMP Latest Ref Rng & Units 05/09/2019 11/21/2017  Glucose 65 - 139 mg/dL 93 103(H)  BUN 7 - 25 mg/dL 12 14  Creatinine 0.60 - 0.88 mg/dL 1.02(H) 1.30(H)  Sodium 135 - 146 mmol/L 141 138  Potassium 3.5 - 5.3 mmol/L 4.3 4.3  Chloride 98 - 110 mmol/L 107 104  CO2 20 - 32 mmol/L 23 -  Calcium 8.6 - 10.4 mg/dL 9.2 -  Total Protein 6.1 - 8.1 g/dL 6.8 -  Total Bilirubin 0.2 - 1.2 mg/dL 0.5 -  AST 10 - 35 U/L 19 -  ALT 6 - 29 U/L 9 -    Hepatic Function Latest Ref Rng & Units 05/09/2019  Total Protein 6.1 -  8.1 g/dL 6.8  AST 10 - 35 U/L 19  ALT 6 - 29 U/L 9  Total Bilirubin 0.2 - 1.2 mg/dL 0.5      Assessment:  #1.  Esophageal stricture.  She has recalcitrant distal esophageal stricture which has been hard to keep open.  She has done well since her last dilation of January 2022.  #2.  Erosive/ulcerative reflux esophagitis complicated by short segment Barrett's esophagus.  Heartburn is well controlled with therapy.  #3.  History of iron deficiency anemia felt to be due to blood loss from upper GI tract and she also may have impaired iron absorption due to chronic acid suppression.  She has not had a colonoscopy since January, 2011.   Plan:  Patient will continue esomeprazole at 40 mg p.o. daily.  She can take famotidine OTC or antacids for breakthrough heartburn. Patient will call if dysphagia worsens.

## 2021-04-08 ENCOUNTER — Encounter (INDEPENDENT_AMBULATORY_CARE_PROVIDER_SITE_OTHER): Payer: Self-pay | Admitting: Gastroenterology

## 2021-05-04 ENCOUNTER — Ambulatory Visit (INDEPENDENT_AMBULATORY_CARE_PROVIDER_SITE_OTHER): Payer: Medicare Other | Admitting: Internal Medicine

## 2021-05-06 ENCOUNTER — Other Ambulatory Visit: Payer: Self-pay

## 2021-05-06 ENCOUNTER — Ambulatory Visit (INDEPENDENT_AMBULATORY_CARE_PROVIDER_SITE_OTHER): Payer: Medicare Other | Admitting: Gastroenterology

## 2021-05-06 ENCOUNTER — Encounter (INDEPENDENT_AMBULATORY_CARE_PROVIDER_SITE_OTHER): Payer: Self-pay | Admitting: Gastroenterology

## 2021-05-06 DIAGNOSIS — R1319 Other dysphagia: Secondary | ICD-10-CM | POA: Diagnosis not present

## 2021-05-06 DIAGNOSIS — K219 Gastro-esophageal reflux disease without esophagitis: Secondary | ICD-10-CM | POA: Diagnosis not present

## 2021-05-06 DIAGNOSIS — K5900 Constipation, unspecified: Secondary | ICD-10-CM | POA: Diagnosis not present

## 2021-05-06 NOTE — Patient Instructions (Signed)
continue with chewing precautions, small bites and sips of water between bites. She will let me know if dysphagia worsens.  Continue with nexium 20mg  daily   Please continue to follow with hematology at [facility] for your iron deficiency anemia, please let me know if you develop rectal bleeding, black stools, fatigue, shortness of breath or dizziness.   You should make sure you are staying well hydrated and eating a diet high in fruits, veggies and fiber filled foods. You can continue colace daily as needed for constipation.   Please let me know if you develop any new or worsening symptoms.   Follow up in 6 months

## 2021-05-06 NOTE — Progress Notes (Signed)
Primary Care Physician:  Glenda Chroman, MD  Primary GI: [facility]  Patient Location: Home   Provider Location: [facility]   Reason for Visit: follow up of dysphagia/GERD   Persons present on the virtual encounter, with roles: Valerie Griffin. Valerie Griffin, patient and Valerie Magowan L. Valerie Griffin, Provider   Total time (minutes) spent on medical discussion: 10 minutes   Due to COVID-19, visit was conducted using virtual method.  Visit was requested by patient.  Virtual Visit via telephone Due to COVID-19, visit is conducted virtually and was requested by patient.   I connected with Valerie Griffin on 05/06/21 at 10:45 AM EST by telephone and verified that I am speaking with the correct person using two identifiers.   I discussed the limitations, risks, security and privacy concerns of performing an evaluation and management service by telephone and the availability of in person appointments. I also discussed with the patient that there may be a patient responsible charge related to this service. The patient expressed understanding and agreed to proceed.  Chief Complaint  Patient presents with   Gastroesophageal Reflux    Pt is doing a phone visit today due to her having covid. States she is doing ok with covid. Pt states she has been able to eat better since having her esophagus stretched. Takes otc nexium 20mg  for reflux and states that is doing well.  Takes colace once daily for constipation. States she is doing well with that.    History of Present Illness: Valerie Griffin is presenting today via telephone for follow up of dysphagia and GERD, as she currently has Covid 19 and was unable to have in person visit. She was diagnosed with covid this past week, she reports she is still coughing, she is feeling some better, fevers, chills and headache have improved.   Patient has several year history of erosive/ulcerative reflux esophagitis as well as short segment Barrett's esophagus who has  required multiple dilations in the past. She reports that swallowing has improved since having EGD with dilation in January 2022, she states that occasionally when she eats chicken she may have some issues with it feeling like it does not want to go down and sometimes she has to go cough it up, otherwise she reports that symptoms are almost 100% better. She chews food thoroughly and tries to take smaller bites to prevent choking. No episodes of food impaction.   She is doing well on otc nexium 20mg  and is only taking this once daily, she denies any issues with acid reflux symptoms unless she misses her daily PPI dose. When this happens she will take a tums which will take care of her symptoms. Otherwise GERD is well controlled.   She has history of IDA, she is currently maintained on ferrous sulfate 28mg  once daily. She has had no rectal bleeding or melena. She denies fatigue or sob, she is followed by Hematology at [facility], last reviewable hgb in April was 11.5  She denies any weight loss or changes in her appetite. She does have continued constipation, she takes colace once daily which works well for her. She has a BM once per day. She denies abdominal pain, nausea, vomiting or early satiety.   Last colonoscopy:2011, 1 small adenoma  Last EGD: 08/21/20- Normal hypopharynx. - Normal proximal esophagus and mid esophagus. - LA Grade B reflux esophagitis with no bleeding. - Benign-appearing esophageal stenosis. Dilated. - 5 cm hiatal hernia. - Normal stomach. - Normal duodenal bulb and  second portion of the duodenum. - No specimens collected.   Past Medical History:  Diagnosis Date   Anemia    Arthritis    Constipation    Dysphagia 08/16/2016   GERD (gastroesophageal reflux disease)      Past Surgical History:  Procedure Laterality Date   ABDOMINAL HYSTERECTOMY     APPENDECTOMY     BALLOON DILATION N/A 06/11/2020   Procedure: BALLOON DILATION;  Surgeon: Rogene Houston, MD;   [facility];  Service: Endoscopy;  Laterality: N/A;   broken arm and wrist with plate and scews rt wrist Right    CHOLECYSTECTOMY     COLONOSCOPY     complete hysterectomy'     fibroid tumors bleeding   ESOPHAGEAL DILATION N/A 08/22/2016   Procedure: ESOPHAGEAL DILATION;  Surgeon: Rogene Houston, MD;  [facility];  Service: Endoscopy;  Laterality: N/A;   ESOPHAGEAL DILATION N/A 09/14/2016   Procedure: ESOPHAGEAL DILATION;  Surgeon: Rogene Houston, MD;  [facility];  Service: Endoscopy;  Laterality: N/A;   ESOPHAGEAL DILATION N/A 07/26/2017   Procedure: ESOPHAGEAL DILATION;  Surgeon: Rogene Houston, MD;  [facility];  Service: Endoscopy;  Laterality: N/A;   ESOPHAGEAL DILATION N/A 08/31/2017   Procedure: ESOPHAGEAL DILATION;  Surgeon: Rogene Houston, MD;  [facility];  Service: Endoscopy;  Laterality: N/A;   ESOPHAGEAL DILATION N/A 10/26/2017   Procedure: ESOPHAGEAL DILATION;  Surgeon: Rogene Houston, MD;  [facility];  Service: Endoscopy;  Laterality: N/A;   ESOPHAGEAL DILATION N/A 02/07/2018   Procedure: ESOPHAGEAL DILATION;  Surgeon: Rogene Houston, MD;  [facility];  Service: Endoscopy;  Laterality: N/A;   ESOPHAGEAL DILATION N/A 05/31/2019   Procedure: ESOPHAGEAL DILATION;  Surgeon: Rogene Houston, MD;  [facility];  Service: Endoscopy;  Laterality: N/A;   ESOPHAGEAL DILATION N/A 06/14/2019   Procedure: ESOPHAGEAL DILATION;  Surgeon: Rogene Houston, MD;  [facility];  Service: Endoscopy;  Laterality: N/A;   ESOPHAGEAL DILATION N/A 07/04/2019   Procedure: ESOPHAGEAL DILATION;  Surgeon: Rogene Houston, MD;  [facility];  Service: Endoscopy;  Laterality: N/A;  730   ESOPHAGEAL DILATION N/A 08/01/2019   Procedure: ESOPHAGEAL DILATION;  Surgeon: Rogene Houston, MD;  [facility];  Service: Endoscopy;  Laterality: N/A;   ESOPHAGEAL DILATION N/A 05/14/2020    Procedure: ESOPHAGEAL DILATION;  Surgeon: Rogene Houston, MD;  [facility];  Service: Endoscopy;  Laterality: N/A;   ESOPHAGOGASTRODUODENOSCOPY N/A 08/22/2016   Procedure: ESOPHAGOGASTRODUODENOSCOPY (EGD);  Surgeon: Rogene Houston, MD;  [facility];  Service: Endoscopy;  Laterality: N/A;   ESOPHAGOGASTRODUODENOSCOPY N/A 09/14/2016   Procedure: ESOPHAGOGASTRODUODENOSCOPY (EGD);  Surgeon: Rogene Houston, MD;  [facility];  Service: Endoscopy;  Laterality: N/A;  1225   ESOPHAGOGASTRODUODENOSCOPY N/A 07/26/2017   Procedure: ESOPHAGOGASTRODUODENOSCOPY (EGD);  Surgeon: Rogene Houston, MD;  [facility];  Service: Endoscopy;  Laterality: N/A;  1:45   ESOPHAGOGASTRODUODENOSCOPY N/A 08/31/2017   Procedure: ESOPHAGOGASTRODUODENOSCOPY (EGD);  Surgeon: Rogene Houston, MD;  [facility];  Service: Endoscopy;  Laterality: N/A;   ESOPHAGOGASTRODUODENOSCOPY N/A 10/26/2017   Procedure: ESOPHAGOGASTRODUODENOSCOPY (EGD);  Surgeon: Rogene Houston, MD;  [facility];  Service: Endoscopy;  Laterality: N/A;  1115   ESOPHAGOGASTRODUODENOSCOPY N/A 02/07/2018   Procedure: ESOPHAGOGASTRODUODENOSCOPY (EGD);  Surgeon: Rogene Houston, MD;  [facility];  Service: Endoscopy;  Laterality: N/A;  830   ESOPHAGOGASTRODUODENOSCOPY N/A 07/04/2019   Procedure: ESOPHAGOGASTRODUODENOSCOPY (EGD);  Surgeon: Rogene Houston, MD;  [facility];  Service: Endoscopy;  Laterality: N/A;  730   ESOPHAGOGASTRODUODENOSCOPY N/A 08/01/2019   Procedure: ESOPHAGOGASTRODUODENOSCOPY (EGD);  Surgeon: Rogene Houston, MD;  [facility];  Service: Endoscopy;  Laterality: N/A;  125   ESOPHAGOGASTRODUODENOSCOPY N/A 05/14/2020   Procedure: ESOPHAGOGASTRODUODENOSCOPY (EGD);  Surgeon: Rogene Houston, MD;  [facility];  Service: Endoscopy;  Laterality: N/A;  10:30   ESOPHAGOGASTRODUODENOSCOPY N/A 06/11/2020   Procedure:  ESOPHAGOGASTRODUODENOSCOPY (EGD);  Surgeon: Rogene Houston, MD;  [facility];  Service: Endoscopy;  Laterality: N/A;  7:30   ESOPHAGOGASTRODUODENOSCOPY (EGD) WITH PROPOFOL N/A 05/31/2019   Procedure: ESOPHAGOGASTRODUODENOSCOPY (EGD) WITH PROPOFOL;  Surgeon: Rogene Houston, MD;  [facility];  Service: Endoscopy;  Laterality: N/A;  2:35   ESOPHAGOGASTRODUODENOSCOPY (EGD) WITH PROPOFOL N/A 06/14/2019   Procedure: ESOPHAGOGASTRODUODENOSCOPY (EGD) WITH PROPOFOL;  Surgeon: Rogene Houston, MD;  [facility];  Service: Endoscopy;  Laterality: N/A;   FOREIGN BODY REMOVAL  05/31/2019   Procedure: FOREIGN BODY REMOVAL;  Surgeon: Rogene Houston, MD;  [facility];  Service: Endoscopy;;     Current Meds  Medication Sig   aspirin EC 81 MG tablet Take 1 tablet (81 mg total) by mouth every evening.   Cholecalciferol (VITAMIN D) 50 MCG (2000 UT) tablet Take 2,000 Units by mouth daily.   docusate sodium (COLACE) 100 MG capsule Take 100 mg by mouth at bedtime.   esomeprazole (NEXIUM) 20 MG capsule Take 1 capsule (20 mg total) by mouth 2 (two) times daily before a meal. (Patient taking differently: Take 20 mg by mouth daily before breakfast. Takes once daily. Gets OTC)   Ferrous Sulfate (IRON) 28 MG TABS Take 28 mg by mouth. One qam   HYDROcodone-acetaminophen (NORCO/VICODIN) 5-325 MG tablet Take 1 tablet by mouth every 6 (six) hours as needed for moderate pain.   Multiple Vitamin (MULTIVITAMIN WITH MINERALS) TABS tablet Take 1 tablet by mouth daily. Centrum Silver   TURMERIC PO Take 1,000 mg by mouth daily.     Family History  Problem Relation Age of Onset   Heart Problems Mother    Alzheimer's disease Mother    Hypertension Mother    Prostate cancer Father    Heart attack Father    Heart Problems Sister     Social History   Socioeconomic History   Marital status: Widowed    Spouse name: Not on file   Number of children: Not on file   Years of  education: Not on file   Highest education level: Not on file  Occupational History   Not on file  Tobacco Use   Smoking status: Never   Smokeless tobacco: Never  Vaping Use   Vaping Use: Never used  Substance and Sexual Activity   Alcohol use: No   Drug use: No   Sexual activity: Not on file  Other Topics Concern   Not on file  Social History Narrative   Not on file   Social Determinants of Health   Financial Resource Strain: Not on file  Food Insecurity: Not on file  Transportation Needs: Not on file  Physical Activity: Not on file  Stress: Not on file  Social Connections: Not on file    Review of Systems: Gen:+fever +chills (currently has covid), anorexia. Denies fatigue, weakness, weight loss.  CV: Denies chest pain, palpitations, syncope, peripheral edema, and claudication. Resp: Denies dyspnea at rest, cough, wheezing, coughing up blood, and pleurisy. GI: see HPI Derm: Denies rash, itching, dry skin Psych: Denies depression, anxiety, memory loss, confusion. No homicidal or suicidal ideation.  Heme: Denies bruising, bleeding, and enlarged lymph nodes.  Observations/Objective: No distress. Unable to perform physical exam due to telephone encounter. No video available.   Assessment and Plan: Dysphagia has mostly resolved since EGD with dilation in January 2022, she has very occasional issues when eating chicken, otherwise she reports no episodes of dysphagia with pills, liquids or other foods. She should continue with chewing precautions, small bites and sips of water between bites. She will let me know if dysphagia worsens.  GERD is well managed with once daily otc nexium 20mg , she occasionally misses a dose and will experience some heartburn for which she takes a tums with resolution. We will continue with current PPI dosing.  IDA, she is followed by hematology for this at [facility], she denies rectal bleeding, melena, fatigue, shob or dizziness. Last hgb  reviewable was in April 2022, normal at 11.5, she is on ferrous sulfate 28g once daily. She will let me know if she develops any fatigue, sob, dizziness or rectal bleeding.  Constipation is well controlled with 1 colace per day, she has no diarrhea, bowels move everyday without issue. She prefers to continue with current regimen.  Follow Up Instructions: Follow up 6 months   I discussed the assessment and treatment plan with the patient. The patient was provided an opportunity to ask questions and all were answered. The patient agreed with the plan and demonstrated an understanding of the instructions.   The patient was advised to call back or seek an in-person evaluation if the symptoms worsen or if the condition fails to improve as anticipated.  I provided 10 minutes of communication time during this telephone encounter.  Howard Bunte L. Alver Sorrow, MSN, APRN, AGNP-C Adult-Gerontology Nurse Practitioner [facility]

## 2021-11-04 ENCOUNTER — Ambulatory Visit (INDEPENDENT_AMBULATORY_CARE_PROVIDER_SITE_OTHER): Payer: Medicare Other | Admitting: Gastroenterology

## 2021-11-11 ENCOUNTER — Ambulatory Visit (INDEPENDENT_AMBULATORY_CARE_PROVIDER_SITE_OTHER): Payer: Medicare Other | Admitting: Gastroenterology

## 2021-11-11 ENCOUNTER — Encounter (INDEPENDENT_AMBULATORY_CARE_PROVIDER_SITE_OTHER): Payer: Self-pay | Admitting: Gastroenterology

## 2021-11-11 VITALS — BP 153/66 | HR 80 | Temp 98.5°F | Ht 65.5 in | Wt 187.9 lb

## 2021-11-11 DIAGNOSIS — K219 Gastro-esophageal reflux disease without esophagitis: Secondary | ICD-10-CM

## 2021-11-11 DIAGNOSIS — R1319 Other dysphagia: Secondary | ICD-10-CM | POA: Diagnosis not present

## 2021-11-11 DIAGNOSIS — K5900 Constipation, unspecified: Secondary | ICD-10-CM | POA: Diagnosis not present

## 2021-11-11 NOTE — Patient Instructions (Signed)
Please continue nexium '20mg'$  once daily and make sure you are avoiding trigger foods, chewing thoroughly and taking small bites. Stay upright 2-3 hours after eating prior to lying down.  Increase you water intake to help with constipation.  We will see you again in 1 year, please let us know if you have new or worsening symptoms

## 2021-11-11 NOTE — Progress Notes (Unsigned)
Referring Provider: Glenda Chroman, MD Primary Care Physician:  Glenda Chroman, MD Primary GI Physician: previously [facility]  Chief Complaint  Patient presents with   Gastroesophageal Reflux    6 month follow up. Last seen for dysphagia , gerd, IDA and constipation.  Still has some trouble swallowing meats. Still has trouble with constipation.states she eats a lot of cheese. Has a stool every day to every other day. Takes colace '100mg'$  one qhs. States rarely has any trouble with reflux unless eating tomoatoe products.    HPI:   Valerie Griffin is a 88 y.o. female with past medical history of anemia, arthritis, constipation, dysphagia, esophageal stenosis, GERD.   Patient presenting today for follow up of GERD.  History: Patient with several year history of erosive/ulcerative reflux esophagitis as well as short segment Barrett's esophagus who has required multiple dilations in the past.  Last seen virtually 05/06/21, at that time  She reported that swallowing has improved since having EGD with dilation in January 2022, symptoms almost completely improved except with occasional meats.  She chews food thoroughly and tries to take smaller bites to prevent choking. No episodes of food impaction. Doing well on nexium '20mg'$  daily at that time. Followed by hematology for IDA, she denied rectal bleeding, melena or fatigue. Last hgb in April 2022 was 11.5, maintained on daily iron pill. Constipation well controlled with once daily colace and she preferred to continue that regimen.    Present: She states she is dealing with pain from shingles that she had in December, rash is gone but she is still having pain.   GERD: doing pretty well on nexium '20mg'$  daily, will have very occasional heartburn if she eats tomatoes, will take tums as needed, reports maybe once per month. She is trying to avoid triggers.  Dysphagia: still has some occasional issues with meats or raw veggies but usually she does not have  much issue. Most of the time does not have to cough substance back up but recently ate some watermelon that did not want to pass and she had to go cough it back up. She state that this is the first time this has happened in a while. She cuts her meats up very fine.   She denies abdominal pain, rectal bleeding or melena. Reports constipation since she was a chlid, having a BM daily to every other day, she is maintained on colace '100mg'$  QHS, this seems to work well for her. She does not drink much water, mostly diet coke.   She is still seeing hematology, last saw them in April and hgb was 13. She is continued on iron pills.    Last colonoscopy:2011, 1 small adenoma Last EGD: 08/21/20- Normal hypopharynx. - Normal proximal esophagus and mid esophagus. - LA Grade B reflux esophagitis with no bleeding. - Benign-appearing esophageal stenosis. Dilated. - 5 cm hiatal hernia. - Normal stomach. - Normal duodenal bulb and second portion of the duodenum. - No specimens collected.    Past Medical History:  Diagnosis Date   Anemia    Arthritis    Constipation    Dysphagia 08/16/2016   Esophageal stenosis    GERD (gastroesophageal reflux disease)     Past Surgical History:  Procedure Laterality Date   ABDOMINAL HYSTERECTOMY     APPENDECTOMY     BALLOON DILATION N/A 06/11/2020   Procedure: BALLOON DILATION;  Surgeon: Rogene Houston, MD;  [facility];  Service: Endoscopy;  Laterality: N/A;   broken  arm and wrist with plate and scews rt wrist Right    CHOLECYSTECTOMY     COLONOSCOPY     complete hysterectomy'     fibroid tumors bleeding   ESOPHAGEAL DILATION N/A 08/22/2016   Procedure: ESOPHAGEAL DILATION;  Surgeon: Rogene Houston, MD;  [facility];  Service: Endoscopy;  Laterality: N/A;   ESOPHAGEAL DILATION N/A 09/14/2016   Procedure: ESOPHAGEAL DILATION;  Surgeon: Rogene Houston, MD;  [facility];  Service: Endoscopy;  Laterality: N/A;   ESOPHAGEAL  DILATION N/A 07/26/2017   Procedure: ESOPHAGEAL DILATION;  Surgeon: Rogene Houston, MD;  [facility];  Service: Endoscopy;  Laterality: N/A;   ESOPHAGEAL DILATION N/A 08/31/2017   Procedure: ESOPHAGEAL DILATION;  Surgeon: Rogene Houston, MD;  [facility];  Service: Endoscopy;  Laterality: N/A;   ESOPHAGEAL DILATION N/A 10/26/2017   Procedure: ESOPHAGEAL DILATION;  Surgeon: Rogene Houston, MD;  [facility];  Service: Endoscopy;  Laterality: N/A;   ESOPHAGEAL DILATION N/A 02/07/2018   Procedure: ESOPHAGEAL DILATION;  Surgeon: Rogene Houston, MD;  [facility];  Service: Endoscopy;  Laterality: N/A;   ESOPHAGEAL DILATION N/A 05/31/2019   Procedure: ESOPHAGEAL DILATION;  Surgeon: Rogene Houston, MD;  [facility];  Service: Endoscopy;  Laterality: N/A;   ESOPHAGEAL DILATION N/A 06/14/2019   Procedure: ESOPHAGEAL DILATION;  Surgeon: Rogene Houston, MD;  [facility];  Service: Endoscopy;  Laterality: N/A;   ESOPHAGEAL DILATION N/A 07/04/2019   Procedure: ESOPHAGEAL DILATION;  Surgeon: Rogene Houston, MD;  [facility];  Service: Endoscopy;  Laterality: N/A;  730   ESOPHAGEAL DILATION N/A 08/01/2019   Procedure: ESOPHAGEAL DILATION;  Surgeon: Rogene Houston, MD;  [facility];  Service: Endoscopy;  Laterality: N/A;   ESOPHAGEAL DILATION N/A 05/14/2020   Procedure: ESOPHAGEAL DILATION;  Surgeon: Rogene Houston, MD;  [facility];  Service: Endoscopy;  Laterality: N/A;   ESOPHAGOGASTRODUODENOSCOPY N/A 08/22/2016   Procedure: ESOPHAGOGASTRODUODENOSCOPY (EGD);  Surgeon: Rogene Houston, MD;  [facility];  Service: Endoscopy;  Laterality: N/A;   ESOPHAGOGASTRODUODENOSCOPY N/A 09/14/2016   Procedure: ESOPHAGOGASTRODUODENOSCOPY (EGD);  Surgeon: Rogene Houston, MD;  [facility];  Service: Endoscopy;  Laterality: N/A;  1225   ESOPHAGOGASTRODUODENOSCOPY N/A 07/26/2017   Procedure:  ESOPHAGOGASTRODUODENOSCOPY (EGD);  Surgeon: Rogene Houston, MD;  [facility];  Service: Endoscopy;  Laterality: N/A;  1:45   ESOPHAGOGASTRODUODENOSCOPY N/A 08/31/2017   Procedure: ESOPHAGOGASTRODUODENOSCOPY (EGD);  Surgeon: Rogene Houston, MD;  [facility];  Service: Endoscopy;  Laterality: N/A;   ESOPHAGOGASTRODUODENOSCOPY N/A 10/26/2017   Procedure: ESOPHAGOGASTRODUODENOSCOPY (EGD);  Surgeon: Rogene Houston, MD;  [facility];  Service: Endoscopy;  Laterality: N/A;  1115   ESOPHAGOGASTRODUODENOSCOPY N/A 02/07/2018   Procedure: ESOPHAGOGASTRODUODENOSCOPY (EGD);  Surgeon: Rogene Houston, MD;  [facility];  Service: Endoscopy;  Laterality: N/A;  830   ESOPHAGOGASTRODUODENOSCOPY N/A 07/04/2019   Procedure: ESOPHAGOGASTRODUODENOSCOPY (EGD);  Surgeon: Rogene Houston, MD;  [facility];  Service: Endoscopy;  Laterality: N/A;  730   ESOPHAGOGASTRODUODENOSCOPY N/A 08/01/2019   Procedure: ESOPHAGOGASTRODUODENOSCOPY (EGD);  Surgeon: Rogene Houston, MD;  [facility];  Service: Endoscopy;  Laterality: N/A;  125   ESOPHAGOGASTRODUODENOSCOPY N/A 05/14/2020   Procedure: ESOPHAGOGASTRODUODENOSCOPY (EGD);  Surgeon: Rogene Houston, MD;  [facility];  Service: Endoscopy;  Laterality: N/A;  10:30   ESOPHAGOGASTRODUODENOSCOPY N/A 06/11/2020   Procedure: ESOPHAGOGASTRODUODENOSCOPY (EGD);  Surgeon: Rogene Houston, MD;  [facility];  Service: Endoscopy;  Laterality: N/A;  7:30   ESOPHAGOGASTRODUODENOSCOPY (EGD) WITH PROPOFOL N/A 05/31/2019   Procedure: ESOPHAGOGASTRODUODENOSCOPY (EGD) WITH PROPOFOL;  Surgeon: Rogene Houston, MD;  [facility];  Service: Endoscopy;  Laterality: N/A;  2:35   ESOPHAGOGASTRODUODENOSCOPY (EGD) WITH PROPOFOL N/A 06/14/2019   Procedure: ESOPHAGOGASTRODUODENOSCOPY (EGD) WITH PROPOFOL;  Surgeon: Rogene Houston, MD;  [facility];  Service: Endoscopy;  Laterality: N/A;    FOREIGN BODY REMOVAL  05/31/2019   Procedure: FOREIGN BODY REMOVAL;  Surgeon: Rogene Houston, MD;  [facility];  Service: Endoscopy;;    Current Outpatient Medications  Medication Sig Dispense Refill   Ascorbic Acid (VITAMIN C) 1000 MG tablet Take 1,000 mg by mouth daily.     aspirin EC 81 MG tablet Take 1 tablet (81 mg total) by mouth every evening. 30 tablet 11   Cholecalciferol (VITAMIN D) 50 MCG (2000 UT) tablet Take 2,000 Units by mouth daily.     docusate sodium (COLACE) 100 MG capsule Take 100 mg by mouth at bedtime.     esomeprazole (NEXIUM) 20 MG capsule Take 1 capsule (20 mg total) by mouth 2 (two) times daily before a meal. (Patient taking differently: Take 20 mg by mouth daily before breakfast. Takes once daily. Gets OTC)     Ferrous Sulfate (IRON) 28 MG TABS Take 28 mg by mouth. One qam     gabapentin (NEURONTIN) 300 MG capsule Take 300 mg by mouth. Two capsules tid     HYDROcodone-acetaminophen (NORCO/VICODIN) 5-325 MG tablet Take 1 tablet by mouth every 6 (six) hours as needed for moderate pain.     Multiple Vitamin (MULTIVITAMIN WITH MINERALS) TABS tablet Take 1 tablet by mouth daily. Centrum Silver     TURMERIC PO Take 1,000 mg by mouth daily.     No current facility-administered medications for this visit.    Allergies as of 11/11/2021 - Review Complete 11/11/2021  Allergen Reaction Noted   Penicillins Rash and Other (See Comments) 08/16/2016    Family History  Problem Relation Age of Onset   Heart Problems Mother    Alzheimer's disease Mother    Hypertension Mother    Prostate cancer Father    Heart attack Father    Heart Problems Sister     Social History   Socioeconomic History   Marital status: Widowed    Spouse name: Not on file   Number of children: Not on file   Years of education: Not on file   Highest education level: Not on file  Occupational History   Not on file  Tobacco Use   Smoking status: Never    Passive exposure: Current    Smokeless tobacco: Never  Vaping Use   Vaping Use: Never used  Substance and Sexual Activity   Alcohol use: No   Drug use: No   Sexual activity: Not on file  Other Topics Concern   Not on file  Social History Narrative   Not on file   Social Determinants of Health   Financial Resource Strain: Not on file  Food Insecurity: Not on file  Transportation Needs: Not on file  Physical Activity: Not on file  Stress: Not on file  Social Connections: Not on file    Review of systems General: negative for malaise, night sweats, fever, chills, weight los Neck: Negative for lumps,  goiter, pain and significant neck swelling Resp: Negative for cough, wheezing, dyspnea at rest CV: Negative for chest pain, leg swelling, palpitations, orthopnea GI: denies melena, hematochezia, nausea, vomiting, diarrhea, constipation, dysphagia, odyonophagia, early satiety or unintentional weight loss.  MSK: Negative for joint pain or swelling, back pain, and muscle pain. Derm: Negative for itching or rash Psych: Denies depression, anxiety, memory loss, confusion. No homicidal or suicidal ideation.  Heme: Negative for prolonged bleeding, bruising easily, and swollen nodes. Endocrine: Negative for cold or heat intolerance, polyuria, polydipsia and goiter. Neuro: negative for tremor, gait imbalance, syncope and seizures. The remainder of the review of systems is noncontributory.  Physical Exam: BP (!) 153/66 (BP Location: Left Arm, Patient Position: Sitting, Cuff Size: Large)   Pulse 80   Temp 98.5 F (36.9 C) (Oral)   Ht 5' 5.5" (1.664 m)   Wt 187 lb 14.4 oz (85.2 kg)   BMI 30.79 kg/m  General:   Alert and oriented. No distress noted. Pleasant and cooperative.  Head:  Normocephalic and atraumatic. Eyes:  Conjuctiva clear without scleral icterus. Mouth:  Oral mucosa pink and moist. Good dentition. No lesions. Heart: Normal rate and rhythm, s1 and s2 heart sounds present.  Lungs: Clear lung sounds in  all lobes. Respirations equal and unlabored. Abdomen:  +BS, soft, non-tender and non-distended. No rebound or guarding. No HSM or masses noted. Derm: No palmar erythema or jaundice Msk:  Symmetrical without gross deformities. Normal posture. Extremities:  Without edema. Neurologic:  Alert and  oriented x4 Psych:  Alert and cooperative. Normal mood and affect.  Invalid input(s): "6 MONTHS"   ASSESSMENT: Valerie Griffin is a 88 y.o. female presenting today    PLAN:  Continue nexium '20mg'$  daily  2. Reflux precautions  3. Increase water intake    Follow Up: ***  Valerie Griffin L. Alver Sorrow, MSN, APRN, AGNP-C Adult-Gerontology Nurse Practitioner [facility]

## 2022-07-19 ENCOUNTER — Ambulatory Visit (INDEPENDENT_AMBULATORY_CARE_PROVIDER_SITE_OTHER): Payer: Medicare Other | Admitting: Gastroenterology

## 2022-07-19 ENCOUNTER — Encounter (INDEPENDENT_AMBULATORY_CARE_PROVIDER_SITE_OTHER): Payer: Self-pay | Admitting: Gastroenterology

## 2022-07-19 ENCOUNTER — Telehealth (INDEPENDENT_AMBULATORY_CARE_PROVIDER_SITE_OTHER): Payer: Self-pay | Admitting: *Deleted

## 2022-07-19 VITALS — BP 133/76 | HR 87 | Temp 98.1°F | Ht 65.5 in | Wt 184.6 lb

## 2022-07-19 DIAGNOSIS — K921 Melena: Secondary | ICD-10-CM | POA: Diagnosis not present

## 2022-07-19 DIAGNOSIS — D508 Other iron deficiency anemias: Secondary | ICD-10-CM

## 2022-07-19 DIAGNOSIS — K219 Gastro-esophageal reflux disease without esophagitis: Secondary | ICD-10-CM

## 2022-07-19 LAB — HEMOGLOBIN AND HEMATOCRIT, BLOOD
HCT: 20 % — ABNORMAL LOW (ref 35.0–45.0)
Hemoglobin: 5.9 g/dL — CL (ref 11.7–15.5)

## 2022-07-19 NOTE — Telephone Encounter (Signed)
Called pt. Scheduled for EGD with Dr. Jenetta Downer, room 3 on 3/12 at 1:45pm. Aware will call back with pre-op appt. Instructions discussed. Aware to stop iron 7 days prior.   PA approved via [employer], auth# [id number], DOS: 08/02/22-10/31/22

## 2022-07-19 NOTE — Patient Instructions (Signed)
We will check blood counts today If you have continued black stools/rectal bleeding, worsening shortness of breath, dizziness or pass out, please make me aware immediately Please increase nexium to '20mg'$  to twice a day We will get you scheduled for Upper endoscopy for further evaluation of your symptoms   F/u 2 months

## 2022-07-19 NOTE — Progress Notes (Signed)
Referring Provider: Glenda Chroman, MD Primary Care Physician:  Glenda Chroman, MD Primary GI Physician: Jenetta Downer   Chief Complaint  Patient presents with   dark stools    Noticed some dark stools last Saturday and Sunday. Noticed more sob than normal. Also not having any appetite.    HPI:   Valerie Griffin is a 89 y.o. female with past medical history of  anemia, arthritis, constipation, dysphagia, esophageal stenosis, GERD.    Patient presenting today for dark stools.  Last seen June 203, at that time doing well with nexium '20mg'$  daily, occasional heartburn with certain foods, having some dysphagia with raw foods, cutting meats small and felt like dysphagia was well managed.   Recommended to continue nexium '20mg'$  daily, reflux and chewing precautions, continue colace for constipation.   Hgb in sept 2023 was 7.1, she was transfused 2 units PRBCs   Present:  Patient reports she had melena on Saturday and again on Sunday. She states that she had routine blood work in September and was found to be anemic. She denied SOB or dizziness at that time but had fatigue. Has had worsening SOB over the past 2-3 weeks. After the 2 blood transfusions, she had iron infusions. She is not following with hematology anymore as PCP is managing her anemia. She denies any abdominal pain. No nausea or vomiting. Denies rectal bleeding. She has lost maybe a couple of pounds but no signficant amount. Appetite is good. She has some epigastric soreness and more heartburn recently. Feels that nexium daily does okay, she has had to take more tums, reports taking 3, most nights of the week for the past month. No frequent NSAIDs . She is taking iron pill daily.   Last colonoscopy:2011, 1 small adenoma Last EGD: 08/21/20- Normal hypopharynx. - Normal proximal esophagus and mid esophagus. - LA Grade B reflux esophagitis with no bleeding. - Benign-appearing esophageal stenosis. Dilated. - 5 cm hiatal hernia. - Normal  stomach. - Normal duodenal bulb and second portion of the duodenum. - No specimens collected.  Past Medical History:  Diagnosis Date   Anemia    Arthritis    Constipation    Dysphagia 08/16/2016   Esophageal stenosis    GERD (gastroesophageal reflux disease)     Past Surgical History:  Procedure Laterality Date   ABDOMINAL HYSTERECTOMY     APPENDECTOMY     BALLOON DILATION N/A 06/11/2020   Procedure: BALLOON DILATION;  Surgeon: Rogene Houston, MD;  [facility];  Service: Endoscopy;  Laterality: N/A;   broken arm and wrist with plate and scews rt wrist Right    CHOLECYSTECTOMY     COLONOSCOPY     complete hysterectomy'     fibroid tumors bleeding   ESOPHAGEAL DILATION N/A 08/22/2016   Procedure: ESOPHAGEAL DILATION;  Surgeon: Rogene Houston, MD;  [facility];  Service: Endoscopy;  Laterality: N/A;   ESOPHAGEAL DILATION N/A 09/14/2016   Procedure: ESOPHAGEAL DILATION;  Surgeon: Rogene Houston, MD;  [facility];  Service: Endoscopy;  Laterality: N/A;   ESOPHAGEAL DILATION N/A 07/26/2017   Procedure: ESOPHAGEAL DILATION;  Surgeon: Rogene Houston, MD;  [facility];  Service: Endoscopy;  Laterality: N/A;   ESOPHAGEAL DILATION N/A 08/31/2017   Procedure: ESOPHAGEAL DILATION;  Surgeon: Rogene Houston, MD;  [facility];  Service: Endoscopy;  Laterality: N/A;   ESOPHAGEAL DILATION N/A 10/26/2017   Procedure: ESOPHAGEAL DILATION;  Surgeon: Rogene Houston,  MD;  [facility];  Service: Endoscopy;  Laterality: N/A;   ESOPHAGEAL DILATION N/A 02/07/2018   Procedure: ESOPHAGEAL DILATION;  Surgeon: Rogene Houston, MD;  [facility];  Service: Endoscopy;  Laterality: N/A;   ESOPHAGEAL DILATION N/A 05/31/2019   Procedure: ESOPHAGEAL DILATION;  Surgeon: Rogene Houston, MD;  [facility];  Service: Endoscopy;  Laterality: N/A;   ESOPHAGEAL DILATION N/A 06/14/2019   Procedure: ESOPHAGEAL DILATION;  Surgeon:  Rogene Houston, MD;  [facility];  Service: Endoscopy;  Laterality: N/A;   ESOPHAGEAL DILATION N/A 07/04/2019   Procedure: ESOPHAGEAL DILATION;  Surgeon: Rogene Houston, MD;  [facility];  Service: Endoscopy;  Laterality: N/A;  730   ESOPHAGEAL DILATION N/A 08/01/2019   Procedure: ESOPHAGEAL DILATION;  Surgeon: Rogene Houston, MD;  [facility];  Service: Endoscopy;  Laterality: N/A;   ESOPHAGEAL DILATION N/A 05/14/2020   Procedure: ESOPHAGEAL DILATION;  Surgeon: Rogene Houston, MD;  [facility];  Service: Endoscopy;  Laterality: N/A;   ESOPHAGOGASTRODUODENOSCOPY N/A 08/22/2016   Procedure: ESOPHAGOGASTRODUODENOSCOPY (EGD);  Surgeon: Rogene Houston, MD;  [facility];  Service: Endoscopy;  Laterality: N/A;   ESOPHAGOGASTRODUODENOSCOPY N/A 09/14/2016   Procedure: ESOPHAGOGASTRODUODENOSCOPY (EGD);  Surgeon: Rogene Houston, MD;  [facility];  Service: Endoscopy;  Laterality: N/A;  1225   ESOPHAGOGASTRODUODENOSCOPY N/A 07/26/2017   Procedure: ESOPHAGOGASTRODUODENOSCOPY (EGD);  Surgeon: Rogene Houston, MD;  [facility];  Service: Endoscopy;  Laterality: N/A;  1:45   ESOPHAGOGASTRODUODENOSCOPY N/A 08/31/2017   Procedure: ESOPHAGOGASTRODUODENOSCOPY (EGD);  Surgeon: Rogene Houston, MD;  [facility];  Service: Endoscopy;  Laterality: N/A;   ESOPHAGOGASTRODUODENOSCOPY N/A 10/26/2017   Procedure: ESOPHAGOGASTRODUODENOSCOPY (EGD);  Surgeon: Rogene Houston, MD;  [facility];  Service: Endoscopy;  Laterality: N/A;  1115   ESOPHAGOGASTRODUODENOSCOPY N/A 02/07/2018   Procedure: ESOPHAGOGASTRODUODENOSCOPY (EGD);  Surgeon: Rogene Houston, MD;  [facility];  Service: Endoscopy;  Laterality: N/A;  830   ESOPHAGOGASTRODUODENOSCOPY N/A 07/04/2019   Procedure: ESOPHAGOGASTRODUODENOSCOPY (EGD);  Surgeon: Rogene Houston, MD;  [facility];  Service: Endoscopy;  Laterality: N/A;  730    ESOPHAGOGASTRODUODENOSCOPY N/A 08/01/2019   Procedure: ESOPHAGOGASTRODUODENOSCOPY (EGD);  Surgeon: Rogene Houston, MD;  [facility];  Service: Endoscopy;  Laterality: N/A;  125   ESOPHAGOGASTRODUODENOSCOPY N/A 05/14/2020   Procedure: ESOPHAGOGASTRODUODENOSCOPY (EGD);  Surgeon: Rogene Houston, MD;  [facility];  Service: Endoscopy;  Laterality: N/A;  10:30   ESOPHAGOGASTRODUODENOSCOPY N/A 06/11/2020   Procedure: ESOPHAGOGASTRODUODENOSCOPY (EGD);  Surgeon: Rogene Houston, MD;  [facility];  Service: Endoscopy;  Laterality: N/A;  7:30   ESOPHAGOGASTRODUODENOSCOPY (EGD) WITH PROPOFOL N/A 05/31/2019   Procedure: ESOPHAGOGASTRODUODENOSCOPY (EGD) WITH PROPOFOL;  Surgeon: Rogene Houston, MD;  [facility];  Service: Endoscopy;  Laterality: N/A;  2:35   ESOPHAGOGASTRODUODENOSCOPY (EGD) WITH PROPOFOL N/A 06/14/2019   Procedure: ESOPHAGOGASTRODUODENOSCOPY (EGD) WITH PROPOFOL;  Surgeon: Rogene Houston, MD;  [facility];  Service: Endoscopy;  Laterality: N/A;   FOREIGN BODY REMOVAL  05/31/2019   Procedure: FOREIGN BODY REMOVAL;  Surgeon: Rogene Houston, MD;  [facility];  Service: Endoscopy;;    Current Outpatient Medications  Medication Sig Dispense Refill   Ascorbic Acid (VITAMIN C) 1000 MG tablet Take 1,000 mg by mouth daily.     aspirin EC 81 MG tablet Take 1  tablet (81 mg total) by mouth every evening. 30 tablet 11   Cholecalciferol (VITAMIN D) 50 MCG (2000 UT) tablet Take 2,000 Units by mouth daily.     docusate sodium (COLACE) 100 MG capsule Take 100 mg by mouth at bedtime.     esomeprazole (NEXIUM) 20 MG capsule Take 1 capsule (20 mg total) by mouth 2 (two) times daily before a meal. (Patient taking differently: Take 20 mg by mouth daily before breakfast. Takes once daily. Gets OTC)     Ferrous Sulfate (IRON) 28 MG TABS Take 28 mg by mouth. One qam     gabapentin (NEURONTIN) 300 MG capsule Take 300 mg by mouth. Two capsules  tid     HYDROcodone-acetaminophen (NORCO/VICODIN) 5-325 MG tablet Take 1 tablet by mouth every 6 (six) hours as needed for moderate pain.     Multiple Vitamin (MULTIVITAMIN WITH MINERALS) TABS tablet Take 1 tablet by mouth daily. Centrum Silver     TURMERIC PO Take 1,000 mg by mouth daily.     No current facility-administered medications for this visit.    Allergies as of 07/19/2022 - Review Complete 07/19/2022  Allergen Reaction Noted   Penicillins Rash and Other (See Comments) 08/16/2016    Family History  Problem Relation Age of Onset   Heart Problems Mother    Alzheimer's disease Mother    Hypertension Mother    Prostate cancer Father    Heart attack Father    Heart Problems Sister     Social History   Socioeconomic History   Marital status: Widowed    Spouse name: Not on file   Number of children: Not on file   Years of education: Not on file   Highest education level: Not on file  Occupational History   Not on file  Tobacco Use   Smoking status: Never    Passive exposure: Current   Smokeless tobacco: Never  Vaping Use   Vaping Use: Never used  Substance and Sexual Activity   Alcohol use: No   Drug use: No   Sexual activity: Not on file  Other Topics Concern   Not on file  Social History Narrative   Not on file   Social Determinants of Health   Financial Resource Strain: Not on file  Food Insecurity: Not on file  Transportation Needs: Not on file  Physical Activity: Not on file  Stress: Not on file  Social Connections: Not on file    Review of systems General: negative for malaise, night sweats, fever, chills, weight loss Neck: Negative for lumps, goiter, pain and significant neck swelling Resp: Negative for cough, wheezing +SOB with exertion CV: Negative for chest pain, leg swelling, palpitations, orthopnea GI: denies hematochezia, nausea, vomiting, diarrhea, constipation, dysphagia, odyonophagia, early satiety or unintentional weight loss. +melena  +epigastric soreness  MSK: Negative for joint pain or swelling, back pain, and muscle pain. Derm: Negative for itching or rash Psych: Denies depression, anxiety, memory loss, confusion. No homicidal or suicidal ideation.  Heme: Negative for prolonged bleeding, bruising easily, and swollen nodes. Endocrine: Negative for cold or heat intolerance, polyuria, polydipsia and goiter. Neuro: negative for tremor, gait imbalance, syncope and seizures. The remainder of the review of systems is noncontributory.  Physical Exam: BP 133/76 (BP Location: Right Arm, Patient Position: Sitting, Cuff Size: Large)   Pulse 87   Temp 98.1 F (36.7 C) (Oral)   Ht 5' 5.5" (1.664 m)   Wt 184 lb 9.6 oz (83.7 kg)   BMI 30.25 kg/m  General:   Alert and oriented. No distress noted. Pleasant and cooperative.  Head:  Normocephalic and atraumatic. Eyes:  Conjuctiva clear without scleral icterus. Mouth:  Oral mucosa pink and moist. Good dentition. No lesions. Heart: Normal rate and rhythm, s1 and s2 heart sounds present.  Lungs: Clear lung sounds in all lobes. Respirations equal and unlabored. Abdomen:  +BS, soft,and non-distended. TTP of epigastric region. No rebound or guarding. No HSM or masses noted. Derm: No palmar erythema or jaundice Msk:  Symmetrical without gross deformities. Normal posture. Extremities:  Without edema. Neurologic:  Alert and  oriented x4 Psych:  Alert and cooperative. Normal mood and affect.  Invalid input(s): "6 MONTHS"   ASSESSMENT: Valerie Griffin is a 89 y.o. female presenting today for melena.   Melena noted x 2 over the weekend with any hematochezia. Notably history of IDA, previously followed with hematology but PCP recently managing. She did require blood and subsequent iron transfusions in September 2023 due to worsening anemia. Last hgb in September 2023 was 7.1 (prior to transfusions). She is maintained on PO iron. Notes more SOB with exertion recently. Given no recent blood  work, will check h&h to monitor for significant anemia that would require transfusion. Will increase nexium to '20mg'$  BID as she is having more GERD symptoms as well. Recommend proceeding with EGD for further evaluation of her melena as there is certainly concern for UGIB, cannot rule out PUD, Gastritis, duodenitis, AVMs. Indications, risks and benefits of procedure discussed in detail with patient. Patient verbalized understanding and is in agreement to proceed with EGD    PLAN:  H&H 2. Schedule EGD-ASA III ENDO 3 3. Increase Nexium to '20mg'$  BID  4. Pt to make me aware of continued black stools/rectal bleeding, worsening shortness of breath, dizziness or pass out  All questions were answered, patient verbalized understanding and is in agreement with plan as outlined above.   Follow Up: 2 months   Ladarrell Cornwall L. Alver Sorrow, MSN, APRN, AGNP-C Adult-Gerontology Nurse Practitioner [facility]  I have reviewed the note and agree with the APP's assessment as described in this progress note  [name], MD Gastroenterology and Hepatology [facility]

## 2022-07-20 ENCOUNTER — Observation Stay (HOSPITAL_COMMUNITY)
Admission: EM | Admit: 2022-07-20 | Discharge: 2022-07-23 | Disposition: A | Discharge status: Home / Self Care | Payer: Medicare Other | Attending: Family Medicine | Admitting: Family Medicine

## 2022-07-20 ENCOUNTER — Other Ambulatory Visit: Payer: Self-pay

## 2022-07-20 ENCOUNTER — Encounter (HOSPITAL_COMMUNITY): Payer: Self-pay

## 2022-07-20 ENCOUNTER — Telehealth (INDEPENDENT_AMBULATORY_CARE_PROVIDER_SITE_OTHER): Payer: Self-pay | Admitting: *Deleted

## 2022-07-20 DIAGNOSIS — D649 Anemia, unspecified: Secondary | ICD-10-CM | POA: Diagnosis present

## 2022-07-20 DIAGNOSIS — K5731 Diverticulosis of large intestine without perforation or abscess with bleeding: Secondary | ICD-10-CM | POA: Diagnosis not present

## 2022-07-20 DIAGNOSIS — Z7722 Contact with and (suspected) exposure to environmental tobacco smoke (acute) (chronic): Secondary | ICD-10-CM | POA: Diagnosis not present

## 2022-07-20 DIAGNOSIS — N1831 Chronic kidney disease, stage 3a: Secondary | ICD-10-CM | POA: Insufficient documentation

## 2022-07-20 DIAGNOSIS — K449 Diaphragmatic hernia without obstruction or gangrene: Secondary | ICD-10-CM | POA: Diagnosis not present

## 2022-07-20 DIAGNOSIS — D122 Benign neoplasm of ascending colon: Secondary | ICD-10-CM | POA: Diagnosis not present

## 2022-07-20 DIAGNOSIS — D124 Benign neoplasm of descending colon: Secondary | ICD-10-CM | POA: Diagnosis not present

## 2022-07-20 DIAGNOSIS — W448XXA Other foreign body entering into or through a natural orifice, initial encounter: Secondary | ICD-10-CM | POA: Insufficient documentation

## 2022-07-20 DIAGNOSIS — K921 Melena: Secondary | ICD-10-CM | POA: Diagnosis not present

## 2022-07-20 DIAGNOSIS — K922 Gastrointestinal hemorrhage, unspecified: Secondary | ICD-10-CM

## 2022-07-20 DIAGNOSIS — Z7982 Long term (current) use of aspirin: Secondary | ICD-10-CM | POA: Diagnosis not present

## 2022-07-20 DIAGNOSIS — Z79899 Other long term (current) drug therapy: Secondary | ICD-10-CM | POA: Diagnosis not present

## 2022-07-20 DIAGNOSIS — D62 Acute posthemorrhagic anemia: Secondary | ICD-10-CM | POA: Diagnosis not present

## 2022-07-20 DIAGNOSIS — D123 Benign neoplasm of transverse colon: Secondary | ICD-10-CM | POA: Diagnosis not present

## 2022-07-20 DIAGNOSIS — T184XXA Foreign body in colon, initial encounter: Secondary | ICD-10-CM | POA: Diagnosis not present

## 2022-07-20 LAB — CBC WITH DIFFERENTIAL/PLATELET
Abs Immature Granulocytes: 0.03 10*3/uL (ref 0.00–0.07)
Basophils Absolute: 0 10*3/uL (ref 0.0–0.1)
Basophils Relative: 1 %
Eosinophils Absolute: 0.2 10*3/uL (ref 0.0–0.5)
Eosinophils Relative: 3 %
HCT: 18.7 % — ABNORMAL LOW (ref 36.0–46.0)
Hemoglobin: 5.2 g/dL — CL (ref 12.0–15.0)
Immature Granulocytes: 1 %
Lymphocytes Relative: 15 %
Lymphs Abs: 0.9 10*3/uL (ref 0.7–4.0)
MCH: 23.3 pg — ABNORMAL LOW (ref 26.0–34.0)
MCHC: 27.8 g/dL — ABNORMAL LOW (ref 30.0–36.0)
MCV: 83.9 fL (ref 80.0–100.0)
Monocytes Absolute: 0.5 10*3/uL (ref 0.1–1.0)
Monocytes Relative: 8 %
Neutro Abs: 4.5 10*3/uL (ref 1.7–7.7)
Neutrophils Relative %: 72 %
Platelets: 265 10*3/uL (ref 150–400)
RBC: 2.23 MIL/uL — ABNORMAL LOW (ref 3.87–5.11)
RDW: 19.4 % — ABNORMAL HIGH (ref 11.5–15.5)
WBC: 6.2 10*3/uL (ref 4.0–10.5)
nRBC: 0 % (ref 0.0–0.2)

## 2022-07-20 LAB — BASIC METABOLIC PANEL
Anion gap: 7 (ref 5–15)
BUN: 19 mg/dL (ref 8–23)
CO2: 22 mmol/L (ref 22–32)
Calcium: 8.2 mg/dL — ABNORMAL LOW (ref 8.9–10.3)
Chloride: 108 mmol/L (ref 98–111)
Creatinine, Ser: 1.17 mg/dL — ABNORMAL HIGH (ref 0.44–1.00)
GFR, Estimated: 45 mL/min — ABNORMAL LOW (ref >60)
Glucose, Bld: 112 mg/dL — ABNORMAL HIGH (ref 70–99)
Potassium: 4.3 mmol/L (ref 3.5–5.1)
Sodium: 137 mmol/L (ref 135–145)

## 2022-07-20 LAB — PREPARE RBC (CROSSMATCH)

## 2022-07-20 LAB — ABO/RH: ABO/RH(D): A POS

## 2022-07-20 MED ORDER — ACETAMINOPHEN 650 MG RE SUPP: 650.0000 mg | Freq: Four times a day (QID) | RECTAL | Status: DC | PRN

## 2022-07-20 MED ORDER — SODIUM CHLORIDE 0.9% FLUSH
3.0000 mL | Freq: Two times a day (BID) | INTRAVENOUS | Status: DC
  Administered 2022-07-20 – 2022-07-23 (×4): 3 mL via INTRAVENOUS

## 2022-07-20 MED ORDER — PANTOPRAZOLE SODIUM 40 MG IV SOLR
40.0000 mg | Freq: Two times a day (BID) | INTRAVENOUS | Status: DC
  Administered 2022-07-20 – 2022-07-23 (×6): 40 mg via INTRAVENOUS
  Filled 2022-07-20 (×6): qty 10

## 2022-07-20 MED ORDER — SODIUM CHLORIDE 0.9 % IV SOLN
250.0000 mL | INTRAVENOUS | Status: DC | PRN
  Administered 2022-07-20: 250 mL via INTRAVENOUS

## 2022-07-20 MED ORDER — PANTOPRAZOLE SODIUM 40 MG IV SOLR: 40.0000 mg | Freq: Two times a day (BID) | INTRAVENOUS | Status: DC

## 2022-07-20 MED ORDER — HYDROCODONE-ACETAMINOPHEN 5-325 MG PO TABS
1.0000 | ORAL_TABLET | Freq: Four times a day (QID) | ORAL | Status: DC | PRN
  Administered 2022-07-21 – 2022-07-22 (×2): 1 via ORAL
  Filled 2022-07-20 (×2): qty 1

## 2022-07-20 MED ORDER — ACETAMINOPHEN 325 MG PO TABS: 650.0000 mg | ORAL_TABLET | Freq: Four times a day (QID) | ORAL | Status: DC | PRN

## 2022-07-20 MED ORDER — SODIUM CHLORIDE 0.9% FLUSH
3.0000 mL | Freq: Two times a day (BID) | INTRAVENOUS | Status: DC
  Administered 2022-07-20 – 2022-07-22 (×5): 3 mL via INTRAVENOUS

## 2022-07-20 MED ORDER — SODIUM CHLORIDE 0.9% FLUSH: 3.0000 mL | INTRAVENOUS | Status: DC | PRN

## 2022-07-20 MED ORDER — PANTOPRAZOLE SODIUM 40 MG IV SOLR
40.0000 mg | Freq: Once | INTRAVENOUS | Status: AC
  Administered 2022-07-20: 40 mg via INTRAVENOUS
  Filled 2022-07-20: qty 10

## 2022-07-20 MED ORDER — GABAPENTIN 300 MG PO CAPS
300.0000 mg | ORAL_CAPSULE | Freq: Every day | ORAL | Status: DC
  Administered 2022-07-20 – 2022-07-22 (×3): 300 mg via ORAL
  Filled 2022-07-20 (×3): qty 1

## 2022-07-20 MED ORDER — SODIUM CHLORIDE 0.9% IV SOLUTION: Freq: Once | INTRAVENOUS | Status: DC

## 2022-07-20 NOTE — ED Notes (Addendum)
ED TO INPATIENT HANDOFF REPORT  ED Nurse Name and Phone #: [phone]   S Name/Age/Gender Valerie Griffin 89 y.o. female Room/Bed: APA15/APA15  Code Status   Code Status: Not on file  Home/SNF/Other Home Patient oriented to: self, place, time, and situation Is this baseline? Yes   Triage Complete: Triage complete  Chief Complaint Anemia [D64.9]  Triage Note Pt presents to ED from home home C/O generalized weakness. Pt reports sent by GI d/t abnormal labs, states hgb 5.    Allergies Allergies  Allergen Reactions   Penicillins Rash and Other (See Comments)    40 years ago, caused rash and tachycardia Has patient had a PCN reaction causing immediate rash, facial/tongue/throat swelling, SOB or lightheadedness with hypotension: No Has patient had a PCN reaction causing severe rash involving mucus membranes or skin necrosis: No Has patient had a PCN reaction that required hospitalization: No Has patient had a PCN reaction occurring within the last 10 years: No If all of the above answers are "NO", then may proceed with Cephalosporin use.     Level of Care/Admitting Diagnosis ED Disposition     ED Disposition  Admit   Condition  --   [place]: [facility] [id number]  Level of Care: Telemetry [5]  Covid Evaluation: Asymptomatic - no recent exposure (last 10 days) testing not required  Diagnosis: Anemia [id number]  Admitting Physician: [place], [place]  Attending Physician: Samuella Cota [[id number]]          B Medical/Surgery History Past Medical History:  Diagnosis Date   Anemia    Arthritis    Constipation    Dysphagia 08/16/2016   Esophageal stenosis    GERD (gastroesophageal reflux disease)    Past Surgical History:  Procedure Laterality Date   ABDOMINAL HYSTERECTOMY     APPENDECTOMY     BALLOON DILATION N/A 06/11/2020   Procedure: BALLOON DILATION;  Surgeon: Rogene Houston, MD;  [facility];  Service:  Endoscopy;  Laterality: N/A;   broken arm and wrist with plate and scews rt wrist Right    CHOLECYSTECTOMY     COLONOSCOPY     complete hysterectomy'     fibroid tumors bleeding   ESOPHAGEAL DILATION N/A 08/22/2016   Procedure: ESOPHAGEAL DILATION;  Surgeon: Rogene Houston, MD;  [facility];  Service: Endoscopy;  Laterality: N/A;   ESOPHAGEAL DILATION N/A 09/14/2016   Procedure: ESOPHAGEAL DILATION;  Surgeon: Rogene Houston, MD;  [facility];  Service: Endoscopy;  Laterality: N/A;   ESOPHAGEAL DILATION N/A 07/26/2017   Procedure: ESOPHAGEAL DILATION;  Surgeon: Rogene Houston, MD;  [facility];  Service: Endoscopy;  Laterality: N/A;   ESOPHAGEAL DILATION N/A 08/31/2017   Procedure: ESOPHAGEAL DILATION;  Surgeon: Rogene Houston, MD;  [facility];  Service: Endoscopy;  Laterality: N/A;   ESOPHAGEAL DILATION N/A 10/26/2017   Procedure: ESOPHAGEAL DILATION;  Surgeon: Rogene Houston, MD;  [facility];  Service: Endoscopy;  Laterality: N/A;   ESOPHAGEAL DILATION N/A 02/07/2018   Procedure: ESOPHAGEAL DILATION;  Surgeon: Rogene Houston, MD;  [facility];  Service: Endoscopy;  Laterality: N/A;   ESOPHAGEAL DILATION N/A 05/31/2019   Procedure: ESOPHAGEAL DILATION;  Surgeon: Rogene Houston, MD;  [facility];  Service: Endoscopy;  Laterality: N/A;   ESOPHAGEAL DILATION N/A 06/14/2019   Procedure: ESOPHAGEAL DILATION;  Surgeon: Rogene Houston, MD;  [facility];  Service: Endoscopy;  Laterality: N/A;   ESOPHAGEAL DILATION N/A 07/04/2019   Procedure: ESOPHAGEAL DILATION;  Surgeon: Rogene Houston, MD;  [facility];  Service: Endoscopy;  Laterality: N/A;  730   ESOPHAGEAL DILATION N/A 08/01/2019   Procedure: ESOPHAGEAL DILATION;  Surgeon: Rogene Houston, MD;  [facility];  Service: Endoscopy;  Laterality: N/A;   ESOPHAGEAL DILATION N/A 05/14/2020   Procedure: ESOPHAGEAL DILATION;   Surgeon: Rogene Houston, MD;  [facility];  Service: Endoscopy;  Laterality: N/A;   ESOPHAGOGASTRODUODENOSCOPY N/A 08/22/2016   Procedure: ESOPHAGOGASTRODUODENOSCOPY (EGD);  Surgeon: Rogene Houston, MD;  [facility];  Service: Endoscopy;  Laterality: N/A;   ESOPHAGOGASTRODUODENOSCOPY N/A 09/14/2016   Procedure: ESOPHAGOGASTRODUODENOSCOPY (EGD);  Surgeon: Rogene Houston, MD;  [facility];  Service: Endoscopy;  Laterality: N/A;  1225   ESOPHAGOGASTRODUODENOSCOPY N/A 07/26/2017   Procedure: ESOPHAGOGASTRODUODENOSCOPY (EGD);  Surgeon: Rogene Houston, MD;  [facility];  Service: Endoscopy;  Laterality: N/A;  1:45   ESOPHAGOGASTRODUODENOSCOPY N/A 08/31/2017   Procedure: ESOPHAGOGASTRODUODENOSCOPY (EGD);  Surgeon: Rogene Houston, MD;  [facility];  Service: Endoscopy;  Laterality: N/A;   ESOPHAGOGASTRODUODENOSCOPY N/A 10/26/2017   Procedure: ESOPHAGOGASTRODUODENOSCOPY (EGD);  Surgeon: Rogene Houston, MD;  [facility];  Service: Endoscopy;  Laterality: N/A;  1115   ESOPHAGOGASTRODUODENOSCOPY N/A 02/07/2018   Procedure: ESOPHAGOGASTRODUODENOSCOPY (EGD);  Surgeon: Rogene Houston, MD;  [facility];  Service: Endoscopy;  Laterality: N/A;  830   ESOPHAGOGASTRODUODENOSCOPY N/A 07/04/2019   Procedure: ESOPHAGOGASTRODUODENOSCOPY (EGD);  Surgeon: Rogene Houston, MD;  [facility];  Service: Endoscopy;  Laterality: N/A;  730   ESOPHAGOGASTRODUODENOSCOPY N/A 08/01/2019   Procedure: ESOPHAGOGASTRODUODENOSCOPY (EGD);  Surgeon: Rogene Houston, MD;  [facility];  Service: Endoscopy;  Laterality: N/A;  125   ESOPHAGOGASTRODUODENOSCOPY N/A 05/14/2020   Procedure: ESOPHAGOGASTRODUODENOSCOPY (EGD);  Surgeon: Rogene Houston, MD;  [facility];  Service: Endoscopy;  Laterality: N/A;  10:30   ESOPHAGOGASTRODUODENOSCOPY N/A 06/11/2020   Procedure: ESOPHAGOGASTRODUODENOSCOPY (EGD);  Surgeon: Rogene Houston, MD;  [facility];  Service: Endoscopy;  Laterality: N/A;  7:30   ESOPHAGOGASTRODUODENOSCOPY (EGD) WITH PROPOFOL N/A 05/31/2019   Procedure: ESOPHAGOGASTRODUODENOSCOPY (EGD) WITH PROPOFOL;  Surgeon: Rogene Houston, MD;  [facility];  Service: Endoscopy;  Laterality: N/A;  2:35   ESOPHAGOGASTRODUODENOSCOPY (EGD) WITH PROPOFOL N/A 06/14/2019   Procedure: ESOPHAGOGASTRODUODENOSCOPY (EGD) WITH PROPOFOL;  Surgeon: Rogene Houston, MD;  [facility];  Service: Endoscopy;  Laterality: N/A;   FOREIGN BODY REMOVAL  05/31/2019   Procedure: FOREIGN BODY REMOVAL;  Surgeon: Rogene Houston, MD;  [facility];  Service: Endoscopy;;     A IV Location/Drains/Wounds Patient Lines/Drains/Airways Status     Active Line/Drains/Airways     Name Placement date Placement time Site Days   Peripheral IV 07/20/22 20 G Anterior;Right;Distal Forearm 07/20/22  1011  Forearm  less than 1            Intake/Output Last 24 hours No intake or output data in the 24 hours ending 07/20/22 1254  Labs/Imaging Results for orders placed or performed during the hospital encounter of 07/20/22 (from the past 48 hour(s))  CBC with Differential     Status: Abnormal   Collection Time: 07/20/22 10:20 AM  Result Value Ref Range   WBC 6.2 4.0 - 10.5 K/uL   RBC 2.23 (L) 3.87 -  5.11 MIL/uL   Hemoglobin 5.2 (LL) 12.0 - 15.0 g/dL    Comment: This critical result has verified and been called to [place] by Sharlot Gowda on 02 28 2024 at 1109, and has been read back.    HCT 18.7 (L) 36.0 - 46.0 %   MCV 83.9 80.0 - 100.0 fL   MCH 23.3 (L) 26.0 - 34.0 pg   MCHC 27.8 (L) 30.0 - 36.0 g/dL   RDW 19.4 (H) 11.5 - 15.5 %   Platelets 265 150 - 400 K/uL   nRBC 0.0 0.0 - 0.2 %   Neutrophils Relative % 72 %   Neutro Abs 4.5 1.7 - 7.7 K/uL   Lymphocytes Relative 15 %   Lymphs Abs 0.9 0.7 - 4.0 K/uL   Monocytes Relative 8 %   Monocytes Absolute 0.5 0.1 - 1.0 K/uL   Eosinophils Relative  3 %   Eosinophils Absolute 0.2 0.0 - 0.5 K/uL   Basophils Relative 1 %   Basophils Absolute 0.0 0.0 - 0.1 K/uL   WBC Morphology MORPHOLOGY UNREMARKABLE    RBC Morphology POLYCHROMASIA    Smear Review MORPHOLOGY UNREMARKABLE    Immature Granulocytes 1 %   Abs Immature Granulocytes 0.03 0.00 - 0.07 K/uL   Polychromasia PRESENT    Target Cells PRESENT     Comment: Performed at [facility], [address] [place], [place] [id number]  Basic metabolic panel     Status: Abnormal   Collection Time: 07/20/22 10:20 AM  Result Value Ref Range   Sodium 137 135 - 145 mmol/L   Potassium 4.3 3.5 - 5.1 mmol/L   Chloride 108 98 - 111 mmol/L   CO2 22 22 - 32 mmol/L   Glucose, Bld 112 (H) 70 - 99 mg/dL    Comment: Glucose reference range applies only to samples taken after fasting for at least 8 hours.   BUN 19 8 - 23 mg/dL   Creatinine, Ser 1.17 (H) 0.44 - 1.00 mg/dL   Calcium 8.2 (L) 8.9 - 10.3 mg/dL   GFR, Estimated 45 (L) >60 mL/min    Comment: (NOTE) Calculated using the CKD-EPI Creatinine Equation (2021)    Anion gap 7 5 - 15    Comment: Performed at [facility], [address], [place], [place] [address]  Prepare RBC (crossmatch)     Status: None   Collection Time: 07/20/22 10:20 AM  Result Value Ref Range   Order Confirmation      ORDER PROCESSED BY BLOOD BANK Performed at [facility], [address] [place], [place] [address]   Type and screen [facility]     Status: None   Collection Time: 07/20/22 10:20 AM  Result Value Ref Range   ABO/RH(D) A POS    Antibody Screen POS    Sample Expiration      07/23/2022,2359 Performed at [facility], [address] [place], [place] [address]    No results found.  Pending Labs Unresulted Labs (From admission, onward)     Start     Ordered   07/20/22 1145  ABO/Rh  Once,   STAT        07/20/22 1145            Vitals/Pain Today's Vitals   07/20/22 0946 07/20/22 0947 07/20/22 1130 07/20/22 1230  BP: (!) 146/50  (!)  148/49 (!) 147/58  Pulse: 74  80 78  Resp: 16  19 (!) 22  Temp: 98.3 F (36.8 C)  TempSrc: Oral     SpO2: 96%  95% 93%  Weight:  185 lb (83.9 kg)    Height:  '5\' 5"'$  (1.651 m)    PainSc:  0-No pain      Isolation Precautions No active isolations  Medications Medications  0.9 %  sodium chloride infusion (Manually program via Guardrails IV Fluids) (has no administration in time range)  pantoprazole (PROTONIX) injection 40 mg (40 mg Intravenous Given 07/20/22 1250)    Mobility Walker with assistance     Focused Assessments Hx bleeding ulcers had one episode of black stool hx of blood transfusions has antibiodies in blood so blood has to come from [facility].  Consent signed in chart.   R Recommendations: See Admitting Provider Note  Report given to:   Additional Notes: [id number]

## 2022-07-20 NOTE — ED Triage Notes (Signed)
Pt presents to ED from home home C/O generalized weakness. Pt reports sent by GI d/t abnormal labs, states hgb 5.

## 2022-07-20 NOTE — ED Provider Notes (Signed)
[facility] Provider Note   CSN: [id number] Arrival date & time: 07/20/22  [id number]     History  Chief Complaint  Patient presents with   Abnormal Lab    JAHAYRA FREITAS is a 89 y.o. female.  Patient reports she was seen in the GI doctor's office yesterday.  Patient was called today and told to come to the emergency department because her hemoglobin was low and she needed a blood transfusion.  Patient is scheduled for an endoscopy March 12 with Dr. Jenetta Downer.  Reports that she has required transfusions in the past secondary to anemia due to upper GI bleeding.  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  Patient reports that she has a history of iron deficiency anemia in the past she has had transfusions and iron infusions.  Patient complains of feeling very weak and tired.  Patient reports she did have some dark stool.  Patient was Hemoccult positive yesterday.  The history is provided by the patient. No language interpreter was used.  Abnormal Lab Time since result:  Today      Home Medications Prior to Admission medications   Medication Sig Start Date End Date Taking? Authorizing Provider  Ascorbic Acid (VITAMIN C) 1000 MG tablet Take 1,000 mg by mouth daily.    [provider]  aspirin EC 81 MG tablet Take 1 tablet (81 mg total) by mouth every evening. 06/14/20   Rehman, Mechele Dawley, MD  Cholecalciferol (VITAMIN D) 50 MCG (2000 UT) tablet Take 2,000 Units by mouth daily.    [provider]  docusate sodium (COLACE) 100 MG capsule Take 100 mg by mouth at bedtime.    [provider]  esomeprazole (NEXIUM) 20 MG capsule Take 1 capsule (20 mg total) by mouth 2 (two) times daily before a meal. Patient taking differently: Take 20 mg by mouth daily before breakfast. Takes once daily. Gets OTC 02/07/18   Rehman, Mechele Dawley, MD  Ferrous Sulfate (IRON)  28 MG TABS Take 28 mg by mouth. One qam    [provider]  gabapentin (NEURONTIN) 300 MG capsule Take 300 mg by mouth. Two capsules tid 10/19/21   [provider]  HYDROcodone-acetaminophen (NORCO/VICODIN) 5-325 MG tablet Take 1 tablet by mouth every 6 (six) hours as needed for moderate pain.    [provider]  Multiple Vitamin (MULTIVITAMIN WITH MINERALS) TABS tablet Take 1 tablet by mouth daily. Centrum Silver    [provider]  TURMERIC PO Take 1,000 mg by mouth daily.    [provider]      Allergies    Penicillins    Review of Systems   Review of Systems  Gastrointestinal:  Positive for nausea.  All other systems reviewed and are negative.   Physical Exam Updated Vital Signs BP (!) 148/49   Pulse 80   Temp 98.3 F (36.8 C) (Oral)   Resp 19   Ht '5\' 5"'$  (1.651 m)   Wt 83.9 kg   SpO2 95%   BMI 30.79 kg/m  Physical Exam Vitals and nursing note reviewed.  Constitutional:      Appearance: She is well-developed.  HENT:     Head: Normocephalic.     Mouth/Throat:     Mouth: Mucous membranes are moist.  Cardiovascular:     Rate and Rhythm: Normal rate and regular rhythm.  Pulmonary:  Effort: Pulmonary effort is normal.  Abdominal:     General: Abdomen is flat. There is no distension.  Musculoskeletal:        General: Normal range of motion.     Cervical back: Normal range of motion.  Skin:    General: Skin is warm.  Neurological:     General: No focal deficit present.     Mental Status: She is alert and oriented to person, place, and time.  Psychiatric:        Mood and Affect: Mood normal.     ED Results / Procedures / Treatments   Labs (all labs ordered are listed, but only abnormal results are displayed) Labs Reviewed  CBC WITH DIFFERENTIAL/PLATELET - Abnormal; Notable for the following components:      Result Value   RBC 2.23 (*)    Hemoglobin 5.2 (*)    HCT 18.7 (*)    MCH 23.3 (*)    MCHC 27.8 (*)     RDW 19.4 (*)    All other components within normal limits  BASIC METABOLIC PANEL - Abnormal; Notable for the following components:   Glucose, Bld 112 (*)    Creatinine, Ser 1.17 (*)    Calcium 8.2 (*)    GFR, Estimated 45 (*)    All other components within normal limits  PREPARE RBC (CROSSMATCH)  TYPE AND SCREEN  ABO/RH    EKG None  Radiology No results found.  Procedures Procedures    Medications Ordered in ED Medications  0.9 %  sodium chloride infusion (Manually program via Guardrails IV Fluids) (has no administration in time range)    ED Course/ Medical Decision Making/ A&P                             Medical Decision Making Patient complains of weakness and fatigue.  Patient was called and told to come to the emergency department because her hemoglobin is low  Amount and/or Complexity of Data Reviewed Independent Historian:     Details: Patient is here with her son who is supportive External Data Reviewed: notes.    Details: Notes reviewed Labs: ordered. Decision-making details documented in ED Course.    Details: Abs ordered reviewed and interpreted patient's hemoglobin is 5.2 Discussion of management or test interpretation with external provider(s): Discussed patient with Dr. Gala Romney who is on-call for Dr. Eula Listen.  He advised patient should be admitted to medicine and have blood transfusion.  He advised giving her a PPI.  He does not think patient needs acute intervention from GI. Hospitalist consulted for admission  Risk Prescription drug management.           Final Clinical Impression(s) / ED Diagnoses Final diagnoses:  Symptomatic anemia  Gastrointestinal hemorrhage, unspecified gastrointestinal hemorrhage type    Rx / DC Orders ED Discharge Orders     None         Sidney Ace 07/20/22 1221    Margette Fast, MD 07/21/22 2358

## 2022-07-20 NOTE — Addendum Note (Signed)
Addended by: Harvel Quale on: 07/20/2022 03:23 PM   Modules accepted: Level of Service

## 2022-07-20 NOTE — H&P (Signed)
History and Physical    Patient: Valerie Griffin [id number] DOB: 1934-03-12 DOA: 07/20/2022 DOS: the patient was seen and examined on 07/20/2022 PCP: Glenda Chroman, MD  Patient coming from: Home  Chief Complaint:  Chief Complaint  Patient presents with   Abnormal Lab   HPI:  89 year old woman PMH including dysphagia, GERD presented to the emergency department with history of dark stools.  Admitted for profound anemia and GI consultation.  4 days ago the patient had several dark stools.  None since Sunday the 25th.  She has been feeling weak for the last couple of weeks and tired.  Ambulates with a walker, no recent falls.  Also a lot of stress in her life.  Seen in the GI office yesterday and labs were drawn with plans for outpatient EGD given her history and concern for upper GI bleed.  Hemoglobin resulted less than 6 and so she was sent to the emergency department where was confirmed.  Plans were made for transfusion, delayed by antibodies, GI consultation.  Review of Systems: As mentioned in the history of present illness. Other systems reviewed and are negative. Past Medical History:  Diagnosis Date   Anemia    Arthritis    Constipation    Dysphagia 08/16/2016   Esophageal stenosis    GERD (gastroesophageal reflux disease)    Past Surgical History:  Procedure Laterality Date   ABDOMINAL HYSTERECTOMY     APPENDECTOMY     BALLOON DILATION N/A 06/11/2020   Procedure: BALLOON DILATION;  Surgeon: Rogene Houston, MD;  [facility];  Service: Endoscopy;  Laterality: N/A;   broken arm and wrist with plate and scews rt wrist Right    CHOLECYSTECTOMY     COLONOSCOPY     complete hysterectomy'     fibroid tumors bleeding   ESOPHAGEAL DILATION N/A 08/22/2016   Procedure: ESOPHAGEAL DILATION;  Surgeon: Rogene Houston, MD;  [facility];  Service: Endoscopy;  Laterality: N/A;   ESOPHAGEAL DILATION N/A 09/14/2016   Procedure: ESOPHAGEAL DILATION;  Surgeon:  Rogene Houston, MD;  [facility];  Service: Endoscopy;  Laterality: N/A;   ESOPHAGEAL DILATION N/A 07/26/2017   Procedure: ESOPHAGEAL DILATION;  Surgeon: Rogene Houston, MD;  [facility];  Service: Endoscopy;  Laterality: N/A;   ESOPHAGEAL DILATION N/A 08/31/2017   Procedure: ESOPHAGEAL DILATION;  Surgeon: Rogene Houston, MD;  [facility];  Service: Endoscopy;  Laterality: N/A;   ESOPHAGEAL DILATION N/A 10/26/2017   Procedure: ESOPHAGEAL DILATION;  Surgeon: Rogene Houston, MD;  [facility];  Service: Endoscopy;  Laterality: N/A;   ESOPHAGEAL DILATION N/A 02/07/2018   Procedure: ESOPHAGEAL DILATION;  Surgeon: Rogene Houston, MD;  [facility];  Service: Endoscopy;  Laterality: N/A;   ESOPHAGEAL DILATION N/A 05/31/2019   Procedure: ESOPHAGEAL DILATION;  Surgeon: Rogene Houston, MD;  [facility];  Service: Endoscopy;  Laterality: N/A;   ESOPHAGEAL DILATION N/A 06/14/2019   Procedure: ESOPHAGEAL DILATION;  Surgeon: Rogene Houston, MD;  [facility];  Service: Endoscopy;  Laterality: N/A;   ESOPHAGEAL DILATION N/A 07/04/2019   Procedure: ESOPHAGEAL DILATION;  Surgeon: Rogene Houston, MD;  [facility];  Service: Endoscopy;  Laterality: N/A;  730   ESOPHAGEAL DILATION N/A 08/01/2019   Procedure: ESOPHAGEAL DILATION;  Surgeon: Rogene Houston, MD;  [facility];  Service: Endoscopy;  Laterality: N/A;   ESOPHAGEAL DILATION N/A  05/14/2020   Procedure: ESOPHAGEAL DILATION;  Surgeon: Rogene Houston, MD;  [facility];  Service: Endoscopy;  Laterality: N/A;   ESOPHAGOGASTRODUODENOSCOPY N/A 08/22/2016   Procedure: ESOPHAGOGASTRODUODENOSCOPY (EGD);  Surgeon: Rogene Houston, MD;  [facility];  Service: Endoscopy;  Laterality: N/A;   ESOPHAGOGASTRODUODENOSCOPY N/A 09/14/2016   Procedure: ESOPHAGOGASTRODUODENOSCOPY (EGD);  Surgeon: Rogene Houston, MD;  [facility];   Service: Endoscopy;  Laterality: N/A;  1225   ESOPHAGOGASTRODUODENOSCOPY N/A 07/26/2017   Procedure: ESOPHAGOGASTRODUODENOSCOPY (EGD);  Surgeon: Rogene Houston, MD;  [facility];  Service: Endoscopy;  Laterality: N/A;  1:45   ESOPHAGOGASTRODUODENOSCOPY N/A 08/31/2017   Procedure: ESOPHAGOGASTRODUODENOSCOPY (EGD);  Surgeon: Rogene Houston, MD;  [facility];  Service: Endoscopy;  Laterality: N/A;   ESOPHAGOGASTRODUODENOSCOPY N/A 10/26/2017   Procedure: ESOPHAGOGASTRODUODENOSCOPY (EGD);  Surgeon: Rogene Houston, MD;  [facility];  Service: Endoscopy;  Laterality: N/A;  1115   ESOPHAGOGASTRODUODENOSCOPY N/A 02/07/2018   Procedure: ESOPHAGOGASTRODUODENOSCOPY (EGD);  Surgeon: Rogene Houston, MD;  [facility];  Service: Endoscopy;  Laterality: N/A;  830   ESOPHAGOGASTRODUODENOSCOPY N/A 07/04/2019   Procedure: ESOPHAGOGASTRODUODENOSCOPY (EGD);  Surgeon: Rogene Houston, MD;  [facility];  Service: Endoscopy;  Laterality: N/A;  730   ESOPHAGOGASTRODUODENOSCOPY N/A 08/01/2019   Procedure: ESOPHAGOGASTRODUODENOSCOPY (EGD);  Surgeon: Rogene Houston, MD;  [facility];  Service: Endoscopy;  Laterality: N/A;  125   ESOPHAGOGASTRODUODENOSCOPY N/A 05/14/2020   Procedure: ESOPHAGOGASTRODUODENOSCOPY (EGD);  Surgeon: Rogene Houston, MD;  [facility];  Service: Endoscopy;  Laterality: N/A;  10:30   ESOPHAGOGASTRODUODENOSCOPY N/A 06/11/2020   Procedure: ESOPHAGOGASTRODUODENOSCOPY (EGD);  Surgeon: Rogene Houston, MD;  [facility];  Service: Endoscopy;  Laterality: N/A;  7:30   ESOPHAGOGASTRODUODENOSCOPY (EGD) WITH PROPOFOL N/A 05/31/2019   Procedure: ESOPHAGOGASTRODUODENOSCOPY (EGD) WITH PROPOFOL;  Surgeon: Rogene Houston, MD;  [facility];  Service: Endoscopy;  Laterality: N/A;  2:35   ESOPHAGOGASTRODUODENOSCOPY (EGD) WITH PROPOFOL N/A 06/14/2019   Procedure: ESOPHAGOGASTRODUODENOSCOPY (EGD) WITH PROPOFOL;   Surgeon: Rogene Houston, MD;  [facility];  Service: Endoscopy;  Laterality: N/A;   FOREIGN BODY REMOVAL  05/31/2019   Procedure: FOREIGN BODY REMOVAL;  Surgeon: Rogene Houston, MD;  [facility];  Service: Endoscopy;;   Social History:  reports that she has never smoked. She has been exposed to tobacco smoke. She has never used smokeless tobacco. She reports that she does not drink alcohol and does not use drugs.  Allergies  Allergen Reactions   Penicillins Rash and Other (See Comments)    40 years ago, caused rash and tachycardia Has patient had a PCN reaction causing immediate rash, facial/tongue/throat swelling, SOB or lightheadedness with hypotension: No Has patient had a PCN reaction causing severe rash involving mucus membranes or skin necrosis: No Has patient had a PCN reaction that required hospitalization: No Has patient had a PCN reaction occurring within the last 10 years: No If all of the above answers are "NO", then may proceed with Cephalosporin use.     Family History  Problem Relation Age of Onset   Heart Problems Mother    Alzheimer's disease Mother    Hypertension Mother    Prostate cancer Father    Heart attack Father    Heart Problems Sister     Prior to Admission medications   Medication Sig Start Date End Date Taking? Authorizing Provider  Ascorbic Acid (VITAMIN C) 1000 MG tablet Take 1,000 mg by mouth daily.    [provider]  aspirin EC 81 MG tablet Take 1 tablet (81 mg total) by mouth every evening. 06/14/20   Rehman, Mechele Dawley, MD  Cholecalciferol (VITAMIN D) 50 MCG (2000 UT) tablet Take 2,000 Units by mouth daily.    [provider]  docusate sodium (COLACE) 100 MG capsule Take 100 mg by mouth at bedtime.    [provider]  esomeprazole (NEXIUM) 20 MG capsule Take 1 capsule (20 mg total) by mouth 2 (two) times daily before a meal. Patient taking differently: Take 20 mg by mouth daily before breakfast.  Takes once daily. Gets OTC 02/07/18   Rehman, Mechele Dawley, MD  Ferrous Sulfate (IRON) 28 MG TABS Take 28 mg by mouth. One qam    [provider]  gabapentin (NEURONTIN) 300 MG capsule Take 300 mg by mouth. Two capsules tid 10/19/21   [provider]  HYDROcodone-acetaminophen (NORCO/VICODIN) 5-325 MG tablet Take 1 tablet by mouth every 6 (six) hours as needed for moderate pain.    [provider]  Multiple Vitamin (MULTIVITAMIN WITH MINERALS) TABS tablet Take 1 tablet by mouth daily. Centrum Silver    [provider]  TURMERIC PO Take 1,000 mg by mouth daily.    [provider]    Physical Exam: Vitals:   07/20/22 0947 07/20/22 1130 07/20/22 1230 07/20/22 1330  BP:  (!) 148/49 (!) 147/58 (!) 161/80  Pulse:  80 78 86  Resp:  19 (!) 22 11  Temp:      TempSrc:      SpO2:  95% 93% 94%  Weight: 83.9 kg     Height: '5\' 5"'$  (1.651 m)      Physical Exam Constitutional:      General: She is not in acute distress.    Appearance: She is not ill-appearing or toxic-appearing.  HENT:     Head: Normocephalic.  Cardiovascular:     Rate and Rhythm: Normal rate and regular rhythm.     Heart sounds: No murmur heard. Pulmonary:     Effort: Pulmonary effort is normal. No respiratory distress.     Breath sounds: No wheezing, rhonchi or rales.  Abdominal:     General: Abdomen is flat.     Palpations: Abdomen is soft.  Musculoskeletal:     Right lower leg: No edema.     Left lower leg: No edema.  Neurological:     Mental Status: She is alert.  Psychiatric:        Mood and Affect: Mood normal.        Behavior: Behavior normal.     Data Reviewed: Hemoglobin 5.2 BMP noted.  Creatinine 1.17.  Probable CKD stage IIIa.  Assessment and Plan: Severe symptomatic anemia hemoglobin 5.2, presumed upper GI bleed given recent description of melena as well as GI history. PPI, GI consultation requested, per GI clear liquids for now.  Trend hemoglobin.   Transfuse.  CKD stage IIIa Appears to be stable.   Advance Care Planning: Full confirmed with aptient  Consults: GI  Family Communication: none  Severity of Illness: The appropriate patient status for this patient is OBSERVATION. Observation status is judged to be reasonable and necessary in order to provide the required intensity of service to ensure the patient's safety. The patient's presenting symptoms, physical exam findings, and initial radiographic and laboratory data in the context of their medical condition is felt to place them  at decreased risk for further clinical deterioration. Furthermore, it is anticipated that the patient will be medically stable for discharge from the hospital within 2 midnights of admission.   Author: Murray Hodgkins, MD 07/20/2022 1:37 PM  For on call review [email or web address].

## 2022-07-20 NOTE — Progress Notes (Deleted)
UNMATCHED BLOOD PRODUCT NOTE  Compare the patient ID on the blood tag to the patient ID on the hospital armband and Blood Bank armband. Then confirm the unit number on the blood tag matches the unit number on the blood product.  If a discrepancy is discovered return the product to blood bank immediately.   Blood Product Type: Packed Red Blood Cells  Unit #: (Found on blood product bag, begins with W) [id number]  Product Code #: (Found on blood product bag, begins with E) [id number]   Start Time: 2243  Starting Rate: 120 ml/hr  Rate increase/decreased  (if applicable):      ml/hr  Rate changed time (if applicable):    Stop Time:    All Other Documentation should be documented within the Blood Admin Flowsheet per policy.

## 2022-07-20 NOTE — Telephone Encounter (Signed)
[facility] called to report critical lab value. Hemoglobin 5.9. I discussed with chelsea carlan, NP and she wants patient to go to ED. I called and discussed with patient and she is going to get her daughter to take her to ED.

## 2022-07-20 NOTE — Progress Notes (Addendum)
UNMATCHED BLOOD PRODUCT NOTE  Compare the patient ID on the blood tag to the patient ID on the hospital armband and Blood Bank armband. Then confirm the unit number on the blood tag matches the unit number on the blood product.  If a discrepancy is discovered return the product to blood bank immediately.   Blood Product Type: Packed Red Blood Cells  Unit #: (Found on blood product bag, begins with W) [id number]  Product Code #: (Found on blood product bag, begins with E) [id number]   Start Time: 2253  Starting Rate: 120 ml/hr  Rate increase/decreased  (if applicable):  15 min post start:  200  ml/hr  Rate changed time (if applicable):    Stop Time:    All Other Documentation should be documented within the Blood Admin Flowsheet per policy.

## 2022-07-20 NOTE — H&P (View-Only) (Signed)
Gastroenterology Consult   Referring Provider: No ref. provider found Primary Care Physician:  Glenda Chroman, MD Primary Gastroenterologist:  Susanne Greenhouse   Patient ID: Valerie Griffin; MD:[id number]; January 29, 1934   Admit date: 07/20/2022  LOS: 0 days   Date of Consultation: 07/20/2022  Reason for Consultation:  anemia/melena   History of Present Illness   Valerie Griffin is a 89 y.o. year old female anemia, arthritis, constipation, dysphagia, esophageal stenosis, GERD who was recommended to proceed to the ED after she was seen by me in clinic yesterday with c/o melena over the weekend and worsening SOB. Recommend to proceed with scheduling outpatient EGD for further evaluation of suspected UGIB, increase PPI to BID dosing and H&h checked due to SOB, results this morning with hgb of 5.9. she was advised to proceed to the ER for blood transfusion and inpatient EGD.   Hgb on arrival 5.2. she is symptomatic. Maintained on nexium '20mg'$  daily as outpatient.   She has not had any further melena since this weekend. Having some epigastric discomfort, decreased appetite. No hematochezia. Continues to feel short of breath.   Last colonoscopy:2011, 1 small adenoma Last EGD: 08/21/20- Normal hypopharynx. - Normal proximal esophagus and mid esophagus. - LA Grade B reflux esophagitis with no bleeding. - Benign-appearing esophageal stenosis. Dilated. - 5 cm hiatal hernia. - Normal stomach. - Normal duodenal bulb and second portion of the duodenum. - No specimens collected.   Past Medical History:  Diagnosis Date   Anemia    Arthritis    Constipation    Dysphagia 08/16/2016   Esophageal stenosis    GERD (gastroesophageal reflux disease)     Past Surgical History:  Procedure Laterality Date   ABDOMINAL HYSTERECTOMY     APPENDECTOMY     BALLOON DILATION N/A 06/11/2020   Procedure: BALLOON DILATION;  Surgeon: Rogene Houston, MD;  [facility];  Service: Endoscopy;  Laterality:  N/A;   broken arm and wrist with plate and scews rt wrist Right    CHOLECYSTECTOMY     COLONOSCOPY     complete hysterectomy'     fibroid tumors bleeding   ESOPHAGEAL DILATION N/A 08/22/2016   Procedure: ESOPHAGEAL DILATION;  Surgeon: Rogene Houston, MD;  [facility];  Service: Endoscopy;  Laterality: N/A;   ESOPHAGEAL DILATION N/A 09/14/2016   Procedure: ESOPHAGEAL DILATION;  Surgeon: Rogene Houston, MD;  [facility];  Service: Endoscopy;  Laterality: N/A;   ESOPHAGEAL DILATION N/A 07/26/2017   Procedure: ESOPHAGEAL DILATION;  Surgeon: Rogene Houston, MD;  [facility];  Service: Endoscopy;  Laterality: N/A;   ESOPHAGEAL DILATION N/A 08/31/2017   Procedure: ESOPHAGEAL DILATION;  Surgeon: Rogene Houston, MD;  [facility];  Service: Endoscopy;  Laterality: N/A;   ESOPHAGEAL DILATION N/A 10/26/2017   Procedure: ESOPHAGEAL DILATION;  Surgeon: Rogene Houston, MD;  [facility];  Service: Endoscopy;  Laterality: N/A;   ESOPHAGEAL DILATION N/A 02/07/2018   Procedure: ESOPHAGEAL DILATION;  Surgeon: Rogene Houston, MD;  [facility];  Service: Endoscopy;  Laterality: N/A;   ESOPHAGEAL DILATION N/A 05/31/2019   Procedure: ESOPHAGEAL DILATION;  Surgeon: Rogene Houston, MD;  [facility];  Service: Endoscopy;  Laterality: N/A;   ESOPHAGEAL DILATION N/A 06/14/2019   Procedure: ESOPHAGEAL DILATION;  Surgeon: Rogene Houston, MD;  [facility];  Service: Endoscopy;  Laterality: N/A;   ESOPHAGEAL DILATION N/A 07/04/2019   Procedure:  ESOPHAGEAL DILATION;  Surgeon: Rogene Houston, MD;  [facility];  Service: Endoscopy;  Laterality: N/A;  730   ESOPHAGEAL DILATION N/A 08/01/2019   Procedure: ESOPHAGEAL DILATION;  Surgeon: Rogene Houston, MD;  [facility];  Service: Endoscopy;  Laterality: N/A;   ESOPHAGEAL DILATION N/A 05/14/2020   Procedure: ESOPHAGEAL DILATION;  Surgeon: Rogene Houston,  MD;  [facility];  Service: Endoscopy;  Laterality: N/A;   ESOPHAGOGASTRODUODENOSCOPY N/A 08/22/2016   Procedure: ESOPHAGOGASTRODUODENOSCOPY (EGD);  Surgeon: Rogene Houston, MD;  [facility];  Service: Endoscopy;  Laterality: N/A;   ESOPHAGOGASTRODUODENOSCOPY N/A 09/14/2016   Procedure: ESOPHAGOGASTRODUODENOSCOPY (EGD);  Surgeon: Rogene Houston, MD;  [facility];  Service: Endoscopy;  Laterality: N/A;  1225   ESOPHAGOGASTRODUODENOSCOPY N/A 07/26/2017   Procedure: ESOPHAGOGASTRODUODENOSCOPY (EGD);  Surgeon: Rogene Houston, MD;  [facility];  Service: Endoscopy;  Laterality: N/A;  1:45   ESOPHAGOGASTRODUODENOSCOPY N/A 08/31/2017   Procedure: ESOPHAGOGASTRODUODENOSCOPY (EGD);  Surgeon: Rogene Houston, MD;  [facility];  Service: Endoscopy;  Laterality: N/A;   ESOPHAGOGASTRODUODENOSCOPY N/A 10/26/2017   Procedure: ESOPHAGOGASTRODUODENOSCOPY (EGD);  Surgeon: Rogene Houston, MD;  [facility];  Service: Endoscopy;  Laterality: N/A;  1115   ESOPHAGOGASTRODUODENOSCOPY N/A 02/07/2018   Procedure: ESOPHAGOGASTRODUODENOSCOPY (EGD);  Surgeon: Rogene Houston, MD;  [facility];  Service: Endoscopy;  Laterality: N/A;  830   ESOPHAGOGASTRODUODENOSCOPY N/A 07/04/2019   Procedure: ESOPHAGOGASTRODUODENOSCOPY (EGD);  Surgeon: Rogene Houston, MD;  [facility];  Service: Endoscopy;  Laterality: N/A;  730   ESOPHAGOGASTRODUODENOSCOPY N/A 08/01/2019   Procedure: ESOPHAGOGASTRODUODENOSCOPY (EGD);  Surgeon: Rogene Houston, MD;  [facility];  Service: Endoscopy;  Laterality: N/A;  125   ESOPHAGOGASTRODUODENOSCOPY N/A 05/14/2020   Procedure: ESOPHAGOGASTRODUODENOSCOPY (EGD);  Surgeon: Rogene Houston, MD;  [facility];  Service: Endoscopy;  Laterality: N/A;  10:30   ESOPHAGOGASTRODUODENOSCOPY N/A 06/11/2020   Procedure: ESOPHAGOGASTRODUODENOSCOPY (EGD);  Surgeon: Rogene Houston, MD;  [facility];  Service: Endoscopy;  Laterality: N/A;  7:30   ESOPHAGOGASTRODUODENOSCOPY (EGD) WITH PROPOFOL N/A 05/31/2019   Procedure: ESOPHAGOGASTRODUODENOSCOPY (EGD) WITH PROPOFOL;  Surgeon: Rogene Houston, MD;  [facility];  Service: Endoscopy;  Laterality: N/A;  2:35   ESOPHAGOGASTRODUODENOSCOPY (EGD) WITH PROPOFOL N/A 06/14/2019   Procedure: ESOPHAGOGASTRODUODENOSCOPY (EGD) WITH PROPOFOL;  Surgeon: Rogene Houston, MD;  [facility];  Service: Endoscopy;  Laterality: N/A;   FOREIGN BODY REMOVAL  05/31/2019   Procedure: FOREIGN BODY REMOVAL;  Surgeon: Rogene Houston, MD;  [facility];  Service: Endoscopy;;    Prior to Admission medications   Medication Sig Start Date End Date Taking? Authorizing Provider  Ascorbic Acid (VITAMIN C) 1000 MG tablet Take 1,000 mg by mouth daily.    [provider]  aspirin EC 81 MG tablet Take 1 tablet (81 mg total) by mouth every evening. 06/14/20   Rehman, Mechele Dawley, MD  Cholecalciferol (VITAMIN D) 50 MCG (2000 UT) tablet Take 2,000 Units by mouth daily.    [provider]  docusate sodium (COLACE) 100 MG capsule Take 100 mg by mouth at bedtime.    [provider]  esomeprazole (NEXIUM) 20 MG capsule Take 1 capsule (20 mg total) by mouth 2 (two) times daily before a meal. Patient taking differently: Take 20 mg by mouth daily before breakfast. Takes once daily. Gets OTC 02/07/18  Rehman, Mechele Dawley, MD  Ferrous Sulfate (IRON) 28 MG TABS Take 28 mg by mouth. One qam    [provider]  gabapentin (NEURONTIN) 300 MG capsule Take 300 mg by mouth. Two capsules tid 10/19/21   [provider]  HYDROcodone-acetaminophen (NORCO/VICODIN) 5-325 MG tablet Take 1 tablet by mouth every 6 (six) hours as needed for moderate pain.    [provider]  Multiple Vitamin (MULTIVITAMIN WITH MINERALS) TABS tablet Take 1 tablet by mouth daily. Centrum Silver    [provider]  TURMERIC PO Take  1,000 mg by mouth daily.    [provider]    Current Facility-Administered Medications  Medication Dose Route Frequency Provider Last Rate Last Admin   0.9 %  sodium chloride infusion (Manually program via Guardrails IV Fluids)   Intravenous Once Sofia, Leslie K, PA-C       pantoprazole (PROTONIX) injection 40 mg  40 mg Intravenous Q12H Samuella Cota, MD       Current Outpatient Medications  Medication Sig Dispense Refill   Ascorbic Acid (VITAMIN C) 1000 MG tablet Take 1,000 mg by mouth daily.     aspirin EC 81 MG tablet Take 1 tablet (81 mg total) by mouth every evening. 30 tablet 11   Cholecalciferol (VITAMIN D) 50 MCG (2000 UT) tablet Take 2,000 Units by mouth daily.     docusate sodium (COLACE) 100 MG capsule Take 100 mg by mouth at bedtime.     esomeprazole (NEXIUM) 20 MG capsule Take 1 capsule (20 mg total) by mouth 2 (two) times daily before a meal. (Patient taking differently: Take 20 mg by mouth daily before breakfast. Takes once daily. Gets OTC)     Ferrous Sulfate (IRON) 28 MG TABS Take 28 mg by mouth. One qam     gabapentin (NEURONTIN) 300 MG capsule Take 300 mg by mouth. Two capsules tid     HYDROcodone-acetaminophen (NORCO/VICODIN) 5-325 MG tablet Take 1 tablet by mouth every 6 (six) hours as needed for moderate pain.     Multiple Vitamin (MULTIVITAMIN WITH MINERALS) TABS tablet Take 1 tablet by mouth daily. Centrum Silver     TURMERIC PO Take 1,000 mg by mouth daily.      Allergies as of 07/20/2022 - Review Complete 07/20/2022  Allergen Reaction Noted   Penicillins Rash and Other (See Comments) 08/16/2016    Family History  Problem Relation Age of Onset   Heart Problems Mother    Alzheimer's disease Mother    Hypertension Mother    Prostate cancer Father    Heart attack Father    Heart Problems Sister     Social History   Socioeconomic History   Marital status: Widowed    Spouse name: Not on file   Number of children: Not on file   Years of  education: Not on file   Highest education level: Not on file  Occupational History   Not on file  Tobacco Use   Smoking status: Never    Passive exposure: Current   Smokeless tobacco: Never  Vaping Use   Vaping Use: Never used  Substance and Sexual Activity   Alcohol use: No   Drug use: No   Sexual activity: Not on file  Other Topics Concern   Not on file  Social History Narrative   Not on file   Social Determinants of Health   Financial Resource Strain: Not on file  Food Insecurity: Not on file  Transportation Needs: Not on file  Physical Activity: Not on file  Stress: Not on file  Social Connections: Not on file  Intimate Partner Violence: Not on file     Review of Systems   Gen: Denies any fever, chills, loss of appetite, change in weight or weight loss CV: Denies chest pain, heart palpitations, syncope, edema  Resp: Denies cough, wheezing, coughing up blood, and pleurisy. +sob GI:  hematochezia, nausea, vomiting, diarrhea, constipation, dysphagia, odyonophagia, early satiety or weight loss. +recent melena +epigastric pain GU : Denies urinary burning, blood in urine, urinary frequency, and urinary incontinence. MS: Denies joint pain, limitation of movement, swelling, cramps, and atrophy.  Derm: Denies rash, itching, dry skin, hives. Psych: Denies depression, anxiety, memory loss, hallucinations, and confusion. Heme: Denies bruising or bleeding Neuro:  Denies any headaches, dizziness, paresthesias, shaking  Physical Exam   Vital Signs in last 24 hours: Temp:  [98.3 F (36.8 C)] 98.3 F (36.8 C) (02/28 0946) Pulse Rate:  [74-86] 86 (02/28 1330) Resp:  [11-22] 11 (02/28 1330) BP: (146-161)/(49-80) 161/80 (02/28 1330) SpO2:  [93 %-96 %] 94 % (02/28 1330) Weight:  [83.9 kg] 83.9 kg (02/28 0947)   General:  Alert,  Well-developed, well-nourished, pleasant and cooperative in NAD Head:  Normocephalic and atraumatic. Eyes:  Sclera clear, no icterus.   Conjunctiva  pink. Ears:  Normal auditory acuity. Mouth:  No deformity or lesions, dentition normal. Neck:  Supple; no masses Lungs:  Clear throughout to auscultation.   No wheezes, crackles, or rhonchi. No acute distress. Heart:  Regular rate and rhythm; no murmurs, clicks, rubs,  or gallops. Abdomen:  Soft, and nondistended. TTP of epigastrum. No masses, hepatosplenomegaly or hernias noted. Normal bowel sounds, without guarding, and without rebound.   Msk:  Symmetrical without gross deformities. Normal posture. Extremities:  Without clubbing or edema. Neurologic:  Alert and  oriented x4. Skin:  Intact without significant lesions or rashes. Psych:  Alert and cooperative. Normal mood and affect.  Intake/Output from previous day: No intake/output data recorded. Intake/Output this shift: No intake/output data recorded.  '@WEIGHTS'$ @  Labs/Studies   Recent Labs Recent Labs    07/19/22 1406 07/20/22 1020  WBC  --  6.2  HGB 5.9* 5.2*  HCT 20.0* 18.7*  PLT  --  265   BMET Recent Labs    07/20/22 1020  NA 137  K 4.3  CL 108  CO2 22  GLUCOSE 112*  BUN 19  CREATININE 1.17*  CALCIUM 8.2*     Assessment   Valerie Griffin is a 89 y.o. year old female female anemia, arthritis, constipation, dysphagia, esophageal stenosis, GERD who was recommended to proceed to the ED after she was seen by me in clinic yesterday with c/o melena over the weekend and worsening SOB. Recommend to proceed with scheduling outpatient EGD for further evaluation of suspected UGIB, increase PPI to BID dosing and H&h checked due to SOB, results this morning with hgb of 5.9. she was advised to proceed to the ER for blood transfusion and inpatient EGD.   Melena/anemia: acute on chronic anemia. Previously followed with hematology though recently anemia being managed by PCP. Notably she required blood and iron infusions in September, though she was not seen by our team at that time. reported melena over the weekend and  reported worsening sob at clinic visit yesterday with plans to increase PPI BID, outpatient EGD and h&h checked with severe anemia on results this morning, advised by outpatient GI team to proceed to the ED. Hgb is 5.2 on  arrival to the ED, as she had positive antibody blood, PRBCs transfusion has been delayed. She has had no further melena, no hematochezia. Has some epigastric pain and continues to feel SOB.  Recommend continuing PPI BID. Plan for EGD inpatient for further evaluation as I cannot rule out PUD, duodenitis, gastritis, AVMs. GI team will reassess in the am, if hemoglobin has improved, can proceed with EGD tomorrow.   Indications, risks and benefits of procedure discussed in detail with patient. Patient verbalized understanding and is in agreement to proceed with EGD.   Plan / Recommendations   Trend h&h, transfuse as needed Monitor for overt GI bleeding PPI BID EGD possibly tomorrow if hgb improved Should avoid all NSAIDs  6. Clear liquids, NPO Midnight     07/20/2022, 1:49 PM Tinya Cadogan L. Alver Sorrow, MSN, APRN, AGNP-C Adult-Gerontology Nurse Practitioner [facility]

## 2022-07-20 NOTE — Telephone Encounter (Signed)
Attempt to contact pt to give pre op date (07/29/22 Friday @ 10:30) over at [facility]. No answer, will try again later

## 2022-07-20 NOTE — Consult Note (Signed)
Gastroenterology Consult   Referring Provider: No ref. provider found Primary Care Physician:  Glenda Chroman, MD Primary Gastroenterologist:  Susanne Greenhouse   Patient ID: Valerie Griffin; [id number]; 1933-08-16   Admit date: 07/20/2022  LOS: 0 days   Date of Consultation: 07/20/2022  Reason for Consultation:  anemia/melena   History of Present Illness   Valerie Griffin is a 89 y.o. year old female anemia, arthritis, constipation, dysphagia, esophageal stenosis, GERD who was recommended to proceed to the ED after she was seen by me in clinic yesterday with c/o melena over the weekend and worsening SOB. Recommend to proceed with scheduling outpatient EGD for further evaluation of suspected UGIB, increase PPI to BID dosing and H&h checked due to SOB, results this morning with hgb of 5.9. she was advised to proceed to the ER for blood transfusion and inpatient EGD.   Hgb on arrival 5.2. she is symptomatic. Maintained on nexium '20mg'$  daily as outpatient.   She has not had any further melena since this weekend. Having some epigastric discomfort, decreased appetite. No hematochezia. Continues to feel short of breath.   Last colonoscopy:2011, 1 small adenoma Last EGD: 08/21/20- Normal hypopharynx. - Normal proximal esophagus and mid esophagus. - LA Grade B reflux esophagitis with no bleeding. - Benign-appearing esophageal stenosis. Dilated. - 5 cm hiatal hernia. - Normal stomach. - Normal duodenal bulb and second portion of the duodenum. - No specimens collected.   Past Medical History:  Diagnosis Date   Anemia    Arthritis    Constipation    Dysphagia 08/16/2016   Esophageal stenosis    GERD (gastroesophageal reflux disease)     Past Surgical History:  Procedure Laterality Date   ABDOMINAL HYSTERECTOMY     APPENDECTOMY     BALLOON DILATION N/A 06/11/2020   Procedure: BALLOON DILATION;  Surgeon: Rogene Houston, MD;  [facility];  Service: Endoscopy;  Laterality:  N/A;   broken arm and wrist with plate and scews rt wrist Right    CHOLECYSTECTOMY     COLONOSCOPY     complete hysterectomy'     fibroid tumors bleeding   ESOPHAGEAL DILATION N/A 08/22/2016   Procedure: ESOPHAGEAL DILATION;  Surgeon: Rogene Houston, MD;  [facility];  Service: Endoscopy;  Laterality: N/A;   ESOPHAGEAL DILATION N/A 09/14/2016   Procedure: ESOPHAGEAL DILATION;  Surgeon: Rogene Houston, MD;  [facility];  Service: Endoscopy;  Laterality: N/A;   ESOPHAGEAL DILATION N/A 07/26/2017   Procedure: ESOPHAGEAL DILATION;  Surgeon: Rogene Houston, MD;  [facility];  Service: Endoscopy;  Laterality: N/A;   ESOPHAGEAL DILATION N/A 08/31/2017   Procedure: ESOPHAGEAL DILATION;  Surgeon: Rogene Houston, MD;  [facility];  Service: Endoscopy;  Laterality: N/A;   ESOPHAGEAL DILATION N/A 10/26/2017   Procedure: ESOPHAGEAL DILATION;  Surgeon: Rogene Houston, MD;  [facility];  Service: Endoscopy;  Laterality: N/A;   ESOPHAGEAL DILATION N/A 02/07/2018   Procedure: ESOPHAGEAL DILATION;  Surgeon: Rogene Houston, MD;  [facility];  Service: Endoscopy;  Laterality: N/A;   ESOPHAGEAL DILATION N/A 05/31/2019   Procedure: ESOPHAGEAL DILATION;  Surgeon: Rogene Houston, MD;  [facility];  Service: Endoscopy;  Laterality: N/A;   ESOPHAGEAL DILATION N/A 06/14/2019   Procedure: ESOPHAGEAL DILATION;  Surgeon: Rogene Houston, MD;  [facility];  Service: Endoscopy;  Laterality: N/A;   ESOPHAGEAL DILATION N/A 07/04/2019   Procedure:  ESOPHAGEAL DILATION;  Surgeon: Rogene Houston, MD;  [facility];  Service: Endoscopy;  Laterality: N/A;  730   ESOPHAGEAL DILATION N/A 08/01/2019   Procedure: ESOPHAGEAL DILATION;  Surgeon: Rogene Houston, MD;  [facility];  Service: Endoscopy;  Laterality: N/A;   ESOPHAGEAL DILATION N/A 05/14/2020   Procedure: ESOPHAGEAL DILATION;  Surgeon: Rogene Houston,  MD;  [facility];  Service: Endoscopy;  Laterality: N/A;   ESOPHAGOGASTRODUODENOSCOPY N/A 08/22/2016   Procedure: ESOPHAGOGASTRODUODENOSCOPY (EGD);  Surgeon: Rogene Houston, MD;  [facility];  Service: Endoscopy;  Laterality: N/A;   ESOPHAGOGASTRODUODENOSCOPY N/A 09/14/2016   Procedure: ESOPHAGOGASTRODUODENOSCOPY (EGD);  Surgeon: Rogene Houston, MD;  [facility];  Service: Endoscopy;  Laterality: N/A;  1225   ESOPHAGOGASTRODUODENOSCOPY N/A 07/26/2017   Procedure: ESOPHAGOGASTRODUODENOSCOPY (EGD);  Surgeon: Rogene Houston, MD;  [facility];  Service: Endoscopy;  Laterality: N/A;  1:45   ESOPHAGOGASTRODUODENOSCOPY N/A 08/31/2017   Procedure: ESOPHAGOGASTRODUODENOSCOPY (EGD);  Surgeon: Rogene Houston, MD;  [facility];  Service: Endoscopy;  Laterality: N/A;   ESOPHAGOGASTRODUODENOSCOPY N/A 10/26/2017   Procedure: ESOPHAGOGASTRODUODENOSCOPY (EGD);  Surgeon: Rogene Houston, MD;  [facility];  Service: Endoscopy;  Laterality: N/A;  1115   ESOPHAGOGASTRODUODENOSCOPY N/A 02/07/2018   Procedure: ESOPHAGOGASTRODUODENOSCOPY (EGD);  Surgeon: Rogene Houston, MD;  [facility];  Service: Endoscopy;  Laterality: N/A;  830   ESOPHAGOGASTRODUODENOSCOPY N/A 07/04/2019   Procedure: ESOPHAGOGASTRODUODENOSCOPY (EGD);  Surgeon: Rogene Houston, MD;  [facility];  Service: Endoscopy;  Laterality: N/A;  730   ESOPHAGOGASTRODUODENOSCOPY N/A 08/01/2019   Procedure: ESOPHAGOGASTRODUODENOSCOPY (EGD);  Surgeon: Rogene Houston, MD;  [facility];  Service: Endoscopy;  Laterality: N/A;  125   ESOPHAGOGASTRODUODENOSCOPY N/A 05/14/2020   Procedure: ESOPHAGOGASTRODUODENOSCOPY (EGD);  Surgeon: Rogene Houston, MD;  [facility];  Service: Endoscopy;  Laterality: N/A;  10:30   ESOPHAGOGASTRODUODENOSCOPY N/A 06/11/2020   Procedure: ESOPHAGOGASTRODUODENOSCOPY (EGD);  Surgeon: Rogene Houston, MD;  [facility];  Service: Endoscopy;  Laterality: N/A;  7:30   ESOPHAGOGASTRODUODENOSCOPY (EGD) WITH PROPOFOL N/A 05/31/2019   Procedure: ESOPHAGOGASTRODUODENOSCOPY (EGD) WITH PROPOFOL;  Surgeon: Rogene Houston, MD;  [facility];  Service: Endoscopy;  Laterality: N/A;  2:35   ESOPHAGOGASTRODUODENOSCOPY (EGD) WITH PROPOFOL N/A 06/14/2019   Procedure: ESOPHAGOGASTRODUODENOSCOPY (EGD) WITH PROPOFOL;  Surgeon: Rogene Houston, MD;  [facility];  Service: Endoscopy;  Laterality: N/A;   FOREIGN BODY REMOVAL  05/31/2019   Procedure: FOREIGN BODY REMOVAL;  Surgeon: Rogene Houston, MD;  [facility];  Service: Endoscopy;;    Prior to Admission medications   Medication Sig Start Date End Date Taking? Authorizing Provider  Ascorbic Acid (VITAMIN C) 1000 MG tablet Take 1,000 mg by mouth daily.    [provider]  aspirin EC 81 MG tablet Take 1 tablet (81 mg total) by mouth every evening. 06/14/20   Rehman, Mechele Dawley, MD  Cholecalciferol (VITAMIN D) 50 MCG (2000 UT) tablet Take 2,000 Units by mouth daily.    [provider]  docusate sodium (COLACE) 100 MG capsule Take 100 mg by mouth at bedtime.    [provider]  esomeprazole (NEXIUM) 20 MG capsule Take 1 capsule (20 mg total) by mouth 2 (two) times daily before a meal. Patient taking differently: Take 20 mg by mouth daily before breakfast. Takes once daily. Gets OTC 02/07/18  Rehman, Mechele Dawley, MD  Ferrous Sulfate (IRON) 28 MG TABS Take 28 mg by mouth. One qam    [provider]  gabapentin (NEURONTIN) 300 MG capsule Take 300 mg by mouth. Two capsules tid 10/19/21   [provider]  HYDROcodone-acetaminophen (NORCO/VICODIN) 5-325 MG tablet Take 1 tablet by mouth every 6 (six) hours as needed for moderate pain.    [provider]  Multiple Vitamin (MULTIVITAMIN WITH MINERALS) TABS tablet Take 1 tablet by mouth daily. Centrum Silver    [provider]  TURMERIC PO Take  1,000 mg by mouth daily.    [provider]    Current Facility-Administered Medications  Medication Dose Route Frequency Provider Last Rate Last Admin   0.9 %  sodium chloride infusion (Manually program via Guardrails IV Fluids)   Intravenous Once Sofia, Leslie K, PA-C       pantoprazole (PROTONIX) injection 40 mg  40 mg Intravenous Q12H Samuella Cota, MD       Current Outpatient Medications  Medication Sig Dispense Refill   Ascorbic Acid (VITAMIN C) 1000 MG tablet Take 1,000 mg by mouth daily.     aspirin EC 81 MG tablet Take 1 tablet (81 mg total) by mouth every evening. 30 tablet 11   Cholecalciferol (VITAMIN D) 50 MCG (2000 UT) tablet Take 2,000 Units by mouth daily.     docusate sodium (COLACE) 100 MG capsule Take 100 mg by mouth at bedtime.     esomeprazole (NEXIUM) 20 MG capsule Take 1 capsule (20 mg total) by mouth 2 (two) times daily before a meal. (Patient taking differently: Take 20 mg by mouth daily before breakfast. Takes once daily. Gets OTC)     Ferrous Sulfate (IRON) 28 MG TABS Take 28 mg by mouth. One qam     gabapentin (NEURONTIN) 300 MG capsule Take 300 mg by mouth. Two capsules tid     HYDROcodone-acetaminophen (NORCO/VICODIN) 5-325 MG tablet Take 1 tablet by mouth every 6 (six) hours as needed for moderate pain.     Multiple Vitamin (MULTIVITAMIN WITH MINERALS) TABS tablet Take 1 tablet by mouth daily. Centrum Silver     TURMERIC PO Take 1,000 mg by mouth daily.      Allergies as of 07/20/2022 - Review Complete 07/20/2022  Allergen Reaction Noted   Penicillins Rash and Other (See Comments) 08/16/2016    Family History  Problem Relation Age of Onset   Heart Problems Mother    Alzheimer's disease Mother    Hypertension Mother    Prostate cancer Father    Heart attack Father    Heart Problems Sister     Social History   Socioeconomic History   Marital status: Widowed    Spouse name: Not on file   Number of children: Not on file   Years of  education: Not on file   Highest education level: Not on file  Occupational History   Not on file  Tobacco Use   Smoking status: Never    Passive exposure: Current   Smokeless tobacco: Never  Vaping Use   Vaping Use: Never used  Substance and Sexual Activity   Alcohol use: No   Drug use: No   Sexual activity: Not on file  Other Topics Concern   Not on file  Social History Narrative   Not on file   Social Determinants of Health   Financial Resource Strain: Not on file  Food Insecurity: Not on file  Transportation Needs: Not on file  Physical Activity: Not on file  Stress: Not on file  Social Connections: Not on file  Intimate Partner Violence: Not on file     Review of Systems   Gen: Denies any fever, chills, loss of appetite, change in weight or weight loss CV: Denies chest pain, heart palpitations, syncope, edema  Resp: Denies cough, wheezing, coughing up blood, and pleurisy. +sob GI:  hematochezia, nausea, vomiting, diarrhea, constipation, dysphagia, odyonophagia, early satiety or weight loss. +recent melena +epigastric pain GU : Denies urinary burning, blood in urine, urinary frequency, and urinary incontinence. MS: Denies joint pain, limitation of movement, swelling, cramps, and atrophy.  Derm: Denies rash, itching, dry skin, hives. Psych: Denies depression, anxiety, memory loss, hallucinations, and confusion. Heme: Denies bruising or bleeding Neuro:  Denies any headaches, dizziness, paresthesias, shaking  Physical Exam   Vital Signs in last 24 hours: Temp:  [98.3 F (36.8 C)] 98.3 F (36.8 C) (02/28 0946) Pulse Rate:  [74-86] 86 (02/28 1330) Resp:  [11-22] 11 (02/28 1330) BP: (146-161)/(49-80) 161/80 (02/28 1330) SpO2:  [93 %-96 %] 94 % (02/28 1330) Weight:  [83.9 kg] 83.9 kg (02/28 0947)   General:  Alert,  Well-developed, well-nourished, pleasant and cooperative in NAD Head:  Normocephalic and atraumatic. Eyes:  Sclera clear, no icterus.   Conjunctiva  pink. Ears:  Normal auditory acuity. Mouth:  No deformity or lesions, dentition normal. Neck:  Supple; no masses Lungs:  Clear throughout to auscultation.   No wheezes, crackles, or rhonchi. No acute distress. Heart:  Regular rate and rhythm; no murmurs, clicks, rubs,  or gallops. Abdomen:  Soft, and nondistended. TTP of epigastrum. No masses, hepatosplenomegaly or hernias noted. Normal bowel sounds, without guarding, and without rebound.   Msk:  Symmetrical without gross deformities. Normal posture. Extremities:  Without clubbing or edema. Neurologic:  Alert and  oriented x4. Skin:  Intact without significant lesions or rashes. Psych:  Alert and cooperative. Normal mood and affect.  Intake/Output from previous day: No intake/output data recorded. Intake/Output this shift: No intake/output data recorded.  '@WEIGHTS'$ @  Labs/Studies   Recent Labs Recent Labs    07/19/22 1406 07/20/22 1020  WBC  --  6.2  HGB 5.9* 5.2*  HCT 20.0* 18.7*  PLT  --  265   BMET Recent Labs    07/20/22 1020  NA 137  K 4.3  CL 108  CO2 22  GLUCOSE 112*  BUN 19  CREATININE 1.17*  CALCIUM 8.2*     Assessment   Valerie Griffin is a 89 y.o. year old female female anemia, arthritis, constipation, dysphagia, esophageal stenosis, GERD who was recommended to proceed to the ED after she was seen by me in clinic yesterday with c/o melena over the weekend and worsening SOB. Recommend to proceed with scheduling outpatient EGD for further evaluation of suspected UGIB, increase PPI to BID dosing and H&h checked due to SOB, results this morning with hgb of 5.9. she was advised to proceed to the ER for blood transfusion and inpatient EGD.   Melena/anemia: acute on chronic anemia. Previously followed with hematology though recently anemia being managed by PCP. Notably she required blood and iron infusions in September, though she was not seen by our team at that time. reported melena over the weekend and  reported worsening sob at clinic visit yesterday with plans to increase PPI BID, outpatient EGD and h&h checked with severe anemia on results this morning, advised by outpatient GI team to proceed to the ED. Hgb is 5.2 on  arrival to the ED, as she had positive antibody blood, PRBCs transfusion has been delayed. She has had no further melena, no hematochezia. Has some epigastric pain and continues to feel SOB.  Recommend continuing PPI BID. Plan for EGD inpatient for further evaluation as I cannot rule out PUD, duodenitis, gastritis, AVMs. GI team will reassess in the am, if hemoglobin has improved, can proceed with EGD tomorrow.   Indications, risks and benefits of procedure discussed in detail with patient. Patient verbalized understanding and is in agreement to proceed with EGD.   Plan / Recommendations   Trend h&h, transfuse as needed Monitor for overt GI bleeding PPI BID EGD possibly tomorrow if hgb improved Should avoid all NSAIDs  6. Clear liquids, NPO Midnight     07/20/2022, 1:49 PM Jamin Panther L. Alver Sorrow, MSN, APRN, AGNP-C Adult-Gerontology Nurse Practitioner [facility] at [facility]

## 2022-07-20 NOTE — ED Notes (Signed)
Lab called and patient has antibiodies in her blood so it has to be sent to [facility]. Family and patient updated about the length of time it will take to get the blood here.

## 2022-07-20 NOTE — Hospital Course (Addendum)
89 year old woman PMH including dysphagia, GERD presented to the emergency department with history of dark stools. Admitted for profound anemia and GI consultation.  Had no bleeding, responded well to transfusion, and hemoglobin stable.  EGD was unrevealing and so capsule was placed.

## 2022-07-20 NOTE — Plan of Care (Signed)
  Problem: Education: Goal: Knowledge of General Education information will improve Description: Including pain rating scale, medication(s)/side effects and non-pharmacologic comfort measures Outcome: Progressing   Problem: Health Behavior/Discharge Planning: Goal: Ability to manage health-related needs will improve Outcome: Progressing   Problem: Clinical Measurements: Goal: Diagnostic test results will improve Outcome: Progressing   

## 2022-07-21 ENCOUNTER — Observation Stay (HOSPITAL_BASED_OUTPATIENT_CLINIC_OR_DEPARTMENT_OTHER): Payer: Medicare Other | Admitting: Anesthesiology

## 2022-07-21 ENCOUNTER — Observation Stay (HOSPITAL_COMMUNITY): Payer: Medicare Other | Admitting: Anesthesiology

## 2022-07-21 ENCOUNTER — Encounter (HOSPITAL_COMMUNITY)
Admission: EM | Disposition: A | Discharge status: Home / Self Care | Payer: Self-pay | Source: Home / Self Care | Attending: Emergency Medicine

## 2022-07-21 DIAGNOSIS — K222 Esophageal obstruction: Secondary | ICD-10-CM | POA: Diagnosis not present

## 2022-07-21 DIAGNOSIS — N1831 Chronic kidney disease, stage 3a: Secondary | ICD-10-CM | POA: Diagnosis not present

## 2022-07-21 DIAGNOSIS — D649 Anemia, unspecified: Secondary | ICD-10-CM

## 2022-07-21 DIAGNOSIS — M1990 Unspecified osteoarthritis, unspecified site: Secondary | ICD-10-CM

## 2022-07-21 DIAGNOSIS — K449 Diaphragmatic hernia without obstruction or gangrene: Secondary | ICD-10-CM | POA: Diagnosis not present

## 2022-07-21 DIAGNOSIS — K922 Gastrointestinal hemorrhage, unspecified: Secondary | ICD-10-CM | POA: Diagnosis not present

## 2022-07-21 HISTORY — PX: ESOPHAGOGASTRODUODENOSCOPY (EGD) WITH PROPOFOL: SHX5813

## 2022-07-21 HISTORY — PX: GIVENS CAPSULE STUDY: SHX5432

## 2022-07-21 LAB — CBC
HCT: 26.8 % — ABNORMAL LOW (ref 36.0–46.0)
Hemoglobin: 8.5 g/dL — ABNORMAL LOW (ref 12.0–15.0)
MCH: 26.5 pg (ref 26.0–34.0)
MCHC: 31.7 g/dL (ref 30.0–36.0)
MCV: 83.5 fL (ref 80.0–100.0)
Platelets: 267 10*3/uL (ref 150–400)
RBC: 3.21 MIL/uL — ABNORMAL LOW (ref 3.87–5.11)
RDW: 17.7 % — ABNORMAL HIGH (ref 11.5–15.5)
WBC: 7.4 10*3/uL (ref 4.0–10.5)
nRBC: 0.3 % — ABNORMAL HIGH (ref 0.0–0.2)

## 2022-07-21 LAB — BASIC METABOLIC PANEL
Anion gap: 8 (ref 5–15)
BUN: 15 mg/dL (ref 8–23)
CO2: 22 mmol/L (ref 22–32)
Calcium: 8.2 mg/dL — ABNORMAL LOW (ref 8.9–10.3)
Chloride: 108 mmol/L (ref 98–111)
Creatinine, Ser: 1.03 mg/dL — ABNORMAL HIGH (ref 0.44–1.00)
GFR, Estimated: 53 mL/min — ABNORMAL LOW (ref >60)
Glucose, Bld: 97 mg/dL (ref 70–99)
Potassium: 3.8 mmol/L (ref 3.5–5.1)
Sodium: 138 mmol/L (ref 135–145)

## 2022-07-21 SURGERY — ESOPHAGOGASTRODUODENOSCOPY (EGD) WITH PROPOFOL
Anesthesia: General

## 2022-07-21 MED ORDER — LIDOCAINE HCL (PF) 2 % IJ SOLN
INTRAMUSCULAR | Status: DC | PRN
  Administered 2022-07-21: 40 mg via INTRADERMAL

## 2022-07-21 MED ORDER — SODIUM CHLORIDE 0.9 % IV SOLN: INTRAVENOUS | Status: DC

## 2022-07-21 MED ORDER — LACTATED RINGERS IV SOLN: INTRAVENOUS | Status: DC

## 2022-07-21 MED ORDER — POTASSIUM CHLORIDE IN NACL 20-0.9 MEQ/L-% IV SOLN: INTRAVENOUS | Status: DC

## 2022-07-21 MED ORDER — PROPOFOL 10 MG/ML IV BOLUS
INTRAVENOUS | Status: DC | PRN
  Administered 2022-07-21: 80 mg via INTRAVENOUS
  Administered 2022-07-21 (×2): 40 mg via INTRAVENOUS

## 2022-07-21 MED ORDER — EPHEDRINE 5 MG/ML INJ
INTRAVENOUS | Status: AC
  Filled 2022-07-21: qty 5

## 2022-07-21 NOTE — Progress Notes (Signed)
  Progress Note   Patient: Valerie Griffin [id number] DOB: 02-Oct-1933 DOA: 07/20/2022     0 DOS: the patient was seen and examined on 07/21/2022   Brief hospital course: 89 year old woman PMH including dysphagia, GERD presented to the emergency department with history of dark stools. Admitted for profound anemia and GI consultation.   Assessment and Plan: Severe symptomatic anemia hemoglobin 5.2, presumed upper GI bleed given recent description of melena as well as GI history. 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  Constitutional:      General: She is not in acute distress.    Appearance: She is not ill-appearing or toxic-appearing.  Cardiovascular:     Rate and Rhythm: Normal rate and regular rhythm.     Heart sounds: No murmur heard. Pulmonary:     Effort: Pulmonary effort is normal. No respiratory distress.     Breath sounds: No wheezing, rhonchi or rales.  Neurological:     Mental Status: She is alert.  Psychiatric:        Mood and Affect: Mood normal.        Behavior: Behavior normal.     Data Reviewed: Creatinine 1.03 Hgb up to 8.5 s/p PRBC transfusion  Family Communication: son and daughter at bedside  Disposition: Status is: Observation   Planned Discharge Destination: Home    Time spent: 20 minutes  Author: Murray Hodgkins, MD 07/21/2022 4:18 PM  For on call review [email or web address].

## 2022-07-21 NOTE — Progress Notes (Signed)
Frazier Butt, [facility] notified of removal of [place] at 2311 tonight.

## 2022-07-21 NOTE — Care Management Obs Status (Signed)
[facility] NOTIFICATION   Patient Details  Name: Valerie Griffin MRN: [id number] Date of Birth: 1934-04-12   Medicare Observation Status Notification Given:  Yes    Tommy Medal 07/21/2022, 4:15 PM

## 2022-07-21 NOTE — Progress Notes (Signed)
  Transition of Care [facility]) Screening Note   Patient Details  Name: Valerie Griffin Date of Birth: 02-06-1934   Transition of Care [facility]) CM/SW Contact:    Ihor Gully, LCSW Phone Number: 07/21/2022, 2:15 PM    Transition of Care Department [facility]) has reviewed patient and no TOC needs have been identified at this time. We will continue to monitor patient advancement through interdisciplinary progression rounds. If new patient transition needs arise, please place a TOC consult.

## 2022-07-21 NOTE — Progress Notes (Signed)
UNMATCHED BLOOD PRODUCT NOTE  Compare the patient ID on the blood tag to the patient ID on the hospital armband and Blood Bank armband. Then confirm the unit number on the blood tag matches the unit number on the blood product.  If a discrepancy is discovered return the product to blood bank immediately.   Blood Product Type: Packed Red Blood Cells  Unit #: (Found on blood product bag, begins with [id number]  Product Code #: (Found on blood product bag, begins with E) #0382V00   Start Time: 0143  Starting Rate: 120 ml/hr  Rate increase/decreased  (if applicable):  15 min post transfusion start  150 ml/hr @ 0158  Rate changed time (if applicable):    Stop Time: Blood tranfusion completion @ 0407 for a total of 315 ml infused   All Other Documentation should be documented within the Blood Admin Flowsheet per policy.

## 2022-07-21 NOTE — Op Note (Signed)
[facility] Patient Name: Valerie Griffin Procedure Date: 07/21/2022 2:43 PM MRN: [id number] Date of Birth: 19-Aug-1933 Attending MD: Valerie Griffin , MD, [id number] CSN: [id number] Age: 89 Admit Type: Inpatient Procedure:                Upper GI endoscopy Indications:              Occult blood in stool Providers:                Valerie Richards, MD, Gwynneth Albright RN,                            RN, Raphael Gibney, Technician Referring MD:              Medicines:                Propofol per Anesthesia Complications:            No immediate complications. Estimated Blood Loss:     Estimated blood loss: none. Procedure:                Pre-Anesthesia Assessment:                           - Prior to the procedure, a History and Physical                            was performed, and patient medications and                            allergies were reviewed. The patient's tolerance of                            previous anesthesia was also reviewed. The risks                            and benefits of the procedure and the sedation                            options and risks were discussed with the patient.                            All questions were answered, and informed consent                            was obtained. Prior Anticoagulants: The patient has                            taken no anticoagulant or antiplatelet agents. ASA                            Grade Assessment: III - A patient with severe                            systemic disease. After reviewing the risks and  benefits, the patient was deemed in satisfactory                            condition to undergo the procedure.                           After obtaining informed consent, the endoscope was                            passed under direct vision. Throughout the                            procedure, the patient's blood pressure, pulse, and                             oxygen saturations were monitored continuously. The                            [id number]) scope was introduced through the                            mouth, and advanced to the second part of duodenum.                            The upper GI endoscopy was accomplished without                            difficulty. The patient tolerated the procedure                            well. 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 No active esophagitis - did not see a tumor or       Barrett's epithelium; mild narrowing - was easily traversed with a       gastroscope      A large hiatal hernia was present. Stomach empty. No blood in the upper       GI tract. Gastric mucosa appeared normal. Specifically, no ulcer or       infiltrating process. No Cameron lesions patent pylorus.      The duodenal bulb and second portion of the duodenum were normal. The       scope was withdrawn the capsule device was loaded with the capsule scope       was reintroduced into the stomach capsule device was advanced across the       pylorus and capsule was deployed uneventfully. 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 No dilation required.                           -Large hiatal hernia. Otherwise, normal stomach.                           - Normal duodenal bulb and second portion of the                            duodenum. No bleeding lesion found in the upper GI                            tract.                           -Given capsule deployed into the duodenum. Moderate Sedation:      Moderate (conscious) sedation was personally administered by an       anesthesia professional. The following parameters  were monitored: oxygen       saturation, heart rate, blood pressure, respiratory rate, EKG, adequacy       of pulmonary ventilation, and response to care. Recommendation:           --Diet per capsule protocol; further                            recommendations to follow. Findings and                            recommendations/plan of management reviewed in                            detail with [name], daughter, at                            [phone] questions answered.                           - Return patient to hospital ward for ongoing care. Procedure Code(s):        --- Professional ---                           [phone], Esophagogastroduodenoscopy, flexible,                            transoral; diagnostic, including collection of                            specimen(s) by brushing or washing, when performed                            (separate procedure) Diagnosis Code(s):        --- Professional ---                           K44.9, Diaphragmatic hernia without obstruction or  gangrene                           R19.5, Other fecal abnormalities CPT copyright 2022 American [employer] Association. All rights reserved. The codes documented in this report are preliminary and upon coder review may  be revised to meet current compliance requirements. Valerie Griffin. Valerie Tumminello, MD Valerie Richards, MD 07/21/2022 3:26:29 PM This report has been signed electronically. Number of Addenda: 0

## 2022-07-21 NOTE — Transfer of Care (Signed)
Immediate Anesthesia Transfer of Care Note  Patient: Valerie Griffin  Procedure(s) Performed: ESOPHAGOGASTRODUODENOSCOPY (EGD) WITH PROPOFOL GIVENS CAPSULE STUDY  Patient Location: PACU  Anesthesia Type:General  Level of Consciousness: drowsy  Airway & Oxygen Therapy: Patient Spontanous Breathing and Patient connected to nasal cannula oxygen  Post-op Assessment: Report given to RN and Post -op Vital signs reviewed and stable  Post vital signs: Reviewed and stable  Last Vitals:  Vitals Value Taken Time  BP 150/56   Temp 36.9 C 07/21/22 1517  Pulse 94 07/21/22 1517  Resp 25 07/21/22 1517  SpO2 94 % 07/21/22 1517    Last Pain:  Vitals:   07/21/22 1502  TempSrc:   PainSc: 0-No pain         Complications: No notable events documented.

## 2022-07-21 NOTE — Anesthesia Postprocedure Evaluation (Signed)
Anesthesia Post Note  Patient: Valerie Griffin  Procedure(s) Performed: ESOPHAGOGASTRODUODENOSCOPY (EGD) WITH PROPOFOL GIVENS CAPSULE STUDY  Patient location during evaluation: PACU Anesthesia Type: General Level of consciousness: awake and alert Pain management: pain level controlled Vital Signs Assessment: post-procedure vital signs reviewed and stable Respiratory status: spontaneous breathing, nonlabored ventilation and respiratory function stable Cardiovascular status: blood pressure returned to baseline and stable Postop Assessment: no apparent nausea or vomiting Anesthetic complications: no  No notable events documented.   Last Vitals:  Vitals:   07/21/22 1517 07/21/22 1529  BP: (!) 150/56 (!) 150/59  Pulse: 94 90  Resp: (!) 25 (!) 24  Temp: 36.9 C   SpO2: 95% 93%    Last Pain:  Vitals:   07/21/22 1529  TempSrc:   PainSc: 0-No pain                 Srihan Brutus C Mitra Duling

## 2022-07-21 NOTE — Progress Notes (Deleted)
UNMATCHED BLOOD PRODUCT NOTE  Compare the patient ID on the blood tag to the patient ID on the hospital armband and Blood Bank armband. Then confirm the unit number on the blood tag matches the unit number on the blood product.  If a discrepancy is discovered return the product to blood bank immediately.   Blood Product Type: Packed Red Blood Cells  Unit #: (Found on blood product bag, begins with [id number]  Product Code #: (Found on blood product bag, begins with E) [id number]   Start Time: 0143  Starting Rate: 120 ml/hr  Rate increase/decreased  (if applicable):      ml/hr  Rate changed time (if applicable):    Stop Time:    All Other Documentation should be documented within the Blood Admin Flowsheet per policy.

## 2022-07-21 NOTE — Progress Notes (Deleted)
UNMATCHED BLOOD PRODUCT NOTE  Compare the patient ID on the blood tag to the patient ID on the hospital armband and Blood Bank armband. Then confirm the unit number on the blood tag matches the unit number on the blood product.  If a discrepancy is discovered return the product to blood bank immediately.   Blood Product Type: Packed Red Blood Cells  Unit #: (Found on blood product bag, begins with [id number]  Product Code #: (Found on blood product bag, begins with E) #[id number]   Start Time: 0143  Starting Rate: 120 ml/hr  Rate increase/decreased  (if applicable):  15 min post transfusion start  150 ml/hr  Rate changed time (if applicable):    Stop Time:    All Other Documentation should be documented within the Blood Admin Flowsheet per policy.

## 2022-07-21 NOTE — Progress Notes (Addendum)
Patient briefly seen. VSS. Hgb 8.5 after 2 units prbcs. No further melena since admission. Plans for EGD with possible capsule placement today.  I have discussed the risks, alternatives, benefits with regards to but not limited to the risk of reaction to medication, bleeding, infection, perforation and the patient is agreeable to proceed. Written consent to be obtained.   Laureen Ochs. Bernarda Caffey [facility] [phone] 2/29/20249:32 AM

## 2022-07-21 NOTE — Anesthesia Preprocedure Evaluation (Signed)
Anesthesia Evaluation  Patient identified by MRN, date of birth, ID band Patient awake    Reviewed: Allergy & Precautions, H&P , NPO status , Patient's Chart, lab work & pertinent test results  Airway Mallampati: III  TM Distance: >3 FB Neck ROM: Full    Dental  (+) Upper Dentures, Lower Dentures   Pulmonary neg pulmonary ROS   Pulmonary exam normal breath sounds clear to auscultation       Cardiovascular negative cardio ROS Normal cardiovascular exam Rhythm:Regular Rate:Normal     Neuro/Psych negative neurological ROS  negative psych ROS   GI/Hepatic Neg liver ROS,GERD  Medicated and Controlled,,Upper GI bleeding   Endo/Other  negative endocrine ROS    Renal/GU negative Renal ROS  negative genitourinary   Musculoskeletal  (+) Arthritis , Osteoarthritis,    Abdominal   Peds negative pediatric ROS (+)  Hematology  (+) Blood dyscrasia, anemia   Anesthesia Other Findings   Reproductive/Obstetrics negative OB ROS                             Anesthesia Physical Anesthesia Plan  ASA: 3  Anesthesia Plan: General   Post-op Pain Management: Minimal or no pain anticipated   Induction: Intravenous  PONV Risk Score and Plan: Propofol infusion  Airway Management Planned: Nasal Cannula and Natural Airway  Additional Equipment:   Intra-op Plan:   Post-operative Plan: Possible Post-op intubation/ventilation  Informed Consent: I have reviewed the patients History and Physical, chart, labs and discussed the procedure including the risks, benefits and alternatives for the proposed anesthesia with the patient or authorized representative who has indicated his/her understanding and acceptance.     Dental advisory given  Plan Discussed with: CRNA and Surgeon  Anesthesia Plan Comments:         Anesthesia Quick Evaluation

## 2022-07-21 NOTE — Telephone Encounter (Signed)
Valerie Griffin for 3/12 has been cancelled. She is being done today as a inpatient. Magda Paganini told me to cancel outpatient procedure

## 2022-07-21 NOTE — Interval H&P Note (Signed)
History and Physical Interval Note:  07/21/2022 2:50 PM  Valerie Griffin  has presented today for surgery, with the diagnosis of melena, iron deficiency anemia.  The various methods of treatment have been discussed with the patient and family. After consideration of risks, benefits and other options for treatment, the patient has consented to  Procedure(s): ESOPHAGOGASTRODUODENOSCOPY (EGD) WITH PROPOFOL (N/A) GIVENS CAPSULE STUDY (N/A) as a surgical intervention.  The patient's history has been reviewed, patient examined, no change in status, stable for surgery.  I have reviewed the patient's chart and labs.  Questions were answered to the patient's satisfaction.     Manus Rudd    Patient remained stable overnight.  She has been transfused.  Plan for EGD with possible esophageal dilation and VCE today.  This been to previously reviewed with patient and patient's son Nicki Reaper yesterday and today questions been answered all parties agreeable.    Patient tells me that she only has occasional trouble with things like ham and beef if she does not chew thoroughly.  She does not feel dysphagia is an issue   may or may not benefit from esophageal dilation.

## 2022-07-22 DIAGNOSIS — K922 Gastrointestinal hemorrhage, unspecified: Secondary | ICD-10-CM | POA: Diagnosis not present

## 2022-07-22 DIAGNOSIS — D62 Acute posthemorrhagic anemia: Secondary | ICD-10-CM

## 2022-07-22 DIAGNOSIS — K921 Melena: Secondary | ICD-10-CM

## 2022-07-22 LAB — TYPE AND SCREEN
ABO/RH(D): A POS
Antibody Screen: POSITIVE
DAT, IgG: POSITIVE
Donor AG Type: NEGATIVE
Donor AG Type: NEGATIVE
PT AG Type: NEGATIVE
Unit division: 0
Unit division: 0

## 2022-07-22 LAB — BPAM RBC
Blood Product Expiration Date: 202403192359
Blood Product Expiration Date: 202404052359
ISSUE DATE / TIME: 202402282233
ISSUE DATE / TIME: 202402291034
Unit Type and Rh: 5100
Unit Type and Rh: 6200

## 2022-07-22 LAB — CBC
HCT: 27.5 % — ABNORMAL LOW (ref 36.0–46.0)
Hemoglobin: 8.2 g/dL — ABNORMAL LOW (ref 12.0–15.0)
MCH: 25.6 pg — ABNORMAL LOW (ref 26.0–34.0)
MCHC: 29.8 g/dL — ABNORMAL LOW (ref 30.0–36.0)
MCV: 85.9 fL (ref 80.0–100.0)
Platelets: 224 10*3/uL (ref 150–400)
RBC: 3.2 MIL/uL — ABNORMAL LOW (ref 3.87–5.11)
RDW: 18.8 % — ABNORMAL HIGH (ref 11.5–15.5)
WBC: 6.4 10*3/uL (ref 4.0–10.5)
nRBC: 0 % (ref 0.0–0.2)

## 2022-07-22 MED ORDER — BISACODYL 5 MG PO TBEC
10.0000 mg | DELAYED_RELEASE_TABLET | Freq: Once | ORAL | Status: AC
  Administered 2022-07-22: 10 mg via ORAL
  Filled 2022-07-22: qty 2

## 2022-07-22 MED ORDER — SODIUM CHLORIDE 0.9 % IV SOLN: INTRAVENOUS | Status: DC

## 2022-07-22 MED ORDER — PEG 3350-KCL-NA BICARB-NACL 420 G PO SOLR
4000.0000 mL | Freq: Once | ORAL | Status: AC
  Administered 2022-07-22: 4000 mL via ORAL

## 2022-07-22 NOTE — Progress Notes (Signed)
  Progress Note   Patient: Valerie Griffin [id number] DOB: 04/16/1934 DOA: 07/20/2022     0 DOS: the patient was seen and examined on 07/22/2022   Brief hospital course: 89 year old woman PMH including dysphagia, GERD presented to the emergency department with history of dark stools. Admitted for profound anemia and GI consultation.  Had no bleeding, responded well to transfusion, and hemoglobin stable.  EGD was unrevealing and so capsule was placed.  Assessment and Plan: Severe symptomatic anemia hemoglobin 5.2, presumed upper GI bleed given recent description of melena as well as GI history. Hgb up appropriately with PRBC transfusion and Hgb remains stable. No bleeding noted EGD showed noncritical peptic stricture with associated pseudo diverticula Video capsule placed, await results and final GI recs. Hope to discharge. Message sent to GI. Await response.   CKD stage IIIa Stable       Subjective:  Feels better No bleeding Ready to go home  Physical Exam: Vitals:   07/21/22 1529 07/21/22 2011 07/22/22 0410 07/22/22 1300  BP: (!) 150/59 (!) 177/71 (!) 164/66 (!) 172/64  Pulse: 90 99 82 80  Resp: (!) 24 18 (!) 22 20  Temp:  98.7 F (37.1 C) 98 F (36.7 C) 98.1 F (36.7 C)  TempSrc:  Oral Oral Oral  SpO2: 93% 93% 93% 92%  Weight:      Height:       Physical Exam Vitals reviewed.  Constitutional:      General: She is not in acute distress.    Appearance: She is not ill-appearing or toxic-appearing.  Cardiovascular:     Rate and Rhythm: Normal rate and regular rhythm.     Heart sounds: No murmur heard. Pulmonary:     Effort: Pulmonary effort is normal. No respiratory distress.     Breath sounds: No wheezing, rhonchi or rales.  Neurological:     Mental Status: She is alert.  Psychiatric:        Mood and Affect: Mood normal.        Behavior: Behavior normal.    Data Reviewed: Hgb stable at 8.2 Await capsule study and GI recs  Family Communication:    Disposition: Status is: Observation   Planned Discharge Destination: Home    Time spent: 20 minutes  Author: Murray Hodgkins, MD 07/22/2022 3:28 PM  For on call review [email or web address].

## 2022-07-22 NOTE — Op Note (Signed)
Small Bowel Givens Capsule Study Procedure date:  07/22/22  PCP:  Dr. Glenda Chroman, MD  Indication for procedure: 89 y.o. female with history of anemia, arthiritis, dysphagia, esophogeal stenosis, GERD, who presented to the ED with melena and acute on chronic anemia. Hgb 5.2 on arrival to the ED and also with shortness of breath and epigastric pain. She received 2u of blood and decision made to perform EGD and place video capsule with EGD given history of esophageal stricture and possible challenge of performing this outpatient.   Patient data:  Wt: 83.9 kg Ht: 5'5"  Findings:  View frequently obscured my dark effluent/melena and more distally scattered debris and fecal material. Evidence of duodenitis and patchy erythema throughout the small bowel. Mild oozing likely secondary to scope for EGD (this area is likely at the area of peptic stricture).  No obvious upper or mid bowel source of melena.   First Gastric image:  00:01:54 First Duodenal image: 00:03:01 First Cecal image: 07:25:41 Gastric Passage time: 0h 108mSmall Bowel Passage time:  7h 280mSummary & Recommendations: [age].o. female with history of chronic anemia, peptic stricture, and hiatal hernia who presented to the ED with symptomatic acute on chronic anemia and recent melanotic stools. Hgb 5.2 on arrival to the ED and was transfused 2 units of blood. She underwent EGD 2/29 with evidence of large hiatal hernia, peptic stricture, and otherwise normal stomach and duodenum therefore capsule deployed. No recent colonoscopy. Study complete to the cecum without any obvious bleeding lesions throughout the small bowel however views frequently obscured by melanotic fluid and fecal material. She does have evidence of duodenitis which could be contributing to this.  -Follow up iron studies in a few weeks. -Continue PPI BID -Continue to monitor H/H, transfuse Hgb <7 -Hold aspirin for now.  -Avoid NSAIDs -Clear liquid diet -Will offer  colonoscopy for complete workup of anemia while inpatient -May need follow up with hematology   CoVenetia NightMSN, APRN, FNP-BC, AGACNP-BC [facility]

## 2022-07-22 NOTE — Progress Notes (Signed)
Nutrition Brief Note  Patient identified on the Malnutrition Screening Tool (MST) Report.   Chart reviewed and talked with patient.   PMH: CKD-3a, GERD.   Patient presents with profound anemia (Hgb 5.2) and has received PRBC.   EGD yesterday- no active bleeding noted, video capsule placed.   Wt Readings from Last 15 Encounters:  07/21/22 83.9 kg  07/19/22 83.7 kg  11/11/21 85.2 kg  11/03/20 87.1 kg  05/14/20 87.5 kg  05/05/20 89.8 kg  10/29/19 83.8 kg  08/01/19 82.6 kg  06/14/19 83.5 kg  05/31/19 83.5 kg  05/29/19 83.5 kg  05/09/19 85 kg  02/07/18 80.3 kg  11/15/17 79.2 kg  10/26/17 78 kg    Body mass index is 30.78 kg/m. Patient meets criteria for obese based on current BMI. Stable weight history as noted above.  Current diet order is NPO/Clear liquids. Patient complaining of feeling hungry and is hoping to be able to eat soon. According to patient -waiting for GI to clear for discharge?   Labs and medications reviewed.      Latest Ref Rng & Units 07/21/2022    7:59 AM 07/20/2022   10:20 AM 05/09/2019    3:13 PM  BMP  Glucose 70 - 99 mg/dL 97  112  93   BUN 8 - 23 mg/dL '15  19  12   '$ Creatinine 0.44 - 1.00 mg/dL 1.03  1.17  1.02   BUN/Creat Ratio 6 - 22 (calc)   12   Sodium 135 - 145 mmol/L 138  137  141   Potassium 3.5 - 5.1 mmol/L 3.8  4.3  4.3   Chloride 98 - 111 mmol/L 108  108  107   CO2 22 - 32 mmol/L '22  22  23   '$ Calcium 8.9 - 10.3 mg/dL 8.2  8.2  9.2      No nutrition interventions warranted at this time. If nutrition issues arise, please consult RD.   Colman Cater MS,RD,CSG,LDN Contact: Shea Evans

## 2022-07-23 ENCOUNTER — Observation Stay (HOSPITAL_COMMUNITY): Payer: Medicare Other | Admitting: Anesthesiology

## 2022-07-23 ENCOUNTER — Encounter (HOSPITAL_COMMUNITY)
Admission: EM | Disposition: A | Discharge status: Home / Self Care | Payer: Self-pay | Source: Home / Self Care | Attending: Emergency Medicine

## 2022-07-23 DIAGNOSIS — K5730 Diverticulosis of large intestine without perforation or abscess without bleeding: Secondary | ICD-10-CM

## 2022-07-23 DIAGNOSIS — D124 Benign neoplasm of descending colon: Secondary | ICD-10-CM

## 2022-07-23 DIAGNOSIS — T184XXA Foreign body in colon, initial encounter: Secondary | ICD-10-CM | POA: Diagnosis not present

## 2022-07-23 DIAGNOSIS — D122 Benign neoplasm of ascending colon: Secondary | ICD-10-CM | POA: Diagnosis not present

## 2022-07-23 DIAGNOSIS — Z7982 Long term (current) use of aspirin: Secondary | ICD-10-CM | POA: Diagnosis not present

## 2022-07-23 DIAGNOSIS — D62 Acute posthemorrhagic anemia: Secondary | ICD-10-CM | POA: Diagnosis not present

## 2022-07-23 DIAGNOSIS — D649 Anemia, unspecified: Secondary | ICD-10-CM | POA: Diagnosis not present

## 2022-07-23 DIAGNOSIS — D123 Benign neoplasm of transverse colon: Secondary | ICD-10-CM

## 2022-07-23 DIAGNOSIS — K921 Melena: Secondary | ICD-10-CM | POA: Diagnosis not present

## 2022-07-23 DIAGNOSIS — K922 Gastrointestinal hemorrhage, unspecified: Secondary | ICD-10-CM | POA: Diagnosis not present

## 2022-07-23 DIAGNOSIS — Z79899 Other long term (current) drug therapy: Secondary | ICD-10-CM | POA: Diagnosis not present

## 2022-07-23 HISTORY — PX: POLYPECTOMY: SHX5525

## 2022-07-23 HISTORY — PX: COLONOSCOPY WITH PROPOFOL: SHX5780

## 2022-07-23 SURGERY — COLONOSCOPY WITH PROPOFOL
Anesthesia: General

## 2022-07-23 MED ORDER — PROPOFOL 10 MG/ML IV BOLUS
INTRAVENOUS | Status: AC
  Filled 2022-07-23: qty 20

## 2022-07-23 MED ORDER — PROPOFOL 10 MG/ML IV BOLUS
INTRAVENOUS | Status: DC | PRN
  Administered 2022-07-23: 20 mg via INTRAVENOUS
  Administered 2022-07-23: 10 mg via INTRAVENOUS
  Administered 2022-07-23 (×2): 20 mg via INTRAVENOUS
  Administered 2022-07-23: 50 mg via INTRAVENOUS
  Administered 2022-07-23: 20 mg via INTRAVENOUS

## 2022-07-23 MED ORDER — LACTATED RINGERS IV SOLN: INTRAVENOUS | Status: DC | PRN

## 2022-07-23 MED ORDER — PHENYLEPHRINE 80 MCG/ML (10ML) SYRINGE FOR IV PUSH (FOR BLOOD PRESSURE SUPPORT)
PREFILLED_SYRINGE | INTRAVENOUS | Status: AC
  Filled 2022-07-23: qty 10

## 2022-07-23 MED ORDER — LIDOCAINE HCL (PF) 2 % IJ SOLN
INTRAMUSCULAR | Status: AC
  Filled 2022-07-23: qty 5

## 2022-07-23 MED ORDER — PROPOFOL 500 MG/50ML IV EMUL
INTRAVENOUS | Status: DC | PRN
  Administered 2022-07-23: 100 ug/kg/min via INTRAVENOUS

## 2022-07-23 MED ORDER — IRON (FERROUS SULFATE) 325 (65 FE) MG PO TABS: 325.0000 mg | ORAL_TABLET | Freq: Two times a day (BID) | ORAL | Status: DC

## 2022-07-23 MED ORDER — LIDOCAINE HCL (CARDIAC) PF 100 MG/5ML IV SOSY
PREFILLED_SYRINGE | INTRAVENOUS | Status: DC | PRN
  Administered 2022-07-23: 60 mg via INTRATRACHEAL

## 2022-07-23 NOTE — Progress Notes (Signed)
Removed IV-CDI. Charge RN, Apolonio Schneiders, reviewed d/c paperwork with patient and family. Stable patient and belongings wheeled to main entrance where she was picked up by her daughter.

## 2022-07-23 NOTE — Transfer of Care (Signed)
Immediate Anesthesia Transfer of Care Note  Patient: Valerie Griffin  Procedure(s) Performed: COLONOSCOPY WITH PROPOFOL POLYPECTOMY  Patient Location: PACU  Anesthesia Type:General  Level of Consciousness: awake, alert , oriented, and sedated  Airway & Oxygen Therapy: Patient Spontanous Breathing and Patient connected to nasal cannula oxygen  Post-op Assessment: Report given to RN and Post -op Vital signs reviewed and stable  Post vital signs: Reviewed and stable  Last Vitals:  Vitals Value Taken Time  BP 155/70 07/23/22 0945  Temp 97.9 07/23/22  0946  Pulse 77 07/23/22 0946  Resp 26 07/23/22 0946  SpO2 100 % 07/23/22 0946  Vitals shown include unvalidated device data.  Last Pain:  Vitals:   07/23/22 0903  TempSrc: Oral  PainSc: 0-No pain      Patients Stated Pain Goal: 7 ([id number])  Complications: No notable events documented.

## 2022-07-23 NOTE — Anesthesia Preprocedure Evaluation (Signed)
Anesthesia Evaluation  Patient identified by MRN, date of birth, ID band Patient awake    Reviewed: Allergy & Precautions, H&P , NPO status , Patient's Chart, lab work & pertinent test results  Airway Mallampati: III  TM Distance: >3 FB Neck ROM: Full    Dental  (+) Upper Dentures, Lower Dentures   Pulmonary neg pulmonary ROS   Pulmonary exam normal breath sounds clear to auscultation       Cardiovascular negative cardio ROS Normal cardiovascular exam Rhythm:Regular Rate:Normal     Neuro/Psych negative neurological ROS  negative psych ROS   GI/Hepatic Neg liver ROS,GERD  Medicated and Controlled,,Upper GI bleeding   Endo/Other  negative endocrine ROS    Renal/GU negative Renal ROS  negative genitourinary   Musculoskeletal  (+) Arthritis , Osteoarthritis,    Abdominal   Peds negative pediatric ROS (+)  Hematology  (+) Blood dyscrasia, anemia   Anesthesia Other Findings   Reproductive/Obstetrics negative OB ROS                             Anesthesia Physical Anesthesia Plan  ASA: 3  Anesthesia Plan: General   Post-op Pain Management: Minimal or no pain anticipated   Induction: Intravenous  PONV Risk Score and Plan: Propofol infusion  Airway Management Planned: Nasal Cannula and Natural Airway  Additional Equipment:   Intra-op Plan:   Post-operative Plan: Possible Post-op intubation/ventilation  Informed Consent: I have reviewed the patients History and Physical, chart, labs and discussed the procedure including the risks, benefits and alternatives for the proposed anesthesia with the patient or authorized representative who has indicated his/her understanding and acceptance.     Dental advisory given  Plan Discussed with: CRNA and Surgeon  Anesthesia Plan Comments:         Anesthesia Quick Evaluation

## 2022-07-23 NOTE — Anesthesia Postprocedure Evaluation (Signed)
Anesthesia Post Note  Patient: Valerie Griffin  Procedure(s) Performed: COLONOSCOPY WITH PROPOFOL POLYPECTOMY  Patient location during evaluation: PACU Anesthesia Type: General Level of consciousness: awake and alert and oriented Pain management: pain level controlled Vital Signs Assessment: post-procedure vital signs reviewed and stable Respiratory status: spontaneous breathing, nonlabored ventilation and respiratory function stable Cardiovascular status: blood pressure returned to baseline and stable Postop Assessment: no apparent nausea or vomiting Anesthetic complications: no  No notable events documented.   Last Vitals:  Vitals:   07/23/22 0903 07/23/22 0945  BP: (!) 187/84 (!) 155/70  Pulse: 84 82  Resp: 17 20  Temp: 36.8 C 36.6 C  SpO2: 97% 99%    Last Pain:  Vitals:   07/23/22 0945  TempSrc:   PainSc: 0-No pain                 Cuthbert Turton C Traevion Poehler

## 2022-07-23 NOTE — Discharge Summary (Addendum)
Physician Discharge Summary   Patient: Valerie Griffin MRN: [id number] DOB: [birth year]  Admit date:     07/20/2022  Discharge date: 07/23/22  Discharge Physician: Murray Hodgkins   PCP: Glenda Chroman, MD   Recommendations at discharge:  See hospital course below GI recommended hematology referral (placed)  Discharge Diagnoses: Principal Problem:   Symptomatic anemia Active Problems:   ABLA (acute blood loss anemia)   Gastrointestinal hemorrhage  Resolved Problems:   * No resolved hospital problems. *  Hospital Course: 89 year old woman PMH including dysphagia, GERD presented to the emergency department with history of dark stools. Admitted for profound anemia and GI consultation.  Had no bleeding, responded well to transfusion, and hemoglobin stable.  EGD was unrevealing and so capsule was placed.  Underwent colonoscopy which was unrevealing.  GI recommended outpatient hematology evaluation.  Severe symptomatic anemia, ABLA hemoglobin 5.2, presumed upper GI bleed given recent description of melena as well as GI history. Hgb up appropriately with PRBC transfusion and Hgb remained stable. 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 Evidence of duodenitis and patchy erythema throughout the small bowel. Mild oozing likely secondary to scope for EGD (this area is likely at the area of peptic stricture). No obvious upper or mid bowel source of melena. Colonoscopy.  Polyps removed. Stopped ASA, has no clear compelling indication, no history of stroke or CAD. Twice daily PPI, iron sulfate twice daily.  Discussed with son and daughter at bedside. Referral to hematology placed as requested by GI   CKD stage IIIa Stable      Consultants:  Gastroenterology  Procedures performed:  EGD Video capsule  placement Colonoscopy   Disposition: Home Diet recommendation:  Regular diet DISCHARGE MEDICATION: Allergies as of 07/23/2022       Reactions   Penicillins Rash, Other (See Comments)   40 years ago, caused rash and tachycardia Has patient had a PCN reaction causing immediate rash, facial/tongue/throat swelling, SOB or lightheadedness with hypotension: No Has patient had a PCN reaction causing severe rash involving mucus membranes or skin necrosis: No Has patient had a PCN reaction that required hospitalization: No Has patient had a PCN reaction occurring within the last 10 years: No If all of the above answers are "NO", then may proceed with Cephalosporin use.        Medication List     STOP taking these medications    aspirin EC 81 MG tablet       TAKE these medications    docusate sodium 100 MG capsule Commonly known as: COLACE Take 100 mg by mouth at bedtime.   esomeprazole 20 MG capsule Commonly known as: NEXIUM Take 1 capsule (20 mg total) by mouth 2 (two) times daily before a meal. What changed:  when to take this additional instructions   gabapentin 300 MG capsule Commonly known as: NEURONTIN Take 300 mg by mouth. Two capsules tid   HYDROcodone-acetaminophen 5-325 MG tablet Commonly known as: NORCO/VICODIN Take 1 tablet by mouth every 6 (six) hours as needed for moderate pain.   Iron (Ferrous Sulfate) 325 (65 Fe) MG Tabs Take 325 mg by mouth in the morning and at bedtime. What changed:  medication strength how much to take when to take this additional instructions   multivitamin with minerals Tabs tablet Take 1 tablet by mouth daily. Centrum Silver   valACYclovir 1000 MG tablet Commonly  known as: VALTREX Take 1,000 mg by mouth 3 (three) times daily.   vitamin C 1000 MG tablet Take 1,000 mg by mouth daily.   Vitamin D 50 MCG (2000 UT) tablet Take 2,000 Units by mouth daily.        Follow-up Information     Vyas, Dhruv B, MD. Schedule  an appointment as soon as possible for a visit in 1 week(s).   Specialty: Internal Medicine Contact information: [place] [address] [phone]                Feels fine Ready to go home  Discharge Exam: Filed Weights   07/20/22 0947 07/21/22 1359  Weight: 83.9 kg 83.9 kg   Physical Exam Vitals reviewed.  Constitutional:      General: She is not in acute distress.    Appearance: She is not ill-appearing or toxic-appearing.  Cardiovascular:     Rate and Rhythm: Normal rate and regular rhythm.     Heart sounds: No murmur heard. Pulmonary:     Effort: Pulmonary effort is normal. No respiratory distress.     Breath sounds: No wheezing, rhonchi or rales.  Neurological:     Mental Status: She is alert.  Psychiatric:        Mood and Affect: Mood normal.        Behavior: Behavior normal.      Condition at discharge: good  The results of significant diagnostics from this hospitalization (including imaging, microbiology, ancillary and laboratory) are listed below for reference.   Imaging Studies: No results found.  Microbiology: Results for orders placed or performed during the hospital encounter of 06/09/20  SARS CORONAVIRUS 2 (TAT 6-24 HRS) Nasopharyngeal Nasopharyngeal Swab     Status: None   Collection Time: 06/09/20  8:02 AM   Specimen: Nasopharyngeal Swab  Result Value Ref Range Status   SARS Coronavirus 2 NEGATIVE NEGATIVE Final    Comment: (NOTE) SARS-CoV-2 target nucleic acids are NOT DETECTED.  The SARS-CoV-2 RNA is generally detectable in upper and lower respiratory specimens during the acute phase of infection. Negative results do not preclude SARS-CoV-2 infection, do not rule out co-infections with other pathogens, and should not be used as the sole basis for treatment or other patient management decisions. Negative results must be combined with clinical observations, patient history, and epidemiological information. The  expected result is Negative.  Fact Sheet for Patients: SugarRoll.be  Fact Sheet for Healthcare Providers: https://www.woods-mathews.com/  This test is not yet approved or cleared by the [place] FDA and  has been authorized for detection and/or diagnosis of SARS-CoV-2 by FDA under an Emergency Use Authorization (EUA). This EUA will remain  in effect (meaning this test can be used) for the duration of the COVID-19 declaration under Se ction 564(b)(1) of the Act, 21 U.S.C. section 360bbb-3(b)(1), unless the authorization is terminated or revoked sooner.  Performed at [facility], [place] [address] [place], [place] [address]     Labs: CBC: Recent Labs  Lab 07/19/22 1406 07/20/22 1020 07/21/22 0759 07/22/22 0857  WBC  --  6.2 7.4 6.4  NEUTROABS  --  4.5  --   --   HGB 5.9* 5.2* 8.5* 8.2*  HCT 20.0* 18.7* 26.8* 27.5*  MCV  --  83.9 83.5 85.9  PLT  --  265 267 [id number]   Basic Metabolic Panel: Recent Labs  Lab 07/20/22 1020 07/21/22 0759  NA 137 138  K 4.3 3.8  CL 108 108  CO2 22 22  GLUCOSE 112* 97  BUN 19 15  CREATININE 1.17* 1.03*  CALCIUM 8.2* 8.2*   Liver Function Tests: No results for input(s): "AST", "ALT", "ALKPHOS", "BILITOT", "PROT", "ALBUMIN" in the last 168 hours. CBG: No results for input(s): "GLUCAP" in the last 168 hours.  Discharge time spent: less than 30 minutes.  Signed: Murray Hodgkins, MD [facility] 07/23/2022

## 2022-07-23 NOTE — Plan of Care (Signed)
  Problem: Education: Goal: Knowledge of General Education information will improve Description Including pain rating scale, medication(s)/side effects and non-pharmacologic comfort measures Outcome: Progressing   Problem: Health Behavior/Discharge Planning: Goal: Ability to manage health-related needs will improve Outcome: Progressing   

## 2022-07-23 NOTE — Plan of Care (Signed)
  Problem: Education: Goal: Knowledge of General Education information will improve Description: Including pain rating scale, medication(s)/side effects and non-pharmacologic comfort measures 07/23/2022 1054 by Santa Lighter, RN Outcome: Adequate for Discharge 07/23/2022 0823 by Santa Lighter, RN Outcome: Progressing   Problem: Health Behavior/Discharge Planning: Goal: Ability to manage health-related needs will improve 07/23/2022 1054 by Santa Lighter, RN Outcome: Adequate for Discharge 07/23/2022 0823 by Santa Lighter, RN Outcome: Progressing   Problem: Clinical Measurements: Goal: Ability to maintain clinical measurements within normal limits will improve Outcome: Adequate for Discharge Goal: Will remain free from infection Outcome: Adequate for Discharge Goal: Diagnostic test results will improve Outcome: Adequate for Discharge Goal: Respiratory complications will improve Outcome: Adequate for Discharge Goal: Cardiovascular complication will be avoided Outcome: Adequate for Discharge   Problem: Activity: Goal: Risk for activity intolerance will decrease Outcome: Adequate for Discharge   Problem: Nutrition: Goal: Adequate nutrition will be maintained Outcome: Adequate for Discharge   Problem: Coping: Goal: Level of anxiety will decrease Outcome: Adequate for Discharge   Problem: Elimination: Goal: Will not experience complications related to bowel motility Outcome: Adequate for Discharge Goal: Will not experience complications related to urinary retention Outcome: Adequate for Discharge   Problem: Pain Managment: Goal: General experience of comfort will improve Outcome: Adequate for Discharge   Problem: Safety: Goal: Ability to remain free from injury will improve Outcome: Adequate for Discharge   Problem: Skin Integrity: Goal: Risk for impaired skin integrity will decrease Outcome: Adequate for Discharge

## 2022-07-23 NOTE — Brief Op Note (Addendum)
07/20/2022 - 07/23/2022  9:45 AM  PATIENT:  Valerie Griffin  89 y.o. female  PRE-OPERATIVE DIAGNOSIS:  acute on chronic anemia, melena  POST-OPERATIVE DIAGNOSIS:  diverticulosis;ascending colon polypsx2(cold,biopsy;cold snare);transverse colon polypsx2(cold snares)descending colon polyp(cold snare);  PROCEDURE:  Procedure(s): COLONOSCOPY WITH PROPOFOL (N/A) POLYPECTOMY  SURGEON:  Surgeon(s) and Role:    * Harvel Quale, MD - Primary  Patient underwent colonoscopy under propofol sedation.  Tolerated the procedure adequately.    - Capsule endoscopy in the cecum.  - Three 4 to 10 mm polyps in the transverse colon and in the ascending colon, removed with a cold snare.  Resected and retrieved.  - One 1 mm polyp in the ascending colon, removed with a cold biopsy forceps.  Resected and retrieved.  - Two 4 to 6 mm polyps in the descending colon, removed with a cold snare.  Resected and retrieved.  - Diverticulosis in the sigmoid colon.  - The distal rectum and anal verge are normal on retroflexion view.   RECOMMENDATIONS - Return patient to hospital ward for possible discharge same day.  - Resume previous diet.  - Check H/H in one week. - Hematology outpatient evaluation. - Ferrous sulfate 325 mg twice a day. - Pantoprazole 40 mg BID.  [name], MD Gastroenterology and Hepatology [facility] Gastroenterology

## 2022-07-23 NOTE — Progress Notes (Signed)
We will proceed with colonoscopy as scheduled.  I thoroughly discussed with the patient the procedure, including the risks involved. Patient understands what the procedure involves including the benefits and any risks. Patient understands alternatives to the proposed procedure. Risks including (but not limited to) bleeding, tearing of the lining (perforation), rupture of adjacent organs, problems with heart and lung function, infection, and medication reactions. A small percentage of complications may require surgery, hospitalization, repeat endoscopic procedure, and/or transfusion.  Patient understood and agreed.  [name], MD Gastroenterology and Hepatology [facility] Gastroenterology

## 2022-07-23 NOTE — Op Note (Addendum)
[facility] Patient Name: Valerie Griffin Procedure Date: 07/23/2022 8:31 AM MRN: [id number] Date of Birth: 20-Apr-1934 Attending MD: [name] , , [id number] CSN: [id number] Age: 89 Admit Type: Inpatient Procedure:                Colonoscopy Indications:              Melena, Anemia, Normal capsule endoscopy Providers:                [name], Lurline Del, RN, Casimer Bilis, Technician Referring MD:              Medicines:                Monitored Anesthesia Care Complications:            No immediate complications. Estimated Blood Loss:     Estimated blood loss: none. Procedure:                Pre-Anesthesia Assessment:                           - Prior to the procedure, a History and Physical                            was performed, and patient medications, allergies                            and sensitivities were reviewed. The patient's                            tolerance of previous anesthesia was reviewed.                           - The risks and benefits of the procedure and the                            sedation options and risks were discussed with the                            patient. All questions were answered and informed                            consent was obtained.                           - ASA Grade Assessment: III - A patient with severe                            systemic disease.                           After obtaining informed consent, the colonoscope                            was passed under direct vision. Throughout the  procedure, the patient's blood pressure, pulse, and                            oxygen saturations were monitored continuously. The                            [id number]-[id number] [id number]) scope was introduced through                            the anus and advanced to the the cecum, identified                            by appendiceal orifice and ileocecal valve.  The                            colonoscopy was performed without difficulty. The                            patient tolerated the procedure well. The quality                            of the bowel preparation was adequate to identify                            polyps greater than 5 mm in size. Scope In: 9:12:13 AM Scope Out: 9:34:50 AM Scope Withdrawal Time: 0 hours 15 minutes 39 seconds  Total Procedure Duration: 0 hours 22 minutes 37 seconds  Findings:      The perianal and digital rectal examinations were normal.      A foreign body (capsule endoscopy) was found in the cecum.      Three sessile polyps were found in the transverse colon and ascending       colon. The polyps were 4 to 10 mm in size. These polyps were removed       with a cold snare. Resection and retrieval were complete.      A 1 mm polyp was found in the ascending colon. The polyp was sessile.       The polyp was removed with a cold biopsy forceps. Resection and       retrieval were complete.      Two sessile polyps were found in the descending colon. The polyps were 4       to 6 mm in size. These polyps were removed with a cold snare. Resection       and retrieval were complete.      Multiple large-mouthed and small-mouthed diverticula were found in the       sigmoid colon.      The retroflexed view of the distal rectum and anal verge was normal and       showed no anal or rectal abnormalities. Impression:               - Capsule endoscopy in the cecum.                           - Three 4 to 10 mm polyps in the transverse colon  and in the ascending colon, removed with a cold                            snare. Resected and retrieved.                           - One 1 mm polyp in the ascending colon, removed                            with a cold biopsy forceps. Resected and retrieved.                           - Two 4 to 6 mm polyps in the descending colon,                             removed with a cold snare. Resected and retrieved.                           - Diverticulosis in the sigmoid colon.                           - The distal rectum and anal verge are normal on                            retroflexion view. Moderate Sedation:      Per Anesthesia Care Recommendation:           - Return patient to hospital ward for possible                            discharge same day.                           - Resume previous diet.                           - Check H/H in one week.                           - Hematology outpatient evaluation.                           - Ferrous sulfate 325 mg twice a day.                           - Pantoprazole 40 mg BID.                           - Repeat colonoscopy is not recommended due to                            current age (40 years or older) for screening                            purposes. Procedure Code(s):        ---  Professional ---                           [phone], Colonoscopy, flexible; with removal of                            tumor(s), polyp(s), or other lesion(s) by snare                            technique                           [id number], 59, Colonoscopy, flexible; with biopsy,                            single or multiple Diagnosis Code(s):        --- Professional ---                           [id number], Foreign body in colon, initial encounter                           D12.3, Benign neoplasm of transverse colon (hepatic                            flexure or splenic flexure)                           D12.2, Benign neoplasm of ascending colon                           D12.4, Benign neoplasm of descending colon                           K92.1, Melena (includes Hematochezia)                           D64.9, Anemia, unspecified                           K57.30, Diverticulosis of large intestine without                            perforation or abscess without bleeding CPT copyright 2022 American Medical Association.  All rights reserved. The codes documented in this report are preliminary and upon coder review may  be revised to meet current compliance requirements. [name], MD [name],  07/23/2022 9:46:53 AM This report has been signed electronically. Number of Addenda: 0

## 2022-07-26 LAB — SURGICAL PATHOLOGY

## 2022-07-27 ENCOUNTER — Encounter: Payer: Medicare Other | Admitting: Hematology

## 2022-07-29 ENCOUNTER — Encounter (HOSPITAL_COMMUNITY): Payer: Medicare Other

## 2022-08-02 ENCOUNTER — Ambulatory Visit (HOSPITAL_COMMUNITY): Admit: 2022-08-02 | Payer: Medicare Other | Admitting: Gastroenterology

## 2022-08-02 ENCOUNTER — Encounter (HOSPITAL_COMMUNITY): Payer: Self-pay

## 2022-08-02 SURGERY — ESOPHAGOGASTRODUODENOSCOPY (EGD) WITH PROPOFOL
Anesthesia: Monitor Anesthesia Care

## 2022-08-04 ENCOUNTER — Encounter (HOSPITAL_COMMUNITY): Payer: Self-pay | Admitting: Internal Medicine

## 2022-08-05 ENCOUNTER — Inpatient Hospital Stay: Payer: Medicare Other

## 2022-08-05 ENCOUNTER — Inpatient Hospital Stay: Payer: Medicare Other | Attending: Hematology | Admitting: Hematology

## 2022-08-05 VITALS — BP 135/74 | HR 70 | Temp 98.4°F | Resp 18

## 2022-08-05 DIAGNOSIS — K635 Polyp of colon: Secondary | ICD-10-CM | POA: Diagnosis not present

## 2022-08-05 DIAGNOSIS — Z86711 Personal history of pulmonary embolism: Secondary | ICD-10-CM | POA: Diagnosis not present

## 2022-08-05 DIAGNOSIS — K5730 Diverticulosis of large intestine without perforation or abscess without bleeding: Secondary | ICD-10-CM | POA: Insufficient documentation

## 2022-08-05 DIAGNOSIS — D649 Anemia, unspecified: Secondary | ICD-10-CM

## 2022-08-05 DIAGNOSIS — Z90710 Acquired absence of both cervix and uterus: Secondary | ICD-10-CM | POA: Insufficient documentation

## 2022-08-05 DIAGNOSIS — D508 Other iron deficiency anemias: Secondary | ICD-10-CM | POA: Diagnosis not present

## 2022-08-05 DIAGNOSIS — D500 Iron deficiency anemia secondary to blood loss (chronic): Secondary | ICD-10-CM | POA: Diagnosis present

## 2022-08-05 LAB — RETICULOCYTES
Immature Retic Fract: 30 % — ABNORMAL HIGH (ref 2.3–15.9)
RBC.: 3.68 MIL/uL — ABNORMAL LOW (ref 3.87–5.11)
Retic Count, Absolute: 52.6 10*3/uL (ref 19.0–186.0)
Retic Ct Pct: 1.4 % (ref 0.4–3.1)

## 2022-08-05 LAB — CBC
HCT: 32.4 % — ABNORMAL LOW (ref 36.0–46.0)
Hemoglobin: 9.6 g/dL — ABNORMAL LOW (ref 12.0–15.0)
MCH: 26.4 pg (ref 26.0–34.0)
MCHC: 29.6 g/dL — ABNORMAL LOW (ref 30.0–36.0)
MCV: 89.3 fL (ref 80.0–100.0)
Platelets: 335 10*3/uL (ref 150–400)
RBC: 3.63 MIL/uL — ABNORMAL LOW (ref 3.87–5.11)
RDW: 21.2 % — ABNORMAL HIGH (ref 11.5–15.5)
WBC: 5.5 10*3/uL (ref 4.0–10.5)
nRBC: 0 % (ref 0.0–0.2)

## 2022-08-05 LAB — FERRITIN: Ferritin: 20 ng/mL (ref 11–307)

## 2022-08-05 LAB — SAMPLE TO BLOOD BANK

## 2022-08-05 LAB — IRON AND TIBC
Iron: 107 ug/dL (ref 28–170)
Saturation Ratios: 23 % (ref 10.4–31.8)
TIBC: 459 ug/dL — ABNORMAL HIGH (ref 250–450)
UIBC: 352 ug/dL

## 2022-08-05 LAB — DIRECT ANTIGLOBULIN TEST (NOT AT ARMC)
DAT, IgG: POSITIVE
DAT, complement: NEGATIVE

## 2022-08-05 LAB — LACTATE DEHYDROGENASE: LDH: 218 U/L — ABNORMAL HIGH (ref 98–192)

## 2022-08-05 LAB — VITAMIN B12: Vitamin B-12: 1040 pg/mL — ABNORMAL HIGH (ref 180–914)

## 2022-08-05 LAB — FOLATE: Folate: 26.6 ng/mL (ref >5.9)

## 2022-08-05 NOTE — Progress Notes (Signed)
[facility] [address], [place] [address]   Clinic Day:  08/05/2022  Referring physician: Samuella Cota, MD  Patient Care Team: Derek Jack, MD as PCP - General (Hematology) Derek Jack, MD as Medical Oncologist (Hematology)   ASSESSMENT & PLAN:   Assessment:  1.  Severe normocytic anemia: - Patient seen at the request of Dr. Sarajane Jews - CBC on 07/20/2022: Hb-5.2, MCV-83.9, status post 2 units PRBC. - Colonoscopy (07/23/2022): 4 to 10 mm polyps in the transverse colon, ascending and descending colon removed.  Diverticulosis in the sigmoid colon.  Distal rectum and anal was normal. 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  Large hiatal hernia.  Normal duodenum. -Capsule study: Review frequently obscured by dark effluent/melena and more distally scattered debris and fecal material.  Evidence of duodenitis and patchy erythema throughout the small bowel.  Mild oozing likely secondary to scope for EGD (this area likely at the area of peptic stricture).  No obvious upper or middle bowel source of melena. - Previously seen at [facility] by Dr. Federico Flake, received Feraheme on 06/04/2020, 06/15/2020 - Previously received PRBC x 2 at [facility]. - She is taking oral iron therapy for many years.  Denies any ice pica.  2.  Social/family history: - She lives at home with her son.  Walks with the help of walker.  She is able to wash dishes, and occasionally cooks.  Prior to retirement, she did secretarial work in an office.  Never smoker.  She is accompanied by her son today. - No family history of anemia.  Daughter had anal cancer.  Father had prostate cancer.  Great grandfather had cancer.  Son died of stomach cancer.  3.  Pulmonary embolism: - Diagnosis in 07/2020.  Treated with 6 months of Xarelto.  Unclear by history whether it was provoked or unprovoked.  Plan:  1.  Severe  normocytic anemia: - She reported black stools x 2 since discharge from the hospital.  She is no longer taking baby aspirin. 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 - Will check her CBC, for nutritional deficiencies and hemolysis.  Will also do labs for bone marrow infiltrative process. - RTC 1 to 2 weeks for follow-up.  Will schedule for Feraheme x 2, first infusion on the day of return visit.   Orders Placed This Encounter  Procedures   CBC    Standing Status:   Future    Number of Occurrences:   1    Standing Expiration Date:   08/05/2023   Reticulocytes    Standing Status:   Future    Number of Occurrences:   1    Standing Expiration Date:   08/05/2023   Lactate dehydrogenase    Standing Status:   Future    Number of Occurrences:   1    Standing Expiration Date:   08/05/2023    Order Specific Question:   Release to patient    Answer:   Immediate   Ferritin    Standing Status:   Future    Number of Occurrences:   1    Standing Expiration Date:   08/05/2023    Order Specific Question:   Release to patient    Answer:   Immediate   Iron and TIBC    Standing Status:   Future    Number of Occurrences:   1    Standing  Expiration Date:   08/05/2023    Order Specific Question:   Release to patient    Answer:   Immediate   Vitamin B12    Standing Status:   Future    Number of Occurrences:   1    Standing Expiration Date:   08/05/2023    Order Specific Question:   Release to patient    Answer:   Immediate   Folate    Standing Status:   Future    Number of Occurrences:   1    Standing Expiration Date:   08/05/2023    Order Specific Question:   Release to patient    Answer:   Immediate   Methylmalonic acid, serum    Standing Status:   Future    Number of Occurrences:   1    Standing Expiration Date:   08/05/2023   Copper, serum    Standing Status:   Future    Number of Occurrences:   1    Standing  Expiration Date:   08/05/2023   Haptoglobin    Standing Status:   Future    Number of Occurrences:   1    Standing Expiration Date:   08/05/2023   Protein electrophoresis, serum    Standing Status:   Future    Number of Occurrences:   1    Standing Expiration Date:   08/05/2023    Order Specific Question:   Release to patient    Answer:   Immediate   Kappa/lambda light chains    Standing Status:   Future    Number of Occurrences:   1    Standing Expiration Date:   08/05/2023   Immunofixation electrophoresis    Standing Status:   Future    Number of Occurrences:   1    Standing Expiration Date:   08/05/2023    Order Specific Question:   Release to patient    Answer:   Immediate   Direct antiglobulin test    Standing Status:   Future    Number of Occurrences:   1    Standing Expiration Date:   08/05/2023   Sample to Blood Bank(Blood Bank Hold)    Standing Status:   Future    Number of Occurrences:   1    Standing Expiration Date:   08/05/2023      I,Alexis Herring,acting as a Education administrator for [employer], MD.,have documented all relevant documentation on the behalf of Derek Jack, MD,as directed by  Derek Jack, MD while in the presence of Derek Jack, MD.   I, Derek Jack MD, have reviewed the above documentation for accuracy and completeness, and I agree with the above.   Derek Jack, MD   3/15/20241:36 PM  CHIEF COMPLAINT/PURPOSE OF CONSULT:   Diagnosis: Iron deficiency anemia from blood loss  Current Therapy: Feraheme  HISTORY OF PRESENT ILLNESS:   Valerie Griffin is a 89 y.o. female presenting to clinic today for evaluation of anemia at the request of Dr. Murray Hodgkins.  Patient was admitted to the hospital from 2/28-07/23/22 for symptomatic anemia. She had a GI consult with EGD and colonoscopy.  Per review of Dr. Everett Graff discharge summary notes from 07/23/22, GI work-up revealed: EGD showed noncritical peptic stricture with   associated pseudo diverticula Video capsule placed, View frequently obscured my dark effluent/melena and more distally scattered debris and fecal material. Evidence of duodenitis and patchy erythema throughout the small bowel. Mild oozing likely secondary to scope for EGD (this area  is likely at the area of peptic stricture). No obvious upper or mid bowel source of melena. Colonoscopy.  Polyps removed. Biopsy showed tubular adenomas but no high grade dysplasia or carcinoma.  Her HGB on arrival was 5.2 and improved to 8.2 with 2 units  PRBC. Her ASA was discontinued and she was started on BID PPI and iron sulfate BID.  Today, she states that she is doing well overall. Her appetite level is at 50%. Her energy level is at 50%.  She denies any ice pica.  She is off of baby aspirin.  She had 2 more episodes of melena since discharge from the hospital.  PAST MEDICAL HISTORY:   Past Medical History: Past Medical History:  Diagnosis Date   Anemia    Arthritis    Constipation    Dysphagia 08/16/2016   Esophageal stenosis    GERD (gastroesophageal reflux disease)     Surgical History: Past Surgical History:  Procedure Laterality Date   ABDOMINAL HYSTERECTOMY     APPENDECTOMY     BALLOON DILATION N/A 06/11/2020   Procedure: BALLOON DILATION;  Surgeon: Rogene Houston, MD;  [facility];  Service: Endoscopy;  Laterality: N/A;   broken arm and wrist with plate and scews rt wrist Right    CHOLECYSTECTOMY     COLONOSCOPY     COLONOSCOPY WITH PROPOFOL N/A 07/23/2022   Procedure: COLONOSCOPY WITH PROPOFOL;  Surgeon: Harvel Quale, MD;  [facility];  Service: Gastroenterology;  Laterality: N/A;   complete hysterectomy'     fibroid tumors bleeding   ESOPHAGEAL DILATION N/A 08/22/2016   Procedure: ESOPHAGEAL DILATION;  Surgeon: Rogene Houston, MD;  [facility];  Service: Endoscopy;  Laterality: N/A;   ESOPHAGEAL DILATION N/A 09/14/2016   Procedure:  ESOPHAGEAL DILATION;  Surgeon: Rogene Houston, MD;  [facility];  Service: Endoscopy;  Laterality: N/A;   ESOPHAGEAL DILATION N/A 07/26/2017   Procedure: ESOPHAGEAL DILATION;  Surgeon: Rogene Houston, MD;  [facility];  Service: Endoscopy;  Laterality: N/A;   ESOPHAGEAL DILATION N/A 08/31/2017   Procedure: ESOPHAGEAL DILATION;  Surgeon: Rogene Houston, MD;  [facility];  Service: Endoscopy;  Laterality: N/A;   ESOPHAGEAL DILATION N/A 10/26/2017   Procedure: ESOPHAGEAL DILATION;  Surgeon: Rogene Houston, MD;  [facility];  Service: Endoscopy;  Laterality: N/A;   ESOPHAGEAL DILATION N/A 02/07/2018   Procedure: ESOPHAGEAL DILATION;  Surgeon: Rogene Houston, MD;  [facility];  Service: Endoscopy;  Laterality: N/A;   ESOPHAGEAL DILATION N/A 05/31/2019   Procedure: ESOPHAGEAL DILATION;  Surgeon: Rogene Houston, MD;  [facility];  Service: Endoscopy;  Laterality: N/A;   ESOPHAGEAL DILATION N/A 06/14/2019   Procedure: ESOPHAGEAL DILATION;  Surgeon: Rogene Houston, MD;  [facility];  Service: Endoscopy;  Laterality: N/A;   ESOPHAGEAL DILATION N/A 07/04/2019   Procedure: ESOPHAGEAL DILATION;  Surgeon: Rogene Houston, MD;  [facility];  Service: Endoscopy;  Laterality: N/A;  730   ESOPHAGEAL DILATION N/A 08/01/2019   Procedure: ESOPHAGEAL DILATION;  Surgeon: Rogene Houston, MD;  [facility];  Service: Endoscopy;  Laterality: N/A;   ESOPHAGEAL DILATION N/A 05/14/2020   Procedure: ESOPHAGEAL DILATION;  Surgeon: Rogene Houston, MD;  [facility];  Service: Endoscopy;  Laterality: N/A;   ESOPHAGOGASTRODUODENOSCOPY N/A 08/22/2016   Procedure: ESOPHAGOGASTRODUODENOSCOPY (EGD);  Surgeon: Rogene Houston, MD;  [facility];  Service: Endoscopy;  Laterality: N/A;   ESOPHAGOGASTRODUODENOSCOPY N/A 09/14/2016   Procedure: ESOPHAGOGASTRODUODENOSCOPY (EGD);  Surgeon: Rogene Houston,  MD;  [facility];  Service: Endoscopy;  Laterality: N/A;  1225   ESOPHAGOGASTRODUODENOSCOPY N/A 07/26/2017   Procedure: ESOPHAGOGASTRODUODENOSCOPY (EGD);  Surgeon: Rogene Houston, MD;  [facility];  Service: Endoscopy;  Laterality: N/A;  1:45   ESOPHAGOGASTRODUODENOSCOPY N/A 08/31/2017   Procedure: ESOPHAGOGASTRODUODENOSCOPY (EGD);  Surgeon: Rogene Houston, MD;  [facility];  Service: Endoscopy;  Laterality: N/A;   ESOPHAGOGASTRODUODENOSCOPY N/A 10/26/2017   Procedure: ESOPHAGOGASTRODUODENOSCOPY (EGD);  Surgeon: Rogene Houston, MD;  [facility];  Service: Endoscopy;  Laterality: N/A;  1115   ESOPHAGOGASTRODUODENOSCOPY N/A 02/07/2018   Procedure: ESOPHAGOGASTRODUODENOSCOPY (EGD);  Surgeon: Rogene Houston, MD;  [facility];  Service: Endoscopy;  Laterality: N/A;  830   ESOPHAGOGASTRODUODENOSCOPY N/A 07/04/2019   Procedure: ESOPHAGOGASTRODUODENOSCOPY (EGD);  Surgeon: Rogene Houston, MD;  [facility];  Service: Endoscopy;  Laterality: N/A;  730   ESOPHAGOGASTRODUODENOSCOPY N/A 08/01/2019   Procedure: ESOPHAGOGASTRODUODENOSCOPY (EGD);  Surgeon: Rogene Houston, MD;  [facility];  Service: Endoscopy;  Laterality: N/A;  125   ESOPHAGOGASTRODUODENOSCOPY N/A 05/14/2020   Procedure: ESOPHAGOGASTRODUODENOSCOPY (EGD);  Surgeon: Rogene Houston, MD;  [facility];  Service: Endoscopy;  Laterality: N/A;  10:30   ESOPHAGOGASTRODUODENOSCOPY N/A 06/11/2020   Procedure: ESOPHAGOGASTRODUODENOSCOPY (EGD);  Surgeon: Rogene Houston, MD;  [facility];  Service: Endoscopy;  Laterality: N/A;  7:30   ESOPHAGOGASTRODUODENOSCOPY (EGD) WITH PROPOFOL N/A 05/31/2019   Procedure: ESOPHAGOGASTRODUODENOSCOPY (EGD) WITH PROPOFOL;  Surgeon: Rogene Houston, MD;  [facility];  Service: Endoscopy;  Laterality: N/A;  2:35   ESOPHAGOGASTRODUODENOSCOPY (EGD) WITH PROPOFOL N/A 06/14/2019   Procedure:  ESOPHAGOGASTRODUODENOSCOPY (EGD) WITH PROPOFOL;  Surgeon: Rogene Houston, MD;  [facility];  Service: Endoscopy;  Laterality: N/A;   ESOPHAGOGASTRODUODENOSCOPY (EGD) WITH PROPOFOL N/A 07/21/2022   Procedure: ESOPHAGOGASTRODUODENOSCOPY (EGD) WITH PROPOFOL;  Surgeon: Daneil Dolin, MD;  [facility];  Service: Endoscopy;  Laterality: N/A;   FOREIGN BODY REMOVAL  05/31/2019   Procedure: FOREIGN BODY REMOVAL;  Surgeon: Rogene Houston, MD;  [facility];  Service: Endoscopy;;   GIVENS CAPSULE STUDY N/A 07/21/2022   Procedure: GIVENS CAPSULE STUDY;  Surgeon: Daneil Dolin, MD;  [facility];  Service: Endoscopy;  Laterality: N/A;   POLYPECTOMY  07/23/2022   Procedure: POLYPECTOMY;  Surgeon: Harvel Quale, MD;  [facility];  Service: Gastroenterology;;    Social History: Social History   Socioeconomic History   Marital status: Widowed    Spouse name: Not on file   Number of children: Not on file   Years of education: Not on file   Highest education level: Not on file  Occupational History   Not on file  Tobacco Use   Smoking status: Never    Passive exposure: Current   Smokeless tobacco: Never  Vaping Use   Vaping Use: Never used  Substance and Sexual Activity   Alcohol use: No   Drug use: No   Sexual activity: Not on file  Other Topics Concern   Not on file  Social History Narrative   Not on file   Social Determinants of Health   Financial Resource Strain: Not on file  Food Insecurity: No Food Insecurity (08/05/2022)   Hunger Vital Sign  Worried About Charity fundraiser in the Last Year: Never true    [place] in the Last Year: Never true  Transportation Needs: No Transportation Needs (08/05/2022)   PRAPARE - Hydrologist (Medical): No    Lack of Transportation (Non-Medical): No  Physical Activity: Not on file  Stress: Not on file  Social Connections: Not on file   Intimate Partner Violence: Not At Risk (08/05/2022)   Humiliation, Afraid, Rape, and Kick questionnaire    Fear of Current or Ex-Partner: No    Emotionally Abused: No    Physically Abused: No    Sexually Abused: No    Family History: Family History  Problem Relation Age of Onset   Heart Problems Mother    Alzheimer's disease Mother    Hypertension Mother    Prostate cancer Father    Heart attack Father    Heart Problems Sister     Current Medications:  Current Outpatient Medications:    Ascorbic Acid (VITAMIN C) 1000 MG tablet, Take 1,000 mg by mouth daily., Disp: , Rfl:    Cholecalciferol (VITAMIN D) 50 MCG (2000 UT) tablet, Take 2,000 Units by mouth daily., Disp: , Rfl:    docusate sodium (COLACE) 100 MG capsule, Take 100 mg by mouth at bedtime., Disp: , Rfl:    esomeprazole (NEXIUM) 20 MG capsule, Take 1 capsule (20 mg total) by mouth 2 (two) times daily before a meal. (Patient taking differently: Take 20 mg by mouth daily before breakfast. Takes once daily. Gets OTC), Disp: , Rfl:    gabapentin (NEURONTIN) 300 MG capsule, Take 300 mg by mouth. Two capsules tid, Disp: , Rfl:    HYDROcodone-acetaminophen (NORCO/VICODIN) 5-325 MG tablet, Take 1 tablet by mouth every 6 (six) hours as needed for moderate pain. (Patient not taking: Reported on 07/20/2022), Disp: , Rfl:    Iron, Ferrous Sulfate, 325 (65 Fe) MG TABS, Take 325 mg by mouth in the morning and at bedtime., Disp: , Rfl:    Multiple Vitamin (MULTIVITAMIN WITH MINERALS) TABS tablet, Take 1 tablet by mouth daily. Centrum Silver, Disp: , Rfl:    valACYclovir (VALTREX) 1000 MG tablet, Take 1,000 mg by mouth 3 (three) times daily. (Patient not taking: Reported on 07/20/2022), Disp: , Rfl:    Allergies: Allergies  Allergen Reactions   Penicillins Rash and Other (See Comments)    40 years ago, caused rash and tachycardia Has patient had a PCN reaction causing immediate rash, facial/tongue/throat swelling, SOB or lightheadedness  with hypotension: No Has patient had a PCN reaction causing severe rash involving mucus membranes or skin necrosis: No Has patient had a PCN reaction that required hospitalization: No Has patient had a PCN reaction occurring within the last 10 years: No If all of the above answers are "NO", then may proceed with Cephalosporin use.     REVIEW OF SYSTEMS:   Review of Systems  Constitutional:  Negative for chills, fatigue and fever.  HENT:   Negative for lump/mass, mouth sores, nosebleeds, sore throat and trouble swallowing.   Eyes:  Negative for eye problems.  Respiratory:  Positive for shortness of breath. Negative for cough.   Cardiovascular:  Negative for chest pain, leg swelling and palpitations.  Gastrointestinal:  Positive for constipation. Negative for abdominal pain, diarrhea, nausea and vomiting.  Genitourinary:  Negative for bladder incontinence, difficulty urinating, dysuria, frequency, hematuria and nocturia.   Musculoskeletal:  Negative for arthralgias, back pain, flank pain, myalgias and neck pain.  Skin:  Negative for itching and rash.  Neurological:  Positive for numbness. Negative for dizziness and headaches.  Hematological:  Does not bruise/bleed easily.  Psychiatric/Behavioral:  Negative for depression, sleep disturbance and suicidal ideas. The patient is not nervous/anxious.   All other systems reviewed and are negative.    VITALS:   Blood pressure 135/74, pulse 70, temperature 98.4 F (36.9 C), temperature source Oral, resp. rate 18, SpO2 100 %.  Wt Readings from Last 3 Encounters:  07/21/22 184 lb 15.5 oz (83.9 kg)  07/19/22 184 lb 9.6 oz (83.7 kg)  11/11/21 187 lb 14.4 oz (85.2 kg)    There is no height or weight on file to calculate BMI.   PHYSICAL EXAM:   Physical Exam Vitals and nursing note reviewed. Exam conducted with a chaperone present.  Constitutional:      Appearance: Normal appearance.  Cardiovascular:     Rate and Rhythm: Normal rate and  regular rhythm.     Pulses: Normal pulses.     Heart sounds: Normal heart sounds.  Pulmonary:     Effort: Pulmonary effort is normal.     Breath sounds: Normal breath sounds.  Abdominal:     Palpations: Abdomen is soft. There is no hepatomegaly, splenomegaly or mass.     Tenderness: There is no abdominal tenderness.  Musculoskeletal:     Right lower leg: No edema.     Left lower leg: No edema.  Lymphadenopathy:     Cervical: No cervical adenopathy.     Right cervical: No superficial, deep or posterior cervical adenopathy.    Left cervical: No superficial, deep or posterior cervical adenopathy.     Upper Body:     Right upper body: No supraclavicular or axillary adenopathy.     Left upper body: No supraclavicular or axillary adenopathy.  Neurological:     General: No focal deficit present.     Mental Status: She is alert and oriented to person, place, and time.  Psychiatric:        Mood and Affect: Mood normal.        Behavior: Behavior normal.     LABS:      Latest Ref Rng & Units 07/22/2022    8:57 AM 07/21/2022    7:59 AM 07/20/2022   10:20 AM  CBC  WBC 4.0 - 10.5 K/uL 6.4  7.4  6.2   Hemoglobin 12.0 - 15.0 g/dL 8.2  8.5  5.2   Hematocrit 36.0 - 46.0 % 27.5  26.8  18.7   Platelets 150 - 400 K/uL 224  267  265       Latest Ref Rng & Units 07/21/2022    7:59 AM 07/20/2022   10:20 AM 05/09/2019    3:13 PM  CMP  Glucose 70 - 99 mg/dL 97  112  93   BUN 8 - 23 mg/dL 15  19  12    Creatinine 0.44 - 1.00 mg/dL 1.03  1.17  1.02   Sodium 135 - 145 mmol/L 138  137  141   Potassium 3.5 - 5.1 mmol/L 3.8  4.3  4.3   Chloride 98 - 111 mmol/L 108  108  107   CO2 22 - 32 mmol/L 22  22  23    Calcium 8.9 - 10.3 mg/dL 8.2  8.2  9.2   Total Protein 6.1 - 8.1 g/dL   6.8   Total Bilirubin 0.2 - 1.2 mg/dL   0.5   AST 10 - 35 U/L  19   ALT 6 - 29 U/L   9      No results found for: "CEA1", "CEA" / No results found for: "CEA1", "CEA" No results found for: "PSA1" No results found for:  "[id number]" No results found for: "CAN125"  No results found for: "TOTALPROTELP", "ALBUMINELP", "A1GS", "A2GS", "BETS", "BETA2SER", "GAMS", "MSPIKE", "SPEI" Lab Results  Component Value Date   TIBC 364 05/09/2019   FERRITIN 9 (L) 05/09/2019   IRONPCTSAT 4 (L) 05/09/2019   No results found for: "LDH"  Pathology 07/23/22: FINAL MICROSCOPIC DIAGNOSIS:   A. ASCENDING AND TRANSVERSE COLON, POLYPECTOMY:  Tubular adenoma, 8 fragments  Negative for high-grade dysplasia and carcinoma   B. DESCENDING COLON, POLYPECTOMY:  Tubular adenoma  Negative for high-grade dysplasia and carcinoma   STUDIES:   No results found.

## 2022-08-05 NOTE — Patient Instructions (Addendum)
[facility] [place]  Discharge Instructions  You were seen and examined today by Dr. Katragadda. Dr. Katragadda is a hematologist, meaning that he specializes in blood abnormalities. Dr. Katragadda discussed your past medical history, family history of cancers/blood conditions and the events that led to you being here today.  You were referred to Dr. Katragadda due to anemia. Dr. Katragadda has recommended additional labs today for further evaluation.  Follow-up as scheduled.  Thank you for choosing [facility] [place] to provide your oncology and hematology care.   To afford each patient quality time with our provider, please arrive at least 15 minutes before your scheduled appointment time. You may need to reschedule your appointment if you arrive late (10 or more minutes). Arriving late affects you and other patients whose appointments are after yours.  Also, if you miss three or more appointments without notifying the office, you may be dismissed from the clinic at the provider's discretion.    Again, thank you for choosing [facility].  Our hope is that these requests will decrease the amount of time that you wait before being seen by our physicians.   If you have a lab appointment with the Cancer Center please come in thru the Main Entrance and check in at the main information desk.           _____________________________________________________________  Should you have questions after your visit to [facility], please contact our office at [phone] and follow the prompts.  Our office hours are 8:00 a.m. to 4:30 p.m. Monday - Thursday and 8:00 a.m. to 2:30 p.m. Friday.  Please note that voicemails left after 4:00 p.m. may not be returned until the following business day.  We are closed weekends and all major holidays.  You do have access to a nurse 24-7, just call the main number to the clinic [phone] and do not  press any options, hold on the line and a nurse will answer the phone.    For prescription refill requests, have your pharmacy contact our office and allow 72 hours.    Masks are optional in the cancer centers. If you would like for your care team to wear a mask while they are taking care of you, please let them know. You may have one support person who is at least 87 years old accompany you for your appointments.  

## 2022-08-07 LAB — HAPTOGLOBIN: Haptoglobin: <10 mg/dL — ABNORMAL LOW (ref 41–333)

## 2022-08-08 LAB — KAPPA/LAMBDA LIGHT CHAINS
Kappa free light chain: 40.1 mg/L — ABNORMAL HIGH (ref 3.3–19.4)
Kappa, lambda light chain ratio: 1.54 (ref 0.26–1.65)
Lambda free light chains: 26 mg/L (ref 5.7–26.3)

## 2022-08-08 LAB — COPPER, SERUM: Copper: 145 ug/dL (ref 80–158)

## 2022-08-10 LAB — PROTEIN ELECTROPHORESIS, SERUM
A/G Ratio: 1.1 (ref 0.7–1.7)
Albumin ELP: 3.6 g/dL (ref 2.9–4.4)
Alpha-1-Globulin: 0.3 g/dL (ref 0.0–0.4)
Alpha-2-Globulin: 0.5 g/dL (ref 0.4–1.0)
Beta Globulin: 1.3 g/dL (ref 0.7–1.3)
Gamma Globulin: 1.1 g/dL (ref 0.4–1.8)
Globulin, Total: 3.2 g/dL (ref 2.2–3.9)
Total Protein ELP: 6.8 g/dL (ref 6.0–8.5)

## 2022-08-11 LAB — IMMUNOFIXATION ELECTROPHORESIS
IgA: 350 mg/dL (ref 64–422)
IgG (Immunoglobin G), Serum: 1152 mg/dL (ref 586–1602)
IgM (Immunoglobulin M), Srm: 42 mg/dL (ref 26–217)
Total Protein ELP: 6.7 g/dL (ref 6.0–8.5)

## 2022-08-11 LAB — METHYLMALONIC ACID, SERUM: Methylmalonic Acid, Quantitative: 342 nmol/L (ref 0–378)

## 2022-08-18 ENCOUNTER — Inpatient Hospital Stay: Payer: Medicare Other

## 2022-08-18 ENCOUNTER — Inpatient Hospital Stay: Payer: Medicare Other | Admitting: Oncology

## 2022-08-18 VITALS — BP 160/65 | HR 72 | Temp 97.2°F | Resp 16

## 2022-08-18 DIAGNOSIS — D500 Iron deficiency anemia secondary to blood loss (chronic): Secondary | ICD-10-CM | POA: Diagnosis not present

## 2022-08-18 DIAGNOSIS — D508 Other iron deficiency anemias: Secondary | ICD-10-CM

## 2022-08-18 DIAGNOSIS — D649 Anemia, unspecified: Secondary | ICD-10-CM | POA: Diagnosis not present

## 2022-08-18 MED ORDER — ACETAMINOPHEN 325 MG PO TABS
650.0000 mg | ORAL_TABLET | Freq: Once | ORAL | Status: AC
  Administered 2022-08-18: 650 mg via ORAL
  Filled 2022-08-18: qty 2

## 2022-08-18 MED ORDER — CETIRIZINE HCL 10 MG PO TABS
10.0000 mg | ORAL_TABLET | Freq: Once | ORAL | Status: AC
  Administered 2022-08-18: 10 mg via ORAL
  Filled 2022-08-18: qty 1

## 2022-08-18 MED ORDER — SODIUM CHLORIDE 0.9 % IV SOLN: Freq: Once | INTRAVENOUS | Status: AC

## 2022-08-18 MED ORDER — SODIUM CHLORIDE 0.9 % IV SOLN
510.0000 mg | Freq: Once | INTRAVENOUS | Status: AC
  Administered 2022-08-18: 510 mg via INTRAVENOUS
  Filled 2022-08-18: qty 510

## 2022-08-18 NOTE — Progress Notes (Signed)
[facility] [address], [place] [address]   Clinic Day:  08/18/2022  Referring physician: Glenda Chroman, MD  Patient Care Team: Derek Jack, MD as PCP - General (Hematology) Derek Jack, MD as Medical Oncologist (Hematology)   ASSESSMENT & PLAN:   Assessment:  1.  Severe normocytic anemia: - Patient seen at the request of Dr. Sarajane Jews - CBC on 07/20/2022: Past 5.2, MCV-83.9, status post 2 units PRBC. - Colonoscopy (07/23/2022): 4 to 10 mm polyps in the transverse colon, ascending and descending colon removed.  Diverticulosis in the sigmoid colon.  Distal rectum and anal was normal. 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  Large hiatal hernia.  Normal duodenum. -Capsule study: Review frequently obscured by dark effluent/melena and more distally scattered debris and fecal material.  Evidence of duodenitis and patchy erythema throughout the small bowel.  Mild oozing likely secondary to scope for EGD (this area likely at the area of peptic stricture).  No obvious upper or middle bowel source of melena. - Previously seen at [facility] by Dr. Federico Flake, received Feraheme on 06/04/2020, 06/15/2020 - Previously received PRBC x 2 at [facility]. - She is taking oral iron therapy for many years.  Denies any ice pica.  2.  Social/family history: - She lives at home with her son.  Walks with the help of walker.  She is able to wash dishes, and occasionally cooks.  Prior to retirement, she did secretarial work in an office.  Never smoker.  She is accompanied by her son today. - No family history of anemia.  Daughter had anal cancer.  Father had prostate cancer.  Great grandfather had cancer.  Son died of stomach cancer.  3.  Pulmonary embolism: - Diagnosis in 07/2020.  Treated with 6 months of Xarelto.  Unclear by history whether it was provoked or unprovoked.  Plan:  1.  Severe  normocytic anemia: -Reviewed lab work with patient. -Hemoglobin is 9.6 with a ferritin of 20 and iron saturation to 23%.  Recommend Feraheme x 2 doses with first dose being today.  Continue oral iron at home if tolerating well. -No other nutritional deficiencies per lab work. -Protein electrophoresis WNL.  Immunoglobulins normal.  Reticulocyte count normal.  Haptoglobin low <10.  -Discussed case with Dr. Delton Coombes who recommends completing 2 doses of IV iron and to have her to return to clinic a few weeks later for repeat labs. -If no improvement, recommend adding trial of prednisone for hemolytic anemia. -Patient and son agreeable with plan.  They know to call clinic if anything changes and needs to be seen sooner. -Return to clinic in 1 week for 1 additional IV Feraheme.  Return to clinic in 3 weeks to see Dr. Delton Coombes and labs.   No orders of the defined types were placed in this encounter.   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 female presenting to clinic today for evaluation of anemia at the request of Dr. Murray Hodgkins.  Patient was admitted to the hospital from 2/28-07/23/22 for symptomatic anemia. She had a GI consult with EGD and colonoscopy.  Per review of Dr. Everett Graff discharge summary notes from 07/23/22, GI work-up revealed: EGD showed noncritical peptic stricture with  associated pseudo diverticula Video capsule placed, View frequently obscured my dark  effluent/melena and more distally scattered debris and fecal material. Evidence of duodenitis and patchy erythema throughout the small bowel. Mild oozing likely secondary to scope for EGD (this area is likely at the area of peptic stricture). No obvious upper or mid bowel source of melena. Colonoscopy.  Polyps removed. Biopsy showed tubular adenomas  but no high grade dysplasia or carcinoma.  Her HGB on arrival was 5.2 and improved to 8.2 with 2 units  PRBC. Her ASA was discontinued and she was started on BID PPI and iron sulfate BID.  Today she reports doing well.  Appetite and energy levels are about 50%.  She is not taking her iron supplements.  Denies any pica.  She is off of her baby aspirin.  No additional melena.  Reports stable chronic fatigue.  Has trouble falling and staying asleep.  Has occasional tingling sensation in hands and feet.  Occasional urinary incontinence.  Overall, feels stable.  Denies any new concerns. Son feels like she is doing pretty good.   PAST MEDICAL HISTORY:   Past Medical History: Past Medical History:  Diagnosis Date  . Anemia   . Arthritis   . Constipation   . Dysphagia 08/16/2016  . Esophageal stenosis   . GERD (gastroesophageal reflux disease)     Surgical History: Past Surgical History:  Procedure Laterality Date  . ABDOMINAL HYSTERECTOMY    . APPENDECTOMY    . BALLOON DILATION N/A 06/11/2020   Procedure: BALLOON DILATION;  Surgeon: Rogene Houston, MD;  [facility];  Service: Endoscopy;  Laterality: N/A;  . broken arm and wrist with plate and scews rt wrist Right   . CHOLECYSTECTOMY    . COLONOSCOPY    . COLONOSCOPY WITH PROPOFOL N/A 07/23/2022   Procedure: COLONOSCOPY WITH PROPOFOL;  Surgeon: Harvel Quale, MD;  [facility];  Service: Gastroenterology;  Laterality: N/A;  . complete hysterectomy'     fibroid tumors bleeding  . ESOPHAGEAL DILATION N/A 08/22/2016   Procedure: ESOPHAGEAL DILATION;  Surgeon: Rogene Houston, MD;  [facility];  Service: Endoscopy;  Laterality: N/A;  . ESOPHAGEAL DILATION N/A 09/14/2016   Procedure: ESOPHAGEAL DILATION;  Surgeon: Rogene Houston, MD;  [facility];  Service: Endoscopy;  Laterality: N/A;  . ESOPHAGEAL DILATION N/A 07/26/2017   Procedure: ESOPHAGEAL DILATION;  Surgeon: Rogene Houston, MD;   [facility];  Service: Endoscopy;  Laterality: N/A;  . ESOPHAGEAL DILATION N/A 08/31/2017   Procedure: ESOPHAGEAL DILATION;  Surgeon: Rogene Houston, MD;  [facility];  Service: Endoscopy;  Laterality: N/A;  . ESOPHAGEAL DILATION N/A 10/26/2017   Procedure: ESOPHAGEAL DILATION;  Surgeon: Rogene Houston, MD;  [facility];  Service: Endoscopy;  Laterality: N/A;  . ESOPHAGEAL DILATION N/A 02/07/2018   Procedure: ESOPHAGEAL DILATION;  Surgeon: Rogene Houston, MD;  [facility];  Service: Endoscopy;  Laterality: N/A;  . ESOPHAGEAL DILATION N/A 05/31/2019   Procedure: ESOPHAGEAL DILATION;  Surgeon: Rogene Houston, MD;  [facility];  Service: Endoscopy;  Laterality: N/A;  . ESOPHAGEAL DILATION N/A 06/14/2019   Procedure: ESOPHAGEAL DILATION;  Surgeon: Rogene Houston, MD;  [facility];  Service: Endoscopy;  Laterality: N/A;  . ESOPHAGEAL DILATION N/A 07/04/2019   Procedure: ESOPHAGEAL DILATION;  Surgeon: Rogene Houston, MD;  [facility];  Service: Endoscopy;  Laterality: N/A;  730  . ESOPHAGEAL DILATION N/A 08/01/2019   Procedure: ESOPHAGEAL DILATION;  Surgeon: Rogene Houston, MD;  [facility];  Service: Endoscopy;  Laterality: N/A;  . ESOPHAGEAL DILATION N/A 05/14/2020   Procedure: ESOPHAGEAL DILATION;  Surgeon: Rogene Houston, MD;  [facility];  Service: Endoscopy;  Laterality: N/A;  . ESOPHAGOGASTRODUODENOSCOPY N/A 08/22/2016   Procedure: ESOPHAGOGASTRODUODENOSCOPY (EGD);  Surgeon: Rogene Houston, MD;  [facility];  Service: Endoscopy;  Laterality: N/A;  . ESOPHAGOGASTRODUODENOSCOPY N/A 09/14/2016   Procedure: ESOPHAGOGASTRODUODENOSCOPY (EGD);  Surgeon: Rogene Houston, MD;  [facility];  Service: Endoscopy;  Laterality: N/A;  1225  . ESOPHAGOGASTRODUODENOSCOPY N/A 07/26/2017   Procedure: ESOPHAGOGASTRODUODENOSCOPY (EGD);  Surgeon: Rogene Houston, MD;  [facility];  Service: Endoscopy;  Laterality: N/A;  1:45  . ESOPHAGOGASTRODUODENOSCOPY N/A 08/31/2017   Procedure: ESOPHAGOGASTRODUODENOSCOPY (EGD);  Surgeon: Rogene Houston, MD;  [facility];  Service: Endoscopy;  Laterality: N/A;  . ESOPHAGOGASTRODUODENOSCOPY N/A 10/26/2017   Procedure: ESOPHAGOGASTRODUODENOSCOPY (EGD);  Surgeon: Rogene Houston, MD;  [facility];  Service: Endoscopy;  Laterality: N/A;  1115  . ESOPHAGOGASTRODUODENOSCOPY N/A 02/07/2018   Procedure: ESOPHAGOGASTRODUODENOSCOPY (EGD);  Surgeon: Rogene Houston, MD;  [facility];  Service: Endoscopy;  Laterality: N/A;  830  . ESOPHAGOGASTRODUODENOSCOPY N/A 07/04/2019   Procedure: ESOPHAGOGASTRODUODENOSCOPY (EGD);  Surgeon: Rogene Houston, MD;  [facility];  Service: Endoscopy;  Laterality: N/A;  730  . ESOPHAGOGASTRODUODENOSCOPY N/A 08/01/2019   Procedure: ESOPHAGOGASTRODUODENOSCOPY (EGD);  Surgeon: Rogene Houston, MD;  [facility];  Service: Endoscopy;  Laterality: N/A;  125  . ESOPHAGOGASTRODUODENOSCOPY N/A 05/14/2020   Procedure: ESOPHAGOGASTRODUODENOSCOPY (EGD);  Surgeon: Rogene Houston, MD;  [facility];  Service: Endoscopy;  Laterality: N/A;  10:30  . ESOPHAGOGASTRODUODENOSCOPY N/A 06/11/2020   Procedure: ESOPHAGOGASTRODUODENOSCOPY (EGD);  Surgeon: Rogene Houston, MD;  [facility];  Service: Endoscopy;  Laterality: N/A;  7:30  . ESOPHAGOGASTRODUODENOSCOPY (EGD) WITH PROPOFOL N/A 05/31/2019   Procedure: ESOPHAGOGASTRODUODENOSCOPY (EGD) WITH PROPOFOL;  Surgeon: Rogene Houston, MD;  [facility];  Service: Endoscopy;  Laterality: N/A;  2:35  . ESOPHAGOGASTRODUODENOSCOPY (EGD) WITH PROPOFOL N/A 06/14/2019   Procedure: ESOPHAGOGASTRODUODENOSCOPY (EGD) WITH PROPOFOL;  Surgeon: Rogene Houston, MD;  [facility];  Service: Endoscopy;  Laterality: N/A;  . ESOPHAGOGASTRODUODENOSCOPY (EGD) WITH PROPOFOL N/A 07/21/2022    Procedure: ESOPHAGOGASTRODUODENOSCOPY (EGD) WITH PROPOFOL;  Surgeon: Daneil Dolin, MD;  [facility];  Service: Endoscopy;  Laterality: N/A;  . FOREIGN BODY REMOVAL  05/31/2019   Procedure: FOREIGN BODY REMOVAL;  Surgeon: Rogene Houston, MD;  [facility];  Service: Endoscopy;;  . GIVENS CAPSULE STUDY N/A 07/21/2022   Procedure: GIVENS CAPSULE STUDY;  Surgeon: Daneil Dolin, MD;  [facility];  Service: Endoscopy;  Laterality: N/A;  . POLYPECTOMY  07/23/2022   Procedure: POLYPECTOMY;  Surgeon: Harvel Quale, MD;  [facility];  Service: Gastroenterology;;    Social History: Social History   Socioeconomic History  . Marital status: Widowed    Spouse name: Not on file  . Number of children: Not on file  . Years of education: Not on file  . Highest education level: Not on file  Occupational History  . Not on file  Tobacco Use  . Smoking status: Never    Passive exposure: Current  . Smokeless tobacco: Never  Vaping Use  . Vaping Use: Never used  Substance and Sexual Activity  .  Alcohol use: No  . Drug use: No  . Sexual activity: Not on file  Other Topics Concern  . Not on file  Social History Narrative  . Not on file   Social Determinants of Health   Financial Resource Strain: Not on file  Food Insecurity: No Food Insecurity (08/05/2022)   Hunger Vital Sign   . Worried About Charity fundraiser in the Last Year: Never true   . Ran Out of Food in the Last Year: Never true  Transportation Needs: No Transportation Needs (08/05/2022)   PRAPARE - Transportation   . Lack of Transportation (Medical): No   . Lack of Transportation (Non-Medical): No  Physical Activity: Not on file  Stress: Not on file  Social Connections: Not on file  Intimate Partner Violence: Not At Risk (08/05/2022)   Humiliation, Afraid, Rape, and Kick questionnaire   . Fear of Current or Ex-Partner: No   . Emotionally Abused: No   . Physically Abused:  No   . Sexually Abused: No    Family History: Family History  Problem Relation Age of Onset  . Heart Problems Mother   . Alzheimer's disease Mother   . Hypertension Mother   . Prostate cancer Father   . Heart attack Father   . Heart Problems Sister     Current Medications:  Current Outpatient Medications:  .  Ascorbic Acid (VITAMIN C) 1000 MG tablet, Take 1,000 mg by mouth daily., Disp: , Rfl:  .  Cholecalciferol (VITAMIN D) 50 MCG (2000 UT) tablet, Take 2,000 Units by mouth daily., Disp: , Rfl:  .  docusate sodium (COLACE) 100 MG capsule, Take 100 mg by mouth at bedtime., Disp: , Rfl:  .  esomeprazole (NEXIUM) 20 MG capsule, Take 1 capsule (20 mg total) by mouth 2 (two) times daily before a meal. (Patient taking differently: Take 20 mg by mouth daily before breakfast. Takes once daily. Gets OTC), Disp: , Rfl:  .  gabapentin (NEURONTIN) 300 MG capsule, Take 300 mg by mouth. Two capsules tid, Disp: , Rfl:  .  HYDROcodone-acetaminophen (NORCO/VICODIN) 5-325 MG tablet, Take 1 tablet by mouth every 6 (six) hours as needed for moderate pain. (Patient not taking: Reported on 07/20/2022), Disp: , Rfl:  .  Iron, Ferrous Sulfate, 325 (65 Fe) MG TABS, Take 325 mg by mouth in the morning and at bedtime., Disp: , Rfl:  .  Multiple Vitamin (MULTIVITAMIN WITH MINERALS) TABS tablet, Take 1 tablet by mouth daily. Centrum Silver, Disp: , Rfl:  .  valACYclovir (VALTREX) 1000 MG tablet, Take 1,000 mg by mouth 3 (three) times daily. (Patient not taking: Reported on 07/20/2022), Disp: , Rfl:    Allergies: Allergies  Allergen Reactions  . Penicillins Rash and Other (See Comments)    40 years ago, caused rash and tachycardia Has patient had a PCN reaction causing immediate rash, facial/tongue/throat swelling, SOB or lightheadedness with hypotension: No Has patient had a PCN reaction causing severe rash involving mucus membranes or skin necrosis: No Has patient had a PCN reaction that required  hospitalization: No Has patient had a PCN reaction occurring within the last 10 years: No If all of the above answers are "NO", then may proceed with Cephalosporin use.     REVIEW OF SYSTEMS:   Review of Systems  Constitutional:  Positive for fatigue.  Gastrointestinal:  Negative for abdominal pain, blood in stool, constipation and diarrhea.  Genitourinary:  Positive for bladder incontinence.   Neurological:  Positive for numbness.  Psychiatric/Behavioral:  Positive for sleep disturbance.      VITALS:   There were no vitals taken for this visit.  Wt Readings from Last 3 Encounters:  07/21/22 184 lb 15.5 oz (83.9 kg)  07/19/22 184 lb 9.6 oz (83.7 kg)  11/11/21 187 lb 14.4 oz (85.2 kg)    There is no height or weight on file to calculate BMI.   PHYSICAL EXAM:   Physical Exam Constitutional:      Appearance: Normal appearance.  HENT:     Head: Normocephalic and atraumatic.  Eyes:     Pupils: Pupils are equal, round, and reactive to light.  Cardiovascular:     Rate and Rhythm: Normal rate and regular rhythm.     Heart sounds: Normal heart sounds. No murmur heard. Pulmonary:     Effort: Pulmonary effort is normal.     Breath sounds: Normal breath sounds. No wheezing.  Abdominal:     General: Bowel sounds are normal. There is no distension.     Palpations: Abdomen is soft.     Tenderness: There is no abdominal tenderness.  Musculoskeletal:        General: Normal range of motion.     Cervical back: Normal range of motion.  Skin:    General: Skin is warm and dry.     Findings: No rash.  Neurological:     Mental Status: She is alert and oriented to person, place, and time.  Psychiatric:        Judgment: Judgment normal.    LABS:      Latest Ref Rng & Units 08/05/2022    1:28 PM 07/22/2022    8:57 AM 07/21/2022    7:59 AM  CBC  WBC 4.0 - 10.5 K/uL 5.5  6.4  7.4   Hemoglobin 12.0 - 15.0 g/dL 9.6  8.2  8.5   Hematocrit 36.0 - 46.0 % 32.4  27.5  26.8   Platelets  150 - 400 K/uL 335  224  267       Latest Ref Rng & Units 07/21/2022    7:59 AM 07/20/2022   10:20 AM 05/09/2019    3:13 PM  CMP  Glucose 70 - 99 mg/dL 97  112  93   BUN 8 - 23 mg/dL 15  19  12    Creatinine 0.44 - 1.00 mg/dL 1.03  1.17  1.02   Sodium 135 - 145 mmol/L 138  137  141   Potassium 3.5 - 5.1 mmol/L 3.8  4.3  4.3   Chloride 98 - 111 mmol/L 108  108  107   CO2 22 - 32 mmol/L 22  22  23    Calcium 8.9 - 10.3 mg/dL 8.2  8.2  9.2   Total Protein 6.1 - 8.1 g/dL   6.8   Total Bilirubin 0.2 - 1.2 mg/dL   0.5   AST 10 - 35 U/L   19   ALT 6 - 29 U/L   9      No results found for: "CEA1", "CEA" / No results found for: "CEA1", "CEA" No results found for: "PSA1" No results found for: "CAN199" No results found for: "CAN125"  Lab Results  Component Value Date   TOTALPROTELP 6.7 08/05/2022   TOTALPROTELP 6.8 08/05/2022   ALBUMINELP 3.6 08/05/2022   A1GS 0.3 08/05/2022   A2GS 0.5 08/05/2022   BETS 1.3 08/05/2022   GAMS 1.1 08/05/2022   MSPIKE Not Observed 08/05/2022   SPEI Comment 08/05/2022   Lab Results  Component  Value Date   TIBC 459 (H) 08/05/2022   TIBC 364 05/09/2019   FERRITIN 20 08/05/2022   FERRITIN 9 (L) 05/09/2019   IRONPCTSAT 23 08/05/2022   IRONPCTSAT 4 (L) 05/09/2019   Lab Results  Component Value Date   LDH 218 (H) 08/05/2022    Pathology 07/23/22: FINAL MICROSCOPIC DIAGNOSIS:   A. ASCENDING AND TRANSVERSE COLON, POLYPECTOMY:  Tubular adenoma, 8 fragments  Negative for high-grade dysplasia and carcinoma   B. DESCENDING COLON, POLYPECTOMY:  Tubular adenoma  Negative for high-grade dysplasia and carcinoma   STUDIES:   No results found.

## 2022-08-18 NOTE — Patient Instructions (Signed)
[facility]  Discharge Instructions: Thank you for choosing [facility] to provide your oncology and hematology care.  If you have a lab appointment with the Cancer Center, please come in thru the Main Entrance and check in at the main information desk.  Wear comfortable clothing and clothing appropriate for easy access to any Portacath or PICC line.   We strive to give you quality time with your provider. You may need to reschedule your appointment if you arrive late (15 or more minutes).  Arriving late affects you and other patients whose appointments are after yours.  Also, if you miss three or more appointments without notifying the office, you may be dismissed from the clinic at the provider's discretion.      For prescription refill requests, have your pharmacy contact our office and allow 72 hours for refills to be completed.    Today you received the following chemotherapy and/or immunotherapy agents Feraheme      To help prevent nausea and vomiting after your treatment, we encourage you to take your nausea medication as directed.  BELOW ARE SYMPTOMS THAT SHOULD BE REPORTED IMMEDIATELY: *FEVER GREATER THAN 100.4 F (38 C) OR HIGHER *CHILLS OR SWEATING *NAUSEA AND VOMITING THAT IS NOT CONTROLLED WITH YOUR NAUSEA MEDICATION *UNUSUAL SHORTNESS OF BREATH *UNUSUAL BRUISING OR BLEEDING *URINARY PROBLEMS (pain or burning when urinating, or frequent urination) *BOWEL PROBLEMS (unusual diarrhea, constipation, pain near the anus) TENDERNESS IN MOUTH AND THROAT WITH OR WITHOUT PRESENCE OF ULCERS (sore throat, sores in mouth, or a toothache) UNUSUAL RASH, SWELLING OR PAIN  UNUSUAL VAGINAL DISCHARGE OR ITCHING   Items with * indicate a potential emergency and should be followed up as soon as possible or go to the Emergency Department if any problems should occur.  Please show the CHEMOTHERAPY ALERT CARD or IMMUNOTHERAPY ALERT CARD at check-in to the  Emergency Department and triage nurse.  Should you have questions after your visit or need to cancel or reschedule your appointment, please contact [facility] [phone]  and follow the prompts.  Office hours are 8:00 a.m. to 4:30 p.m. Monday - Friday. Please note that voicemails left after 4:00 p.m. may not be returned until the following business day.  We are closed weekends and major holidays. You have access to a nurse at all times for urgent questions. Please call the main number to the clinic [phone] and follow the prompts.  For any non-urgent questions, you may also contact your provider using MyChart. We now offer e-Visits for anyone 18 and older to request care online for non-urgent symptoms. For details visit [email or web address].   Also download the MyChart app! Go to the app store, search "MyChart", open the app, select [facility], and log in with your MyChart username and password.   

## 2022-08-18 NOTE — Progress Notes (Signed)
Patient presents today for Venofer infusion per providers order.  Vital signs WNL.  Patient has no new complaints at this time.  Peripheral IV started and blood return noted pre and post infusion.  Treatment given today per MD orders.  Stable during infusion without adverse affects.  Vital signs stable.  No complaints at this time.  Discharge from clinic ambulatory in stable condition.  Alert and oriented X 3.  Follow up with [facility] as scheduled.  

## 2022-08-25 ENCOUNTER — Inpatient Hospital Stay: Payer: Medicare Other | Attending: Hematology

## 2022-08-25 VITALS — BP 154/52 | HR 62 | Temp 96.4°F | Resp 18

## 2022-08-25 DIAGNOSIS — R7402 Elevation of levels of lactic acid dehydrogenase (LDH): Secondary | ICD-10-CM | POA: Insufficient documentation

## 2022-08-25 DIAGNOSIS — Z79899 Other long term (current) drug therapy: Secondary | ICD-10-CM | POA: Insufficient documentation

## 2022-08-25 DIAGNOSIS — D508 Other iron deficiency anemias: Secondary | ICD-10-CM

## 2022-08-25 DIAGNOSIS — K219 Gastro-esophageal reflux disease without esophagitis: Secondary | ICD-10-CM | POA: Insufficient documentation

## 2022-08-25 DIAGNOSIS — D649 Anemia, unspecified: Secondary | ICD-10-CM | POA: Insufficient documentation

## 2022-08-25 DIAGNOSIS — N1831 Chronic kidney disease, stage 3a: Secondary | ICD-10-CM | POA: Diagnosis not present

## 2022-08-25 DIAGNOSIS — D500 Iron deficiency anemia secondary to blood loss (chronic): Secondary | ICD-10-CM | POA: Diagnosis present

## 2022-08-25 DIAGNOSIS — Z86711 Personal history of pulmonary embolism: Secondary | ICD-10-CM | POA: Diagnosis not present

## 2022-08-25 MED ORDER — ACETAMINOPHEN 325 MG PO TABS
650.0000 mg | ORAL_TABLET | Freq: Once | ORAL | Status: AC
  Administered 2022-08-25: 650 mg via ORAL
  Filled 2022-08-25: qty 2

## 2022-08-25 MED ORDER — SODIUM CHLORIDE 0.9 % IV SOLN: Freq: Once | INTRAVENOUS | Status: AC

## 2022-08-25 MED ORDER — CETIRIZINE HCL 10 MG PO TABS
10.0000 mg | ORAL_TABLET | Freq: Once | ORAL | Status: AC
  Administered 2022-08-25: 10 mg via ORAL
  Filled 2022-08-25: qty 1

## 2022-08-25 MED ORDER — SODIUM CHLORIDE 0.9 % IV SOLN
510.0000 mg | Freq: Once | INTRAVENOUS | Status: AC
  Administered 2022-08-25: 510 mg via INTRAVENOUS
  Filled 2022-08-25: qty 17

## 2022-08-25 NOTE — Patient Instructions (Signed)
[facility]  Discharge Instructions: Thank you for choosing [facility] to provide your oncology and hematology care.  If you have a lab appointment with the [facility] - please note that after April 8th, 2024, all labs will be drawn in the cancer center.  You do not have to check in or register with the main entrance as you have in the past but will complete your check-in in the cancer center.  Wear comfortable clothing and clothing appropriate for easy access to any Portacath or PICC line.   We strive to give you quality time with your provider. You may need to reschedule your appointment if you arrive late (15 or more minutes).  Arriving late affects you and other patients whose appointments are after yours.  Also, if you miss three or more appointments without notifying the office, you may be dismissed from the clinic at the provider's discretion.      For prescription refill requests, have your pharmacy contact our office and allow 72 hours for refills to be completed.    Today you received Feraheme IV iron infusion.   .  BELOW ARE SYMPTOMS THAT SHOULD BE REPORTED IMMEDIATELY: *FEVER GREATER THAN 100.4 F (38 C) OR HIGHER *CHILLS OR SWEATING *NAUSEA AND VOMITING THAT IS NOT CONTROLLED WITH YOUR NAUSEA MEDICATION *UNUSUAL SHORTNESS OF BREATH *UNUSUAL BRUISING OR BLEEDING *URINARY PROBLEMS (pain or burning when urinating, or frequent urination) *BOWEL PROBLEMS (unusual diarrhea, constipation, pain near the anus) TENDERNESS IN MOUTH AND THROAT WITH OR WITHOUT PRESENCE OF ULCERS (sore throat, sores in mouth, or a toothache) UNUSUAL RASH, SWELLING OR PAIN  UNUSUAL VAGINAL DISCHARGE OR ITCHING   Items with * indicate a potential emergency and should be followed up as soon as possible or go to the Emergency Department if any problems should occur.  Please show the CHEMOTHERAPY ALERT CARD or IMMUNOTHERAPY ALERT CARD at check-in to the Emergency  Department and triage nurse.  Should you have questions after your visit or need to cancel or reschedule your appointment, please contact [facility] [phone]  and follow the prompts.  Office hours are 8:00 a.m. to 4:30 p.m. Monday - Friday. Please note that voicemails left after 4:00 p.m. may not be returned until the following business day.  We are closed weekends and major holidays. You have access to a nurse at all times for urgent questions. Please call the main number to the clinic [phone] and follow the prompts.  For any non-urgent questions, you may also contact your provider using MyChart. We now offer e-Visits for anyone 80 and older to request care online for non-urgent symptoms. For details visit [email or web address].   Also download the MyChart app! Go to the app store, search "MyChart", open the app, select [facility], and log in with your MyChart username and password.

## 2022-08-25 NOTE — Progress Notes (Signed)
Patient presents today for iron infusion.  Patient is in satisfactory condition with no new complaints voiced.  Vital signs are stable.  We will proceed with infusion per provider orders.    Peripheral IV started with good blood return pre and post infusion.  Feraheme  given today per MD orders. Tolerated infusion without adverse affects. Vital signs stable. No complaints at this time. Discharged from clinic via wheelchair in stable condition. Alert and oriented x 3. F/U with [facility] as scheduled.

## 2022-09-09 ENCOUNTER — Inpatient Hospital Stay: Payer: Medicare Other

## 2022-09-09 DIAGNOSIS — D649 Anemia, unspecified: Secondary | ICD-10-CM

## 2022-09-09 DIAGNOSIS — D500 Iron deficiency anemia secondary to blood loss (chronic): Secondary | ICD-10-CM | POA: Diagnosis not present

## 2022-09-09 LAB — CBC WITH DIFFERENTIAL/PLATELET
Abs Immature Granulocytes: 0.05 10*3/uL (ref 0.00–0.07)
Basophils Absolute: 0 10*3/uL (ref 0.0–0.1)
Basophils Relative: 1 %
Eosinophils Absolute: 0.2 10*3/uL (ref 0.0–0.5)
Eosinophils Relative: 4 %
HCT: 34.3 % — ABNORMAL LOW (ref 36.0–46.0)
Hemoglobin: 10.3 g/dL — ABNORMAL LOW (ref 12.0–15.0)
Immature Granulocytes: 1 %
Lymphocytes Relative: 28 %
Lymphs Abs: 1.3 10*3/uL (ref 0.7–4.0)
MCH: 29.7 pg (ref 26.0–34.0)
MCHC: 30 g/dL (ref 30.0–36.0)
MCV: 98.8 fL (ref 80.0–100.0)
Monocytes Absolute: 0.3 10*3/uL (ref 0.1–1.0)
Monocytes Relative: 7 %
Neutro Abs: 2.8 10*3/uL (ref 1.7–7.7)
Neutrophils Relative %: 59 %
Platelets: 204 10*3/uL (ref 150–400)
RBC: 3.47 MIL/uL — ABNORMAL LOW (ref 3.87–5.11)
RDW: 20.2 % — ABNORMAL HIGH (ref 11.5–15.5)
WBC: 4.7 10*3/uL (ref 4.0–10.5)
nRBC: 0 % (ref 0.0–0.2)

## 2022-09-13 NOTE — Progress Notes (Signed)
[facility] [address], [place] [address]   CLINIC:  Medical Oncology/Hematology  PCP:  Doreatha Massed, MD [address] [place] [address] [phone]   REASON FOR VISIT:  Follow-up for severe normocytic anemia  CURRENT THERAPY: Oral iron, IV iron, intermittent PRBC transfusions  INTERVAL HISTORY:   Valerie Griffin 89 y.o. female returns for routine follow-up of her severe normocytic anemia.  She was last seen by NP Durenda Hurt on 08/18/2022.  At today's visit, she reports feeling fair.  No recent hospitalizations, surgeries, or changes in baseline health status.  She felt some improved energy after IV iron (Feraheme on 08/18/2022 and 08/25/2022), but not quite back to normal.  She denies any obvious melena or hematochezia.  She has occasional "normal" headaches and notes some gait instability and poor sleep quality.  She denies any pica, restless legs, chest pain, dyspnea on exertion, lightheadedness, or syncope.  She has 50% energy and 75% appetite. She endorses that she is maintaining a stable weight.   ASSESSMENT & PLAN:  1.  Severe normocytic anemia: - Patient seen at the request of Dr. Irene Limbo (hospitalist) after hospitalization for from 07/20/2022 - 07/23/2022 for acute blood loss anemia secondary to gastrointestinal hemorrhage (presumed upper GI bleed with melena) Critical anemia on 07/20/2022 with Hgb 5.2, s/p 2 units PRBC EGD (07/21/2022) noncritical peptic stricture, large hiatal hernia.  No bleeding lesion in upper GI tract. Small bowel Givens capsule study (07/22/2022): Melena noted, but no obvious upper or mid bowel source of melena. 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 Angelene Giovanni, received Feraheme on 06/04/2020 and 06/15/2020.  Previously received PRBC x 2 at [facility] - Hematology workup (08/05/2022): Low haptoglobin <10, DAT  IgG positive (complement negative).  LDH mildly elevated 218.  (Bilirubin not checked) - consider that these may have been false positives after recent blood transfusions Reticulocytes 1.4% Ferritin 20, iron saturation 23%, TIBC 459.  Elevated B12 1040, normal MMA.  Normal folate and copper. SPEP and immunofixation negative.  Kappa light chains elevated at 40.1, with normal lambda 26.0, normal FLC ratio 1.54. Prior labs showed CKD stage IIIa/b - She has been taking oral iron therapy for many years (currently taking ferrous sulfate twice daily).  Denies any ice pica. - Received IV iron with Feraheme x 2 on 08/18/2022 and 08/25/2022. - Most recent CBC/differential (09/09/2022) shows improved Hgb 10.3/MCV 98.8 - PLAN: Will recheck CBC, ferritin, iron/TIBC and hemolysis labs (reticulocytes, DAT, fractionated bilirubin, LDH) in 1 month followed by phone visit If any persistent evidence of hemolysis, would consider trial of prednisone - Will recheck MGUS panel in 6 months (October 2024) due to elevated kappa light chains.  2.  Pulmonary embolism: - Diagnosis in 07/2020.  Treated with 6 months of Xarelto.  Unclear by history whether it was provoked or unprovoked.  3.  Social/family history: - She lives at home with her son.  Walks with the help of walker.  She is able to wash dishes, and occasionally cooks.  Prior to retirement, she did secretarial work in an office.  Never smoker.  She is accompanied by her son today. - No family history of anemia.  Daughter had anal cancer.  Father had prostate cancer.  Great grandfather had cancer.  Son died of stomach cancer.  PLAN SUMMARY: >> Labs in 4 weeks = CBC/D, CMP, ferritin, iron/TIBC, reticulocytes, DAT/Coombs, fractionated bilirubin, LDH >>  PHONE visit in 1 months (1 week after labs) - Friday afternoon preferred     REVIEW OF SYSTEMS:   Review of Systems  Constitutional:  Positive for fatigue. Negative for appetite change, chills, diaphoresis, fever and  unexpected weight change.  HENT:   Negative for lump/mass and nosebleeds.   Eyes:  Negative for eye problems.  Respiratory:  Positive for cough (allergies). Negative for hemoptysis and shortness of breath.   Cardiovascular:  Negative for chest pain, leg swelling and palpitations.  Gastrointestinal:  Negative for abdominal pain, blood in stool, constipation, diarrhea, nausea and vomiting.  Genitourinary:  Negative for hematuria.   Musculoskeletal:  Positive for gait problem.  Skin: Negative.   Neurological:  Positive for dizziness, gait problem, headaches and numbness. Negative for light-headedness.  Hematological:  Does not bruise/bleed easily.  Psychiatric/Behavioral:  Positive for sleep disturbance.      PHYSICAL EXAM:  ECOG PERFORMANCE STATUS: 2 - Symptomatic, <50% confined to bed  There were no vitals filed for this visit. There were no vitals filed for this visit. Physical Exam Constitutional:      Appearance: Normal appearance. She is obese.  Cardiovascular:     Heart sounds: Normal heart sounds.  Pulmonary:     Breath sounds: Normal breath sounds.  Neurological:     General: No focal deficit present.     Mental Status: Mental status is at baseline.  Psychiatric:        Behavior: Behavior normal. Behavior is cooperative.     PAST MEDICAL/SURGICAL HISTORY:  Past Medical History:  Diagnosis Date   Anemia    Arthritis    Constipation    Dysphagia 08/16/2016   Esophageal stenosis    GERD (gastroesophageal reflux disease)    Past Surgical History:  Procedure Laterality Date   ABDOMINAL HYSTERECTOMY     APPENDECTOMY     BALLOON DILATION N/A 06/11/2020   Procedure: BALLOON DILATION;  Surgeon: Malissa Hippo, MD;  [facility];  Service: Endoscopy;  Laterality: N/A;   broken arm and wrist with plate and scews rt wrist Right    CHOLECYSTECTOMY     COLONOSCOPY     COLONOSCOPY WITH PROPOFOL N/A 07/23/2022   Procedure: COLONOSCOPY WITH PROPOFOL;  Surgeon:  Dolores Frame, MD;  [facility];  Service: Gastroenterology;  Laterality: N/A;   complete hysterectomy'     fibroid tumors bleeding   ESOPHAGEAL DILATION N/A 08/22/2016   Procedure: ESOPHAGEAL DILATION;  Surgeon: Malissa Hippo, MD;  [facility];  Service: Endoscopy;  Laterality: N/A;   ESOPHAGEAL DILATION N/A 09/14/2016   Procedure: ESOPHAGEAL DILATION;  Surgeon: Malissa Hippo, MD;  [facility];  Service: Endoscopy;  Laterality: N/A;   ESOPHAGEAL DILATION N/A 07/26/2017   Procedure: ESOPHAGEAL DILATION;  Surgeon: Malissa Hippo, MD;  [facility];  Service: Endoscopy;  Laterality: N/A;   ESOPHAGEAL DILATION N/A 08/31/2017   Procedure: ESOPHAGEAL DILATION;  Surgeon: Malissa Hippo, MD;  [facility];  Service: Endoscopy;  Laterality: N/A;   ESOPHAGEAL DILATION N/A 10/26/2017   Procedure: ESOPHAGEAL DILATION;  Surgeon: Malissa Hippo, MD;  [facility];  Service: Endoscopy;  Laterality: N/A;   ESOPHAGEAL DILATION N/A 02/07/2018   Procedure: ESOPHAGEAL DILATION;  Surgeon: Malissa Hippo, MD;  [facility];  Service: Endoscopy;  Laterality: N/A;   ESOPHAGEAL DILATION N/A 05/31/2019   Procedure: ESOPHAGEAL DILATION;  Surgeon: Malissa Hippo, MD;  [facility];  Service: Endoscopy;  Laterality: N/A;   ESOPHAGEAL DILATION N/A 06/14/2019   Procedure: ESOPHAGEAL DILATION;  Surgeon: Malissa Hippo, MD;  [facility];  Service: Endoscopy;  Laterality: N/A;   ESOPHAGEAL DILATION N/A 07/04/2019   Procedure: ESOPHAGEAL DILATION;  Surgeon: Malissa Hippo, MD;  [facility];  Service: Endoscopy;  Laterality: N/A;  730   ESOPHAGEAL DILATION N/A 08/01/2019   Procedure: ESOPHAGEAL DILATION;  Surgeon: Malissa Hippo, MD;  [facility];  Service: Endoscopy;  Laterality: N/A;   ESOPHAGEAL DILATION N/A 05/14/2020   Procedure: ESOPHAGEAL DILATION;  Surgeon: Malissa Hippo,  MD;  [facility];  Service: Endoscopy;  Laterality: N/A;   ESOPHAGOGASTRODUODENOSCOPY N/A 08/22/2016   Procedure: ESOPHAGOGASTRODUODENOSCOPY (EGD);  Surgeon: Malissa Hippo, MD;  [facility];  Service: Endoscopy;  Laterality: N/A;   ESOPHAGOGASTRODUODENOSCOPY N/A 09/14/2016   Procedure: ESOPHAGOGASTRODUODENOSCOPY (EGD);  Surgeon: Malissa Hippo, MD;  [facility];  Service: Endoscopy;  Laterality: N/A;  1225   ESOPHAGOGASTRODUODENOSCOPY N/A 07/26/2017   Procedure: ESOPHAGOGASTRODUODENOSCOPY (EGD);  Surgeon: Malissa Hippo, MD;  [facility];  Service: Endoscopy;  Laterality: N/A;  1:45   ESOPHAGOGASTRODUODENOSCOPY N/A 08/31/2017   Procedure: ESOPHAGOGASTRODUODENOSCOPY (EGD);  Surgeon: Malissa Hippo, MD;  [facility];  Service: Endoscopy;  Laterality: N/A;   ESOPHAGOGASTRODUODENOSCOPY N/A 10/26/2017   Procedure: ESOPHAGOGASTRODUODENOSCOPY (EGD);  Surgeon: Malissa Hippo, MD;  [facility];  Service: Endoscopy;  Laterality: N/A;  1115   ESOPHAGOGASTRODUODENOSCOPY N/A 02/07/2018   Procedure: ESOPHAGOGASTRODUODENOSCOPY (EGD);  Surgeon: Malissa Hippo, MD;  [facility];  Service: Endoscopy;  Laterality: N/A;  830   ESOPHAGOGASTRODUODENOSCOPY N/A 07/04/2019   Procedure: ESOPHAGOGASTRODUODENOSCOPY (EGD);  Surgeon: Malissa Hippo, MD;  [facility];  Service: Endoscopy;  Laterality: N/A;  730   ESOPHAGOGASTRODUODENOSCOPY N/A 08/01/2019   Procedure: ESOPHAGOGASTRODUODENOSCOPY (EGD);  Surgeon: Malissa Hippo, MD;  [facility];  Service: Endoscopy;  Laterality: N/A;  125   ESOPHAGOGASTRODUODENOSCOPY N/A 05/14/2020   Procedure: ESOPHAGOGASTRODUODENOSCOPY (EGD);  Surgeon: Malissa Hippo, MD;  [facility];  Service: Endoscopy;  Laterality: N/A;  10:30   ESOPHAGOGASTRODUODENOSCOPY N/A 06/11/2020   Procedure: ESOPHAGOGASTRODUODENOSCOPY (EGD);  Surgeon: Malissa Hippo, MD;  [facility];  Service: Endoscopy;  Laterality: N/A;  7:30   ESOPHAGOGASTRODUODENOSCOPY (EGD) WITH PROPOFOL N/A 05/31/2019   Procedure: ESOPHAGOGASTRODUODENOSCOPY (EGD) WITH PROPOFOL;  Surgeon: Malissa Hippo, MD;  [facility];  Service: Endoscopy;  Laterality: N/A;  2:35   ESOPHAGOGASTRODUODENOSCOPY (EGD) WITH PROPOFOL N/A 06/14/2019   Procedure: ESOPHAGOGASTRODUODENOSCOPY (EGD) WITH PROPOFOL;  Surgeon: Malissa Hippo, MD;  [facility];  Service: Endoscopy;  Laterality: N/A;   ESOPHAGOGASTRODUODENOSCOPY (EGD) WITH PROPOFOL N/A 07/21/2022   Procedure: ESOPHAGOGASTRODUODENOSCOPY (EGD) WITH PROPOFOL;  Surgeon: Corbin Ade, MD;  [facility];  Service: Endoscopy;  Laterality: N/A;   FOREIGN BODY REMOVAL  05/31/2019   Procedure: FOREIGN BODY REMOVAL;  Surgeon: Malissa Hippo, MD;  [facility];  Service: Endoscopy;;   GIVENS CAPSULE STUDY N/A 07/21/2022   Procedure: GIVENS CAPSULE STUDY;  Surgeon: Corbin Ade, MD;  [facility];  Service: Endoscopy;  Laterality: N/A;   POLYPECTOMY  07/23/2022   Procedure: POLYPECTOMY;  Surgeon: Dolores Frame, MD;  [facility];  Service: Gastroenterology;;    SOCIAL HISTORY:  Social History   Socioeconomic History  Marital status: Widowed    Spouse name: Not on file   Number of children: Not on file   Years of education: Not on file   Highest education level: Not on file  Occupational History   Not on file  Tobacco Use   Smoking status: Never    Passive exposure: Current   Smokeless tobacco: Never  Vaping Use   Vaping Use: Never used  Substance and Sexual Activity   Alcohol use: No   Drug use: No   Sexual activity: Not on file  Other Topics Concern   Not on file  Social History Narrative   Not on file   Social Determinants of Health   Financial Resource Strain: Not on file  Food Insecurity: No Food Insecurity (08/05/2022)   Hunger Vital Sign    Worried About  Running Out of Food in the Last Year: Never true    Ran Out of Food in the Last Year: Never true  Transportation Needs: No Transportation Needs (08/05/2022)   PRAPARE - Administrator, Civil Service (Medical): No    Lack of Transportation (Non-Medical): No  Physical Activity: Not on file  Stress: Not on file  Social Connections: Not on file  Intimate Partner Violence: Not At Risk (08/05/2022)   Humiliation, Afraid, Rape, and Kick questionnaire    Fear of Current or Ex-Partner: No    Emotionally Abused: No    Physically Abused: No    Sexually Abused: No    FAMILY HISTORY:  Family History  Problem Relation Age of Onset   Heart Problems Mother    Alzheimer's disease Mother    Hypertension Mother    Prostate cancer Father    Heart attack Father    Heart Problems Sister     CURRENT MEDICATIONS:  Outpatient Encounter Medications as of 09/14/2022  Medication Sig Note   Ascorbic Acid (VITAMIN C) 1000 MG tablet Take 1,000 mg by mouth daily.    Cholecalciferol (VITAMIN D) 50 MCG (2000 UT) tablet Take 2,000 Units by mouth daily.    docusate sodium (COLACE) 100 MG capsule Take 100 mg by mouth at bedtime.    esomeprazole (NEXIUM) 20 MG capsule Take 1 capsule (20 mg total) by mouth 2 (two) times daily before a meal. (Patient taking differently: Take 20 mg by mouth daily before breakfast. Takes once daily. Gets OTC)    gabapentin (NEURONTIN) 300 MG capsule Take 300 mg by mouth. Two capsules tid 07/19/2022: 2 at qhs and 2 during the day as needed   HYDROcodone-acetaminophen (NORCO/VICODIN) 5-325 MG tablet Take 1 tablet by mouth every 6 (six) hours as needed for moderate pain.    Iron, Ferrous Sulfate, 325 (65 Fe) MG TABS Take 325 mg by mouth in the morning and at bedtime.    Multiple Vitamin (MULTIVITAMIN WITH MINERALS) TABS tablet Take 1 tablet by mouth daily. Centrum Silver    valACYclovir (VALTREX) 1000 MG tablet Take 1,000 mg by mouth 3 (three) times daily.    No  facility-administered encounter medications on file as of 09/14/2022.    ALLERGIES:  Allergies  Allergen Reactions   Penicillins Rash and Other (See Comments)    40 years ago, caused rash and tachycardia Has patient had a PCN reaction causing immediate rash, facial/tongue/throat swelling, SOB or lightheadedness with hypotension: No Has patient had a PCN reaction causing severe rash involving mucus membranes or skin necrosis: No Has patient had a PCN reaction that required hospitalization: No Has patient had a PCN reaction  occurring within the last 10 years: No If all of the above answers are "NO", then may proceed with Cephalosporin use.     LABORATORY DATA:  I have reviewed the labs as listed.  CBC    Component Value Date/Time   WBC 4.7 09/09/2022 1358   RBC 3.47 (L) 09/09/2022 1358   HGB 10.3 (L) 09/09/2022 1358   HCT 34.3 (L) 09/09/2022 1358   PLT 204 09/09/2022 1358   MCV 98.8 09/09/2022 1358   MCH 29.7 09/09/2022 1358   MCHC 30.0 09/09/2022 1358   RDW 20.2 (H) 09/09/2022 1358   LYMPHSABS 1.3 09/09/2022 1358   MONOABS 0.3 09/09/2022 1358   EOSABS 0.2 09/09/2022 1358   BASOSABS 0.0 09/09/2022 1358      Latest Ref Rng & Units 07/21/2022    7:59 AM 07/20/2022   10:20 AM 05/09/2019    3:13 PM  CMP  Glucose 70 - 99 mg/dL 97  784  93   BUN 8 - 23 mg/dL 15  19  12    Creatinine 0.44 - 1.00 mg/dL 6.96  2.95  2.84   Sodium 135 - 145 mmol/L 138  137  141   Potassium 3.5 - 5.1 mmol/L 3.8  4.3  4.3   Chloride 98 - 111 mmol/L 108  108  107   CO2 22 - 32 mmol/L 22  22  23    Calcium 8.9 - 10.3 mg/dL 8.2  8.2  9.2   Total Protein 6.1 - 8.1 g/dL   6.8   Total Bilirubin 0.2 - 1.2 mg/dL   0.5   AST 10 - 35 U/L   19   ALT 6 - 29 U/L   9     DIAGNOSTIC IMAGING:  I have independently reviewed the relevant imaging and discussed with the patient.   WRAP UP:  All questions were answered. The patient knows to call the clinic with any problems, questions or concerns.  Medical  decision making: Moderate  Time spent on visit: I spent 20 minutes counseling the patient face to face. The total time spent in the appointment was 30 minutes and more than 50% was on counseling.  Carnella Guadalajara, PA-C  09/14/22 10:09 AM

## 2022-09-14 ENCOUNTER — Inpatient Hospital Stay: Payer: Medicare Other | Admitting: Physician Assistant

## 2022-09-14 ENCOUNTER — Other Ambulatory Visit: Payer: Self-pay

## 2022-09-14 VITALS — BP 123/65 | HR 77 | Temp 98.3°F | Resp 16

## 2022-09-14 DIAGNOSIS — D508 Other iron deficiency anemias: Secondary | ICD-10-CM

## 2022-09-14 DIAGNOSIS — D649 Anemia, unspecified: Secondary | ICD-10-CM

## 2022-09-14 DIAGNOSIS — D500 Iron deficiency anemia secondary to blood loss (chronic): Secondary | ICD-10-CM

## 2022-09-14 NOTE — Patient Instructions (Signed)
[facility] at [facility] **VISIT SUMMARY & IMPORTANT INSTRUCTIONS **   You were seen today by Rojelio Brenner PA-C for your anemia.   Your labs showed that your anemia is from iron deficiency.  This is most likely related to blood loss from your stomach and intestines. Your blood counts have improved after you are treated with IV iron. Your labs also showed some possible evidence of "hemolysis" (blood cells breaking apart), but these test results can be difficult to interpret when you had recently had a blood transfusion. We will check your labs again in 1 month to see if you need more IV iron or have any further evidence of hemolysis.  MEDICATIONS: Continue taking iron tablet daily.  FOLLOW-UP APPOINTMENT: Labs in 4 weeks with PHONE visit 1 week after  ** Thank you for trusting me with your healthcare!  I strive to provide all of my patients with quality care at each visit.  If you receive a survey for this visit, I would be so grateful to you for taking the time to provide feedback.  Thank you in advance!  ~ Luca Burston                   Dr. Doreatha Massed   &   Rojelio Brenner, PA-C   - - - - - - - - - - - - - - - - - -    Thank you for choosing [facility] at [facility] to provide your oncology and hematology care.  To afford each patient quality time with our provider, please arrive at least 15 minutes before your scheduled appointment time.   If you have a lab appointment with the Cancer Center please come in thru the Main Entrance and check in at the main information desk.  You need to re-schedule your appointment should you arrive 10 or more minutes late.  We strive to give you quality time with our providers, and arriving late affects you and other patients whose appointments are after yours.  Also, if you no show three or more times for appointments you may be dismissed from the clinic at the providers discretion.     Again,  thank you for choosing [facility].  Our hope is that these requests will decrease the amount of time that you wait before being seen by our physicians.       _____________________________________________________________  Should you have questions after your visit to [facility], please contact our office at [phone] and follow the prompts.  Our office hours are 8:00 a.m. and 4:30 p.m. Monday - Friday.  Please note that voicemails left after 4:00 p.m. may not be returned until the following business day.  We are closed weekends and major holidays.  You do have access to a nurse 24-7, just call the main number to the clinic [phone] and do not press any options, hold on the line and a nurse will answer the phone.    For prescription refill requests, have your pharmacy contact our office and allow 72 hours.

## 2022-09-20 ENCOUNTER — Encounter (INDEPENDENT_AMBULATORY_CARE_PROVIDER_SITE_OTHER): Payer: Self-pay | Admitting: Gastroenterology

## 2022-09-20 ENCOUNTER — Ambulatory Visit (INDEPENDENT_AMBULATORY_CARE_PROVIDER_SITE_OTHER): Payer: Medicare Other | Admitting: Gastroenterology

## 2022-09-20 VITALS — BP 121/63 | HR 65 | Temp 98.1°F | Ht 65.0 in | Wt 179.7 lb

## 2022-09-20 DIAGNOSIS — D508 Other iron deficiency anemias: Secondary | ICD-10-CM

## 2022-09-20 NOTE — Patient Instructions (Addendum)
Continue nexium once daily and Iron twice daily  Please continue to follow up with hematology for routine blood work/iron infusions as recommended by them If you have any further rectal bleeding or black stools please make [place] aware As hematology is managing your iron deficiency anemia at this time, we will plan to see you back in about 1 year unless you have new or worsening GI symptoms

## 2022-09-20 NOTE — Progress Notes (Addendum)
Referring Provider: Ignatius Specking, MD Primary Care Physician:  Valerie Massed, MD Primary GI Physician: Valerie Griffin   Chief Complaint  Patient presents with   post procedure follow up    Post procedure follow up. Had tcs and egd.  States she has not seen any dark stools. Takes iron once daily and takes nexium in the mornings. Reports no GI symptoms.    HPI:   Valerie Griffin is a 89 y.o. female with past medical history of  anemia, arthritis, constipation, dysphagia, esophageal stenosis, GERD   Patient presenting today for hospital follow up of anemia.  Patient seen at the end of February, noting melena, SOB with exertion and more GERD symptoms, h&h was checked and patient found to be severely anemic at 5.9, advised to proceed to the ER where hgb was 5.2. she was transfused 2 units PRBCs. she underwent EGD, Colonoscopy and givens capsule study, as outlined below, no obvious sources of bleeding found, recommended to have Hematology outpatient evaluation, continue Ferrous sulfate 325 mg twice a day, and PPI.   Last hgb on 3/15 was 9.6, ferritin 20, iron 107, TIBC 459, saturation 23. patient saw hematology, last on 4/24 with one month follow up planned. she has received IV iron with feraheme x2 on 3/28 and 4/4.  Present:  She states she is doing okay, she denies dizziness, she has some SOB on exertion and some fatigue. She is still taking PO iron. She denies rectal bleeding or melena. No abdominal pain. Appetite is pretty good, she notes she is eating better than she was previously. She feels that GERD is well controlled right now on nexium 20mg  once daily. She denies any nausea or vomiting. Weight is down about 5 pounds since her last visit. She has some issues with constipation, but usually having a BM once daily. She is taking colace daily. She notes it is difficult to come to [place] for so many doctors appts given her age and health status, she lives in [place] and has quite a drive  here.    Last Colonoscopy:07/23/22 - Capsule endoscopy in the cecum.                           - Three 4 to 10 mm polyps in the transverse colon                            and in the ascending colon, removed with a cold                            snare. Resected and retrieved.                           - One 1 mm polyp in the ascending colon, removed                            with a cold biopsy forceps. Resected and retrieved.                           - Two 4 to 6 mm polyps in the descending colon,  removed with a cold snare. Resected and retrieved.                           - Diverticulosis in the sigmoid colon.                           - The distal rectum and anal verge are normal on                            retroflexion view. 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 No dilation required.                           -Large hiatal hernia. Otherwise, normal stomach.                           - Normal duodenal bulb and second portion of the                            duodenum. No bleeding lesion found in the upper GI                            tract.                           -Given capsule deployed into the duodenum.  Recommendations:    Past Medical History:  Diagnosis Date   Anemia    Arthritis    Constipation    Dysphagia 08/16/2016   Esophageal stenosis    GERD (gastroesophageal reflux disease)     Past Surgical History:  Procedure Laterality Date   ABDOMINAL HYSTERECTOMY     APPENDECTOMY     BALLOON DILATION N/A 06/11/2020   Procedure: BALLOON DILATION;  Surgeon: Malissa Hippo, MD;  [facility];  Service: Endoscopy;  Laterality: N/A;   broken arm and wrist with plate and scews rt wrist Right    CHOLECYSTECTOMY     COLONOSCOPY      COLONOSCOPY WITH PROPOFOL N/A 07/23/2022   Procedure: COLONOSCOPY WITH PROPOFOL;  Surgeon: Dolores Frame, MD;  [facility];  Service: Gastroenterology;  Laterality: N/A;   complete hysterectomy'     fibroid tumors bleeding   ESOPHAGEAL DILATION N/A 08/22/2016   Procedure: ESOPHAGEAL DILATION;  Surgeon: Malissa Hippo, MD;  [facility];  Service: Endoscopy;  Laterality: N/A;   ESOPHAGEAL DILATION N/A 09/14/2016   Procedure: ESOPHAGEAL DILATION;  Surgeon: Malissa Hippo, MD;  [facility];  Service: Endoscopy;  Laterality: N/A;   ESOPHAGEAL DILATION N/A 07/26/2017   Procedure: ESOPHAGEAL DILATION;  Surgeon: Malissa Hippo, MD;  [facility];  Service: Endoscopy;  Laterality: N/A;   ESOPHAGEAL DILATION N/A 08/31/2017   Procedure: ESOPHAGEAL DILATION;  Surgeon: Lionel December  U, MD;  [facility];  Service: Endoscopy;  Laterality: N/A;   ESOPHAGEAL DILATION N/A 10/26/2017   Procedure: ESOPHAGEAL DILATION;  Surgeon: Malissa Hippo, MD;  [facility];  Service: Endoscopy;  Laterality: N/A;   ESOPHAGEAL DILATION N/A 02/07/2018   Procedure: ESOPHAGEAL DILATION;  Surgeon: Malissa Hippo, MD;  [facility];  Service: Endoscopy;  Laterality: N/A;   ESOPHAGEAL DILATION N/A 05/31/2019   Procedure: ESOPHAGEAL DILATION;  Surgeon: Malissa Hippo, MD;  [facility];  Service: Endoscopy;  Laterality: N/A;   ESOPHAGEAL DILATION N/A 06/14/2019   Procedure: ESOPHAGEAL DILATION;  Surgeon: Malissa Hippo, MD;  [facility];  Service: Endoscopy;  Laterality: N/A;   ESOPHAGEAL DILATION N/A 07/04/2019   Procedure: ESOPHAGEAL DILATION;  Surgeon: Malissa Hippo, MD;  [facility];  Service: Endoscopy;  Laterality: N/A;  730   ESOPHAGEAL DILATION N/A 08/01/2019   Procedure: ESOPHAGEAL DILATION;  Surgeon: Malissa Hippo, MD;  [facility];  Service: Endoscopy;  Laterality: N/A;    ESOPHAGEAL DILATION N/A 05/14/2020   Procedure: ESOPHAGEAL DILATION;  Surgeon: Malissa Hippo, MD;  [facility];  Service: Endoscopy;  Laterality: N/A;   ESOPHAGOGASTRODUODENOSCOPY N/A 08/22/2016   Procedure: ESOPHAGOGASTRODUODENOSCOPY (EGD);  Surgeon: Malissa Hippo, MD;  [facility];  Service: Endoscopy;  Laterality: N/A;   ESOPHAGOGASTRODUODENOSCOPY N/A 09/14/2016   Procedure: ESOPHAGOGASTRODUODENOSCOPY (EGD);  Surgeon: Malissa Hippo, MD;  [facility];  Service: Endoscopy;  Laterality: N/A;  1225   ESOPHAGOGASTRODUODENOSCOPY N/A 07/26/2017   Procedure: ESOPHAGOGASTRODUODENOSCOPY (EGD);  Surgeon: Malissa Hippo, MD;  [facility];  Service: Endoscopy;  Laterality: N/A;  1:45   ESOPHAGOGASTRODUODENOSCOPY N/A 08/31/2017   Procedure: ESOPHAGOGASTRODUODENOSCOPY (EGD);  Surgeon: Malissa Hippo, MD;  [facility];  Service: Endoscopy;  Laterality: N/A;   ESOPHAGOGASTRODUODENOSCOPY N/A 10/26/2017   Procedure: ESOPHAGOGASTRODUODENOSCOPY (EGD);  Surgeon: Malissa Hippo, MD;  [facility];  Service: Endoscopy;  Laterality: N/A;  1115   ESOPHAGOGASTRODUODENOSCOPY N/A 02/07/2018   Procedure: ESOPHAGOGASTRODUODENOSCOPY (EGD);  Surgeon: Malissa Hippo, MD;  [facility];  Service: Endoscopy;  Laterality: N/A;  830   ESOPHAGOGASTRODUODENOSCOPY N/A 07/04/2019   Procedure: ESOPHAGOGASTRODUODENOSCOPY (EGD);  Surgeon: Malissa Hippo, MD;  [facility];  Service: Endoscopy;  Laterality: N/A;  730   ESOPHAGOGASTRODUODENOSCOPY N/A 08/01/2019   Procedure: ESOPHAGOGASTRODUODENOSCOPY (EGD);  Surgeon: Malissa Hippo, MD;  [facility];  Service: Endoscopy;  Laterality: N/A;  125   ESOPHAGOGASTRODUODENOSCOPY N/A 05/14/2020   Procedure: ESOPHAGOGASTRODUODENOSCOPY (EGD);  Surgeon: Malissa Hippo, MD;  [facility];  Service: Endoscopy;  Laterality: N/A;  10:30   ESOPHAGOGASTRODUODENOSCOPY N/A 06/11/2020    Procedure: ESOPHAGOGASTRODUODENOSCOPY (EGD);  Surgeon: Malissa Hippo, MD;  [facility];  Service: Endoscopy;  Laterality: N/A;  7:30   ESOPHAGOGASTRODUODENOSCOPY (EGD) WITH PROPOFOL N/A 05/31/2019   Procedure: ESOPHAGOGASTRODUODENOSCOPY (EGD) WITH PROPOFOL;  Surgeon: Malissa Hippo, MD;  [facility];  Service: Endoscopy;  Laterality: N/A;  2:35   ESOPHAGOGASTRODUODENOSCOPY (EGD) WITH PROPOFOL N/A 06/14/2019   Procedure: ESOPHAGOGASTRODUODENOSCOPY (EGD) WITH PROPOFOL;  Surgeon: Malissa Hippo, MD;  [facility];  Service: Endoscopy;  Laterality: N/A;   ESOPHAGOGASTRODUODENOSCOPY (EGD) WITH PROPOFOL N/A 07/21/2022   Procedure: ESOPHAGOGASTRODUODENOSCOPY (EGD) WITH PROPOFOL;  Surgeon: Corbin Ade, MD;  [facility];  Service: Endoscopy;  Laterality: N/A;   FOREIGN  BODY REMOVAL  05/31/2019   Procedure: FOREIGN BODY REMOVAL;  Surgeon: Malissa Hippo, MD;  [facility];  Service: Endoscopy;;   GIVENS CAPSULE STUDY N/A 07/21/2022   Procedure: GIVENS CAPSULE STUDY;  Surgeon: Corbin Ade, MD;  [facility];  Service: Endoscopy;  Laterality: N/A;   POLYPECTOMY  07/23/2022   Procedure: POLYPECTOMY;  Surgeon: Marguerita Merles, Reuel Boom, MD;  [facility];  Service: Gastroenterology;;    Current Outpatient Medications  Medication Sig Dispense Refill   Ascorbic Acid (VITAMIN C) 1000 MG tablet Take 1,000 mg by mouth daily.     Cholecalciferol (VITAMIN D) 50 MCG (2000 UT) tablet Take 2,000 Units by mouth daily.     docusate sodium (COLACE) 100 MG capsule Take 100 mg by mouth at bedtime.     esomeprazole (NEXIUM) 20 MG capsule Take 1 capsule (20 mg total) by mouth 2 (two) times daily before a meal. (Patient taking differently: Take 20 mg by mouth daily before breakfast. Takes once daily. Gets OTC)     gabapentin (NEURONTIN) 300 MG capsule Take 300 mg by mouth. Two capsules tid     HYDROcodone-acetaminophen (NORCO/VICODIN)  5-325 MG tablet Take 1 tablet by mouth every 6 (six) hours as needed for moderate pain.     Iron, Ferrous Sulfate, 325 (65 Fe) MG TABS Take 325 mg by mouth in the morning and at bedtime.     montelukast (SINGULAIR) 10 MG tablet Take 10 mg by mouth at bedtime.     Multiple Vitamin (MULTIVITAMIN WITH MINERALS) TABS tablet Take 1 tablet by mouth daily. Centrum Silver     No current facility-administered medications for this visit.    Allergies as of 09/20/2022 - Review Complete 09/20/2022  Allergen Reaction Noted   Penicillins Rash and Other (See Comments) 08/16/2016    Family History  Problem Relation Age of Onset   Heart Problems Mother    Alzheimer's disease Mother    Hypertension Mother    Prostate cancer Father    Heart attack Father    Heart Problems Sister     Social History   Socioeconomic History   Marital status: Widowed    Spouse name: Not on file   Number of children: Not on file   Years of education: Not on file   Highest education level: Not on file  Occupational History   Not on file  Tobacco Use   Smoking status: Never    Passive exposure: Current   Smokeless tobacco: Never  Vaping Use   Vaping Use: Never used  Substance and Sexual Activity   Alcohol use: No   Drug use: No   Sexual activity: Not on file  Other Topics Concern   Not on file  Social History Narrative   Not on file   Social Determinants of Health   Financial Resource Strain: Not on file  Food Insecurity: No Food Insecurity (08/05/2022)   Hunger Vital Sign    Worried About Running Out of Food in the Last Year: Never true    Ran Out of Food in the Last Year: Never true  Transportation Needs: No Transportation Needs (08/05/2022)   PRAPARE - Administrator, Civil Service (Medical): No    Lack of Transportation (Non-Medical): No  Physical Activity: Not on file  Stress: Not on file  Social Connections: Not on file   Review of systems General: negative for malaise, night  sweats, fever, chills, weight loss Neck: Negative for lumps, goiter,  pain and significant neck swelling Resp: Negative for cough, wheezing, dyspnea at rest CV: Negative for chest pain, leg swelling, palpitations, orthopnea GI: denies melena, hematochezia, nausea, vomiting, diarrhea, constipation, dysphagia, odyonophagia, early satiety or unintentional weight loss.  MSK: Negative for joint pain or swelling, back pain, and muscle pain. Derm: Negative for itching or rash Psych: Denies depression, anxiety, memory loss, confusion. No homicidal or suicidal ideation.  Heme: Negative for prolonged bleeding, bruising easily, and swollen nodes. Endocrine: Negative for cold or heat intolerance, polyuria, polydipsia and goiter. Neuro: negative for tremor, gait imbalance, syncope and seizures. The remainder of the review of systems is noncontributory.  Physical Exam: BP 121/63 (BP Location: Right Arm, Patient Position: Sitting, Cuff Size: Large)   Pulse 65   Temp 98.1 F (36.7 C) (Oral)   Ht 5\' 5"  (1.651 m)   Wt 179 lb 11.2 oz (81.5 kg)   BMI 29.90 kg/m  General:   Alert and oriented. No distress noted. Pleasant and cooperative.  Head:  Normocephalic and atraumatic. Eyes:  Conjuctiva clear without scleral icterus. Mouth:  Oral mucosa pink and moist. Good dentition. No lesions. Heart: Normal rate and rhythm, s1 and s2 heart sounds present.  Lungs: Clear lung sounds in all lobes. Respirations equal and unlabored. Abdomen:  +BS, soft, non-tender and non-distended. No rebound or guarding. No HSM or masses noted. Derm: No palmar erythema or jaundice Msk:  Symmetrical without gross deformities. Normal posture. Extremities:  Without edema. Neurologic:  Alert and  oriented x4 Psych:  Alert and cooperative. Normal mood and affect.  Invalid input(s): "6 MONTHS"   ASSESSMENT: ASHTYNN SCHNELL is a 89 y.o. female presenting today for hospital follow up for IDA.  Significant, symptomatic IDA at the end  of February, prompting EGD/Capsule study and colonoscopy without obvious source of bleeding identified. She was transfused 2 units PRBCs for hgb of 5.2 during her admission and referred to hematology. She has received 2 iron infusions with feraheme. Her last hgb was 10.3, she has upcoming labs and visit with hematology. She is taking her PPI daily and Iron PO BID. She denies rectal bleeding or melena. Appetite is good. She has no GI complaints today. It is difficult for her to travel to [place] from [place] given her age/health issues. As she had no obvious GI sources of bleeding and hematology is managing her IDA, I consider we can plan to see her back in 1 year unless something changes. Patient and her son are amenable to this plan.    PLAN:  Continue with PO iron BID  2. Continue to follow with hematolgoy  3. Continue PPI  4. Pt to make me aware of any arising GI issues   All questions were answered, patient verbalized understanding and is in agreement with plan as outlined above.    Follow Up: 1 year   Pearley Millington L. Jeanmarie Hubert, MSN, APRN, AGNP-C Adult-Gerontology Nurse Practitioner [facility]  I have reviewed the note and agree with the APP's assessment as described in this progress note  Katrinka Blazing, MD Gastroenterology and Hepatology [facility]

## 2022-10-14 ENCOUNTER — Inpatient Hospital Stay: Payer: Medicare Other | Attending: Hematology

## 2022-10-14 DIAGNOSIS — D508 Other iron deficiency anemias: Secondary | ICD-10-CM

## 2022-10-14 DIAGNOSIS — R7402 Elevation of levels of lactic acid dehydrogenase (LDH): Secondary | ICD-10-CM | POA: Insufficient documentation

## 2022-10-14 DIAGNOSIS — N1832 Chronic kidney disease, stage 3b: Secondary | ICD-10-CM | POA: Diagnosis not present

## 2022-10-14 DIAGNOSIS — D500 Iron deficiency anemia secondary to blood loss (chronic): Secondary | ICD-10-CM

## 2022-10-14 DIAGNOSIS — Z800 Family history of malignant neoplasm of digestive organs: Secondary | ICD-10-CM | POA: Insufficient documentation

## 2022-10-14 DIAGNOSIS — Z86711 Personal history of pulmonary embolism: Secondary | ICD-10-CM | POA: Diagnosis not present

## 2022-10-14 DIAGNOSIS — Z8042 Family history of malignant neoplasm of prostate: Secondary | ICD-10-CM | POA: Diagnosis not present

## 2022-10-14 DIAGNOSIS — D649 Anemia, unspecified: Secondary | ICD-10-CM | POA: Diagnosis present

## 2022-10-14 LAB — CBC WITH DIFFERENTIAL/PLATELET
Abs Immature Granulocytes: 0.01 10*3/uL (ref 0.00–0.07)
Basophils Absolute: 0 10*3/uL (ref 0.0–0.1)
Basophils Relative: 0 %
Eosinophils Absolute: 0.2 10*3/uL (ref 0.0–0.5)
Eosinophils Relative: 5 %
HCT: 31.3 % — ABNORMAL LOW (ref 36.0–46.0)
Hemoglobin: 9.9 g/dL — ABNORMAL LOW (ref 12.0–15.0)
Immature Granulocytes: 0 %
Lymphocytes Relative: 36 %
Lymphs Abs: 1.6 10*3/uL (ref 0.7–4.0)
MCH: 30.9 pg (ref 26.0–34.0)
MCHC: 31.6 g/dL (ref 30.0–36.0)
MCV: 97.8 fL (ref 80.0–100.0)
Monocytes Absolute: 0.4 10*3/uL (ref 0.1–1.0)
Monocytes Relative: 9 %
Neutro Abs: 2.3 10*3/uL (ref 1.7–7.7)
Neutrophils Relative %: 50 %
Platelets: 198 10*3/uL (ref 150–400)
RBC: 3.2 MIL/uL — ABNORMAL LOW (ref 3.87–5.11)
RDW: 15.4 % (ref 11.5–15.5)
WBC: 4.6 10*3/uL (ref 4.0–10.5)
nRBC: 0 % (ref 0.0–0.2)

## 2022-10-14 LAB — COMPREHENSIVE METABOLIC PANEL
ALT: 14 U/L (ref 0–44)
AST: 21 U/L (ref 15–41)
Albumin: 3.4 g/dL — ABNORMAL LOW (ref 3.5–5.0)
Alkaline Phosphatase: 60 U/L (ref 38–126)
Anion gap: 7 (ref 5–15)
BUN: 20 mg/dL (ref 8–23)
CO2: 26 mmol/L (ref 22–32)
Calcium: 8.4 mg/dL — ABNORMAL LOW (ref 8.9–10.3)
Chloride: 104 mmol/L (ref 98–111)
Creatinine, Ser: 1.24 mg/dL — ABNORMAL HIGH (ref 0.44–1.00)
GFR, Estimated: 42 mL/min — ABNORMAL LOW (ref >60)
Glucose, Bld: 108 mg/dL — ABNORMAL HIGH (ref 70–99)
Potassium: 3.9 mmol/L (ref 3.5–5.1)
Sodium: 137 mmol/L (ref 135–145)
Total Bilirubin: 0.5 mg/dL (ref 0.3–1.2)
Total Protein: 6.6 g/dL (ref 6.5–8.1)

## 2022-10-14 LAB — LACTATE DEHYDROGENASE: LDH: 150 U/L (ref 98–192)

## 2022-10-14 LAB — RETICULOCYTES
Immature Retic Fract: 13.5 % (ref 2.3–15.9)
RBC.: 3.24 MIL/uL — ABNORMAL LOW (ref 3.87–5.11)
Retic Count, Absolute: 57 10*3/uL (ref 19.0–186.0)
Retic Ct Pct: 1.8 % (ref 0.4–3.1)

## 2022-10-14 LAB — IRON AND TIBC
Iron: 27 ug/dL — ABNORMAL LOW (ref 28–170)
Saturation Ratios: 7 % — ABNORMAL LOW (ref 10.4–31.8)
TIBC: 365 ug/dL (ref 250–450)
UIBC: 338 ug/dL

## 2022-10-14 LAB — FERRITIN: Ferritin: 13 ng/mL (ref 11–307)

## 2022-10-14 LAB — DIRECT ANTIGLOBULIN TEST (NOT AT ARMC)
DAT, IgG: NEGATIVE
DAT, complement: NEGATIVE

## 2022-10-14 NOTE — Progress Notes (Unsigned)
VIRTUAL VISIT via TELEPHONE NOTE [facility]   I connected with Valerie Griffin  on 10/18/22 at  9:02 AM  by telephone and verified that I am speaking with the correct person using two identifiers.  Location: Patient: Home Provider: [facility]   I discussed the limitations, risks, security and privacy concerns of performing an evaluation and management service by telephone and the availability of in person appointments. I also discussed with the patient that there may be a patient responsible charge related to this service. The patient expressed understanding and agreed to proceed.  REASON FOR VISIT:  Follow-up for severe normocytic anemia   CURRENT THERAPY: Oral iron, IV iron, intermittent PRBC transfusions  INTERVAL HISTORY:  Ms. Valerie Griffin is contacted today for follow-up of her severe normocytic anemia.  She was last seen by Rojelio Brenner, PA-C on 09/14/2022.  Her most recent IV iron was with Feraheme x2 on 08/18/2022 and 08/25/2022.  At today's visit, she reports feeling fairly well.  No recent hospitalizations, surgeries, or changes in baseline health status.   She felt some improved energy after IV iron (Feraheme on 08/18/2022 and 08/25/2022), but not quite back to normal.  She denies any obvious melena or hematochezia.  She has occasional "normal" headaches and notes some gait instability and poor sleep quality.   She denies any pica, restless legs, chest pain, dyspnea on exertion, lightheadedness, or syncope.   She has 75% energy and 100% appetite. She endorses that she is maintaining a stable weight.  REVIEW OF SYSTEMS:   Review of Systems  Constitutional:  Positive for malaise/fatigue. Negative for chills, diaphoresis, fever and weight loss.  Respiratory:  Negative for cough and shortness of breath.   Cardiovascular:  Negative for chest pain and palpitations.  Gastrointestinal:  Negative for abdominal pain, blood in stool, melena, nausea  and vomiting.  Skin:  Positive for itching.  Neurological:  Positive for tingling. Negative for dizziness and headaches.     PHYSICAL EXAM: (per limitations of virtual telephone visit)  The patient is alert and oriented x 3, exhibiting adequate mentation, good mood, and ability to speak in full sentences and execute sound judgement.  ASSESSMENT & PLAN:  1.  Severe normocytic anemia: - Patient seen at the request of Dr. Irene Limbo (hospitalist) after hospitalization for from 07/20/2022 - 07/23/2022 for acute blood loss anemia secondary to gastrointestinal hemorrhage (presumed upper GI bleed with melena) Critical anemia on 07/20/2022 with Hgb 5.2, s/p 2 units PRBC EGD (07/21/2022) noncritical peptic stricture, large hiatal hernia.  No bleeding lesion in upper GI tract. Small bowel Givens capsule study (07/22/2022): Melena noted, but no obvious upper or mid bowel source of melena. Colonoscopy (07/23/2022): Polyps x 6, multiple largemouth and small mouth diverticula in sigmoid colon - She was previously seen at [facility] by Dr. Angelene Giovanni, received Feraheme on 06/04/2020 and 06/15/2020.  Previously received PRBC x 2 at [facility] - Hematology workup (08/05/2022): Low haptoglobin <10, DAT IgG positive (complement negative).  LDH mildly elevated 218.  (Bilirubin not checked) - consider that these may have been false positives after recent blood transfusions, especially since repeat hemolysis labs were NEGATIVE  Reticulocytes 1.4% Ferritin 20, iron saturation 23%, TIBC 459.  Elevated B12 1040, normal MMA.  Normal folate and copper. SPEP and immunofixation negative.  Kappa light chains elevated at 40.1, with normal lambda 26.0, normal FLC ratio 1.54. Prior labs showed CKD stage IIIa/b - She has been taking oral iron therapy for many  years (currently taking ferrous sulfate twice daily).   - Received IV iron with Feraheme x 2 on 08/18/2022 and 08/25/2022. -- She denies any rectal bleeding or melena - Most  recent CBC/differential (10/14/2022) shows improved Hgb 9.9/MCV 97.8, ferritin 13, iron saturation 7%.  Reticulocytes 1.8% (insufficient response to degree of anemia), DAT negative, normal LDH, normal bilirubin.  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  Will check 24-hr urine/UPEP as well.   2.  Pulmonary embolism: - Diagnosis in 07/2020.  Treated with 6 months of Xarelto.  Unclear by history whether it was provoked or unprovoked.   3.  Social/family history: - She lives at home with her son.  Walks with the help of walker.  She is able to wash dishes, and occasionally cooks.  Prior to retirement, she did secretarial work in an office.  Never smoker.  She is accompanied by her son today. - No family history of anemia.  Daughter had anal cancer.  Father had prostate cancer.  Great grandfather had cancer.  Son died of stomach cancer.   PLAN SUMMARY: >> Feraheme x3  >> Labs in 8 weeks = CBC/D, BB sample, ferritin, iron/TIBC >> OFFICE visit in 8 weeks (same day as labs)      I discussed the assessment and treatment plan with the patient. The patient was provided an opportunity to ask questions and all were answered. The patient agreed with the plan and demonstrated an understanding of the instructions.   The patient was advised to call back or seek an in-person evaluation if the symptoms worsen or if the condition fails to improve as anticipated.  I provided 22 minutes of non-face-to-face time during this encounter.  Carnella Guadalajara, PA-C 10/18/22 9:15 AM

## 2022-10-18 ENCOUNTER — Other Ambulatory Visit: Payer: Self-pay

## 2022-10-18 ENCOUNTER — Encounter: Payer: Self-pay | Admitting: Physician Assistant

## 2022-10-18 ENCOUNTER — Inpatient Hospital Stay (HOSPITAL_BASED_OUTPATIENT_CLINIC_OR_DEPARTMENT_OTHER): Payer: Medicare Other | Admitting: Physician Assistant

## 2022-10-18 DIAGNOSIS — D500 Iron deficiency anemia secondary to blood loss (chronic): Secondary | ICD-10-CM

## 2022-10-18 DIAGNOSIS — D508 Other iron deficiency anemias: Secondary | ICD-10-CM

## 2022-10-18 DIAGNOSIS — D649 Anemia, unspecified: Secondary | ICD-10-CM

## 2022-11-03 ENCOUNTER — Inpatient Hospital Stay: Payer: Medicare Other

## 2022-11-11 ENCOUNTER — Inpatient Hospital Stay: Payer: Medicare Other

## 2022-11-14 ENCOUNTER — Ambulatory Visit (INDEPENDENT_AMBULATORY_CARE_PROVIDER_SITE_OTHER): Payer: Medicare Other | Admitting: Gastroenterology

## 2022-11-18 ENCOUNTER — Inpatient Hospital Stay: Payer: Medicare Other

## 2022-12-20 ENCOUNTER — Ambulatory Visit: Payer: Medicare Other | Admitting: Oncology

## 2022-12-20 ENCOUNTER — Other Ambulatory Visit: Payer: Medicare Other

## 2023-03-31 ENCOUNTER — Other Ambulatory Visit: Payer: Self-pay

## 2023-03-31 ENCOUNTER — Emergency Department (HOSPITAL_COMMUNITY): Payer: Medicare Other

## 2023-03-31 ENCOUNTER — Encounter (HOSPITAL_COMMUNITY): Payer: Self-pay

## 2023-03-31 ENCOUNTER — Observation Stay (HOSPITAL_COMMUNITY)
Admission: EM | Admit: 2023-03-31 | Discharge: 2023-04-02 | Disposition: A | Discharge status: Home / Self Care | Payer: Medicare Other | Attending: Internal Medicine | Admitting: Internal Medicine

## 2023-03-31 DIAGNOSIS — D500 Iron deficiency anemia secondary to blood loss (chronic): Secondary | ICD-10-CM | POA: Diagnosis not present

## 2023-03-31 DIAGNOSIS — J45909 Unspecified asthma, uncomplicated: Secondary | ICD-10-CM | POA: Diagnosis not present

## 2023-03-31 DIAGNOSIS — Z23 Encounter for immunization: Secondary | ICD-10-CM | POA: Diagnosis not present

## 2023-03-31 DIAGNOSIS — K259 Gastric ulcer, unspecified as acute or chronic, without hemorrhage or perforation: Secondary | ICD-10-CM | POA: Insufficient documentation

## 2023-03-31 DIAGNOSIS — N1832 Chronic kidney disease, stage 3b: Secondary | ICD-10-CM

## 2023-03-31 DIAGNOSIS — K219 Gastro-esophageal reflux disease without esophagitis: Secondary | ICD-10-CM | POA: Diagnosis present

## 2023-03-31 DIAGNOSIS — K449 Diaphragmatic hernia without obstruction or gangrene: Secondary | ICD-10-CM | POA: Diagnosis not present

## 2023-03-31 DIAGNOSIS — K2210 Ulcer of esophagus without bleeding: Principal | ICD-10-CM | POA: Insufficient documentation

## 2023-03-31 DIAGNOSIS — R0602 Shortness of breath: Secondary | ICD-10-CM | POA: Diagnosis present

## 2023-03-31 DIAGNOSIS — K3189 Other diseases of stomach and duodenum: Secondary | ICD-10-CM | POA: Diagnosis not present

## 2023-03-31 DIAGNOSIS — D508 Other iron deficiency anemias: Secondary | ICD-10-CM | POA: Diagnosis not present

## 2023-03-31 DIAGNOSIS — D509 Iron deficiency anemia, unspecified: Secondary | ICD-10-CM | POA: Diagnosis present

## 2023-03-31 DIAGNOSIS — D649 Anemia, unspecified: Secondary | ICD-10-CM | POA: Diagnosis not present

## 2023-03-31 DIAGNOSIS — K253 Acute gastric ulcer without hemorrhage or perforation: Secondary | ICD-10-CM

## 2023-03-31 DIAGNOSIS — Z79899 Other long term (current) drug therapy: Secondary | ICD-10-CM | POA: Diagnosis not present

## 2023-03-31 LAB — COMPREHENSIVE METABOLIC PANEL
ALT: 15 U/L (ref 0–44)
AST: 20 U/L (ref 15–41)
Albumin: 3.6 g/dL (ref 3.5–5.0)
Alkaline Phosphatase: 51 U/L (ref 38–126)
Anion gap: 10 (ref 5–15)
BUN: 23 mg/dL (ref 8–23)
CO2: 22 mmol/L (ref 22–32)
Calcium: 9.2 mg/dL (ref 8.9–10.3)
Chloride: 108 mmol/L (ref 98–111)
Creatinine, Ser: 1.36 mg/dL — ABNORMAL HIGH (ref 0.44–1.00)
GFR, Estimated: 38 mL/min — ABNORMAL LOW (ref >60)
Glucose, Bld: 98 mg/dL (ref 70–99)
Potassium: 4.2 mmol/L (ref 3.5–5.1)
Sodium: 140 mmol/L (ref 135–145)
Total Bilirubin: 0.3 mg/dL (ref <1.2)
Total Protein: 6.4 g/dL — ABNORMAL LOW (ref 6.5–8.1)

## 2023-03-31 LAB — CBC WITH DIFFERENTIAL/PLATELET
Abs Immature Granulocytes: 0.01 10*3/uL (ref 0.00–0.07)
Basophils Absolute: 0 10*3/uL (ref 0.0–0.1)
Basophils Relative: 0 %
Eosinophils Absolute: 0.1 10*3/uL (ref 0.0–0.5)
Eosinophils Relative: 3 %
HCT: 17.7 % — ABNORMAL LOW (ref 36.0–46.0)
Hemoglobin: 4.6 g/dL — CL (ref 12.0–15.0)
Immature Granulocytes: 0 %
Lymphocytes Relative: 21 %
Lymphs Abs: 1 10*3/uL (ref 0.7–4.0)
MCH: 21 pg — ABNORMAL LOW (ref 26.0–34.0)
MCHC: 26 g/dL — ABNORMAL LOW (ref 30.0–36.0)
MCV: 80.8 fL (ref 80.0–100.0)
Monocytes Absolute: 0.3 10*3/uL (ref 0.1–1.0)
Monocytes Relative: 6 %
Neutro Abs: 3.1 10*3/uL (ref 1.7–7.7)
Neutrophils Relative %: 70 %
Platelets: 303 10*3/uL (ref 150–400)
RBC: 2.19 MIL/uL — ABNORMAL LOW (ref 3.87–5.11)
RDW: 21.7 % — ABNORMAL HIGH (ref 11.5–15.5)
WBC: 4.5 10*3/uL (ref 4.0–10.5)
nRBC: 0 % (ref 0.0–0.2)

## 2023-03-31 LAB — IRON AND TIBC
Iron: 10 ug/dL — ABNORMAL LOW (ref 28–170)
Saturation Ratios: 2 % — ABNORMAL LOW (ref 10.4–31.8)
TIBC: 459 ug/dL — ABNORMAL HIGH (ref 250–450)
UIBC: 449 ug/dL

## 2023-03-31 LAB — RETICULOCYTES
Immature Retic Fract: 39.1 % — ABNORMAL HIGH (ref 2.3–15.9)
RBC.: 2.17 MIL/uL — ABNORMAL LOW (ref 3.87–5.11)
Retic Count, Absolute: 70.7 10*3/uL (ref 19.0–186.0)
Retic Ct Pct: 3.3 % — ABNORMAL HIGH (ref 0.4–3.1)

## 2023-03-31 LAB — VITAMIN B12: Vitamin B-12: 420 pg/mL (ref 180–914)

## 2023-03-31 LAB — FERRITIN: Ferritin: 14 ng/mL (ref 11–307)

## 2023-03-31 LAB — POC OCCULT BLOOD, ED: Fecal Occult Bld: NEGATIVE

## 2023-03-31 LAB — PREPARE RBC (CROSSMATCH)

## 2023-03-31 MED ORDER — PANTOPRAZOLE SODIUM 40 MG IV SOLR
40.0000 mg | INTRAVENOUS | Status: DC
  Administered 2023-03-31 – 2023-04-01 (×2): 40 mg via INTRAVENOUS
  Filled 2023-03-31 (×2): qty 10

## 2023-03-31 MED ORDER — ACETAMINOPHEN 650 MG RE SUPP: 650.0000 mg | Freq: Four times a day (QID) | RECTAL | Status: DC | PRN

## 2023-03-31 MED ORDER — ONDANSETRON HCL 4 MG/2ML IJ SOLN: 4.0000 mg | Freq: Four times a day (QID) | INTRAMUSCULAR | Status: DC | PRN

## 2023-03-31 MED ORDER — PANTOPRAZOLE SODIUM 40 MG IV SOLR: 40.0000 mg | INTRAVENOUS | Status: DC

## 2023-03-31 MED ORDER — INFLUENZA VAC A&B SURF ANT ADJ 0.5 ML IM SUSY
0.5000 mL | PREFILLED_SYRINGE | INTRAMUSCULAR | Status: AC
  Administered 2023-04-01: 0.5 mL via INTRAMUSCULAR
  Filled 2023-03-31: qty 0.5

## 2023-03-31 MED ORDER — ONDANSETRON HCL 4 MG PO TABS: 4.0000 mg | ORAL_TABLET | Freq: Four times a day (QID) | ORAL | Status: DC | PRN

## 2023-03-31 MED ORDER — CHLORHEXIDINE GLUCONATE CLOTH 2 % EX PADS
6.0000 | MEDICATED_PAD | Freq: Every day | CUTANEOUS | Status: DC
  Administered 2023-03-31 – 2023-04-02 (×2): 6 via TOPICAL

## 2023-03-31 MED ORDER — FUROSEMIDE 10 MG/ML IJ SOLN: 20.0000 mg | Freq: Once | INTRAMUSCULAR | Status: DC

## 2023-03-31 MED ORDER — ACETAMINOPHEN 325 MG PO TABS
650.0000 mg | ORAL_TABLET | Freq: Four times a day (QID) | ORAL | Status: DC | PRN
  Administered 2023-03-31 – 2023-04-02 (×3): 650 mg via ORAL
  Filled 2023-03-31 (×3): qty 2

## 2023-03-31 MED ORDER — ALBUTEROL SULFATE (2.5 MG/3ML) 0.083% IN NEBU
2.5000 mg | INHALATION_SOLUTION | RESPIRATORY_TRACT | Status: DC | PRN
Start: 2023-03-31 — End: 2023-04-02

## 2023-03-31 MED ORDER — SODIUM CHLORIDE 0.9% IV SOLUTION: Freq: Once | INTRAVENOUS | Status: DC

## 2023-03-31 MED ORDER — FUROSEMIDE 10 MG/ML IJ SOLN
20.0000 mg | Freq: Once | INTRAMUSCULAR | Status: AC
  Administered 2023-04-01: 20 mg via INTRAVENOUS
  Filled 2023-03-31: qty 2

## 2023-03-31 MED ORDER — PNEUMOCOCCAL 20-VAL CONJ VACC 0.5 ML IM SUSY
0.5000 mL | PREFILLED_SYRINGE | INTRAMUSCULAR | Status: AC
  Administered 2023-04-01: 0.5 mL via INTRAMUSCULAR
  Filled 2023-03-31: qty 0.5

## 2023-03-31 MED ORDER — FUROSEMIDE 10 MG/ML IJ SOLN
20.0000 mg | Freq: Once | INTRAMUSCULAR | Status: AC
  Administered 2023-03-31: 20 mg via INTRAVENOUS
  Filled 2023-03-31: qty 2

## 2023-03-31 MED ORDER — HYDRALAZINE HCL 20 MG/ML IJ SOLN: 5.0000 mg | INTRAMUSCULAR | Status: DC | PRN

## 2023-03-31 NOTE — ED Notes (Signed)
Lab called in reference to pt's blood. Lab states that it will be roughly an hour to finish up screening of blood before it is ready to transfuse. Pt's RN made aware

## 2023-03-31 NOTE — ED Provider Notes (Signed)
[place] EMERGENCY DEPARTMENT AT [facility] Provider Note   CSN: [id number] Arrival date & time: 03/31/23  1152     History  Chief Complaint  Patient presents with   Shortness of Breath    Valerie Griffin is a 89 y.o. female.  She presents ER today complaining of worsening shortness of breath on exertion, feels like prior symptomatic anemia when she had to have blood transfusions.  She still able to ambulate with a walker but states she gets a lot of shortness of breath with this.  Denies black or bloody stools.  She does take naps Nexium for GERD and has been compliant with this.  Takes iron daily.  Has had iron infusions and blood transfusion in the past for this.  States she has had endoscopy, colonoscopy and PillCam studies that did not show any definitive bleeding source.   Shortness of Breath      Home Medications Prior to Admission medications   Medication Sig Start Date End Date Taking? Authorizing Provider  Ascorbic Acid (VITAMIN C) 1000 MG tablet Take 1,000 mg by mouth daily.    [provider]  Cholecalciferol (VITAMIN D) 50 MCG (2000 UT) tablet Take 2,000 Units by mouth daily.    [provider]  docusate sodium (COLACE) 100 MG capsule Take 100 mg by mouth at bedtime.    [provider]  esomeprazole (NEXIUM) 20 MG capsule Take 1 capsule (20 mg total) by mouth 2 (two) times daily before a meal. Patient taking differently: Take 20 mg by mouth daily before breakfast. Takes once daily. Gets OTC 02/07/18   Rehman, Joline Maxcy, MD  gabapentin (NEURONTIN) 300 MG capsule Take 300 mg by mouth. Two capsules tid 10/19/21   [provider]  HYDROcodone-acetaminophen (NORCO/VICODIN) 5-325 MG tablet Take 1 tablet by mouth every 6 (six) hours as needed for moderate pain.    [provider]  Iron, Ferrous Sulfate, 325 (65 Fe) MG TABS Take 325 mg by mouth in the morning and at bedtime. 07/23/22   Standley Brooking, MD  montelukast  (SINGULAIR) 10 MG tablet Take 10 mg by mouth at bedtime.    [provider]  Multiple Vitamin (MULTIVITAMIN WITH MINERALS) TABS tablet Take 1 tablet by mouth daily. Centrum Silver    [provider]      Allergies    Penicillins    Review of Systems   Review of Systems  Respiratory:  Positive for shortness of breath.     Physical Exam Updated Vital Signs BP (!) 142/58 (BP Location: Left Arm)   Pulse 80   Temp 98.2 F (36.8 C)   Resp 16   Ht 5\' 5"  (1.651 m)   Wt 83.9 kg   SpO2 96%   BMI 30.79 kg/m  Physical Exam Vitals and nursing note reviewed.  Constitutional:      General: She is not in acute distress.    Appearance: She is well-developed.  HENT:     Head: Normocephalic and atraumatic.  Eyes:     General: Lids are normal.     Extraocular Movements: Extraocular movements intact.     Pupils: Pupils are equal, round, and reactive to light.     Comments: Pale palpebral conjunctiva bilaterally  Cardiovascular:     Rate and Rhythm: Normal rate and regular rhythm.     Heart sounds: No murmur heard. Pulmonary:     Effort: Pulmonary effort is normal. No respiratory distress.     Breath sounds:  Normal breath sounds.  Abdominal:     Palpations: Abdomen is soft.     Tenderness: There is no abdominal tenderness.  Genitourinary:    Rectum: Guaiac result negative.     Comments: Rectal exam chaperoned by paramedic Wandra Mannan, normal external rectal exam, no internal masses, no tenderness, small amount of brown stool in rectal vault, guaiac negative.  Control reacted appropriately. Musculoskeletal:        General: No swelling. Normal range of motion.     Cervical back: Neck supple.  Skin:    General: Skin is warm and dry.     Capillary Refill: Capillary refill takes less than 2 seconds.  Neurological:     General: No focal deficit present.     Mental Status: She is alert and oriented to person, place, and time.  Psychiatric:        Mood and Affect:  Mood normal.     ED Results / Procedures / Treatments   Labs (all labs ordered are listed, but only abnormal results are displayed) Labs Reviewed  CBC WITH DIFFERENTIAL/PLATELET - Abnormal; Notable for the following components:      Result Value   RBC 2.19 (*)    Hemoglobin 4.6 (*)    HCT 17.7 (*)    MCH 21.0 (*)    MCHC 26.0 (*)    RDW 21.7 (*)    All other components within normal limits  COMPREHENSIVE METABOLIC PANEL - Abnormal; Notable for the following components:   Creatinine, Ser 1.36 (*)    Total Protein 6.4 (*)    GFR, Estimated 38 (*)    All other components within normal limits  VITAMIN B12  FOLATE  IRON AND TIBC  FERRITIN  RETICULOCYTES  POC OCCULT BLOOD, ED  TYPE AND SCREEN  PREPARE RBC (CROSSMATCH)    EKG None  Radiology DG Chest 2 View  Result Date: 03/31/2023 CLINICAL DATA:  Shortness of breath. EXAM: CHEST - 2 VIEW COMPARISON:  October 29, 2015. FINDINGS: The heart size and mediastinal contours are within normal limits. Both lungs are clear. The visualized skeletal structures are unremarkable. IMPRESSION: No active cardiopulmonary disease. Electronically Signed   By: Lupita Raider M.D.   On: 03/31/2023 15:06    Procedures .Critical Care  Performed by: Ma Rings, PA-C Authorized by: Ma Rings, PA-C   Critical care provider statement:    Critical care time (minutes):  30   Critical care was necessary to treat or prevent imminent or life-threatening deterioration of the following conditions:  Circulatory failure   Critical care was time spent personally by me on the following activities:  Development of treatment plan with patient or surrogate, discussions with consultants, evaluation of patient's response to treatment, examination of patient, ordering and review of laboratory studies, ordering and review of radiographic studies, ordering and performing treatments and interventions, pulse oximetry, re-evaluation of patient's condition,  review of old charts and obtaining history from patient or surrogate   Care discussed with: admitting provider       Medications Ordered in ED Medications  0.9 %  sodium chloride infusion (Manually program via Guardrails IV Fluids) (has no administration in time range)    ED Course/ Medical Decision Making/ A&P                                 Medical Decision Making This patient presents to the ED for concern of shortness of  breath on exertion, this involves an extensive number of treatment options, and is a complaint that carries with it a high risk of complications and morbidity.  The differential diagnosis includes symptomatic anemia, ACS, pneumonia, PE, stable angina, other   Co morbidities that complicate the patient evaluation :   Anemia   Additional history obtained:  Additional history obtained from EMR External records from outside source obtained and reviewed including hematology notes, prior labs, prior GI notes   Lab Tests:  I Ordered, and personally interpreted labs.  The pertinent results include: Hemoglobin of 4.6, baseline renal function   Imaging Studies ordered:  I ordered imaging studies including Chest Xray which shows no pulmonary edema or infiltrate I independently visualized and interpreted imaging within scope of identifying emergent findings  I agree with the radiologist interpretation   Cardiac Monitoring: / EKG:  The patient was maintained on a cardiac monitor.  I personally viewed and interpreted the cardiac monitored which showed an underlying rhythm of: sinus rhythm   Consultations Obtained:  I requested consultation with the hospitalist Dr. Randol Kern,  and discussed lab and imaging findings as well as pertinent plan - they recommend: Admission   Problem List / ED Course / Critical interventions / Medication management  Patient having exertional dyspnea likely related to her severe anemia.  No evidence of GI bleeding.  She has had prior  GI and hematology workup.  Vitals are reassuring.  Patient will be admitted for transfusion.  We discussed risks and benefits of blood transfusion and patient is agreeable with this.  She has had transfusion in the past.  I have reviewed the patients home medicines and have made adjustments as needed   Amount and/or Complexity of Data Reviewed Labs: ordered. Radiology: ordered. ECG/medicine tests: ordered.  Risk Prescription drug management. Decision regarding hospitalization.           Final Clinical Impression(s) / ED Diagnoses Final diagnoses:  None    Rx / DC Orders ED Discharge Orders     None         Josem Kaufmann 03/31/23 1522    Glendora Score, MD 03/31/23 1721

## 2023-03-31 NOTE — H&P (Signed)
[facility] H&P   Patient Demographics:    Valerie Griffin, is a 89 y.o. female  MRN: [id number]   DOB - September 02, 1933  Admit Date - 03/31/2023  Outpatient Primary MD for the patient is Ignatius Specking, MD  Referring MD/NP/PA: PA Celeste  Outpatient Specialists: GI Dr Levon Hedger    Patient coming from: home, sent by PCP  Chief Complaint  Patient presents with   Shortness of Breath      HPI:    Valerie Griffin  is a 89 y.o. female, PMH including dysphagia, GERD, iron deficiency anemia, with recent hospitalization of May 2024, secondary to profound symptomatic anemia, required multiple units transfusion, seen and evaluated by GI ,  EGD was unrevealing and so capsule was placed.  Underwent colonoscopy which was unrevealing.  GI recommended outpatient hematology evaluation for IV iron transfusion, she received first transfusion, but then she failed to follow-up after that. Findings of her GI workup during hospitalization of May 2024. 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 Evidence of duodenitis and patchy erythema throughout the small bowel. Mild oozing likely secondary to scope for EGD (this area is likely at the area of peptic stricture). No obvious upper or mid bowel source of melena. Colonoscopy.  Polyps removed.  Patient had a follow-up with her PCP, has been complaining of worsening and progressive dyspnea, initially with exertion, currently even at rest, feels that this is prior to her symptomatic anemia prior to previous admission and transfusions, denies any black stool, tarry stool, melena, nausea, vomiting, emesis, denies chest pain as well.  Patient reports she is compliant with her PPI for GERD, reports she is current with p.o.  iron, patient had a follow-up with PCP who recommended her to come to the hospital for anemia evaluation -ED her workup significant for creatinine of 1.36, around her baseline, her hemoglobin was low at 4.6, platelet at 303K, blood pressure was stable at 142/58, she was Hemoccult negative in ED, she was typed and crossed, 3 units PRBC were ordered, and [facility] requested to admit   Review of systems:     A full 10 point Review of Systems was done, except as stated above, all other Review of Systems were negative.   With Past History of the following :    Past Medical History:  Diagnosis Date   Anemia    Arthritis    Constipation    Dysphagia 08/16/2016   Esophageal stenosis    GERD (gastroesophageal reflux disease)       Past Surgical History:  Procedure Laterality Date   ABDOMINAL HYSTERECTOMY     APPENDECTOMY     BALLOON DILATION N/A 06/11/2020   Procedure: BALLOON DILATION;  Surgeon: Malissa Hippo, MD;  [facility];  Service: Endoscopy;  Laterality: N/A;  broken arm and wrist with plate and scews rt wrist Right    CHOLECYSTECTOMY     COLONOSCOPY     COLONOSCOPY WITH PROPOFOL N/A 07/23/2022   Procedure: COLONOSCOPY WITH PROPOFOL;  Surgeon: Dolores Frame, MD;  [facility]: [facility];  Service: Gastroenterology;  Laterality: N/A;   complete hysterectomy'     fibroid tumors bleeding   ESOPHAGEAL DILATION N/A 08/22/2016   Procedure: ESOPHAGEAL DILATION;  Surgeon: Malissa Hippo, MD;  [facility]: [facility];  Service: Endoscopy;  Laterality: N/A;   ESOPHAGEAL DILATION N/A 09/14/2016   Procedure: ESOPHAGEAL DILATION;  Surgeon: Malissa Hippo, MD;  [facility];  Service: Endoscopy;  Laterality: N/A;   ESOPHAGEAL DILATION N/A 07/26/2017   Procedure: ESOPHAGEAL DILATION;  Surgeon: Malissa Hippo, MD;  [facility];  Service: Endoscopy;  Laterality: N/A;   ESOPHAGEAL DILATION N/A 08/31/2017   Procedure: ESOPHAGEAL DILATION;  Surgeon:  Malissa Hippo, MD;  [facility];  Service: Endoscopy;  Laterality: N/A;   ESOPHAGEAL DILATION N/A 10/26/2017   Procedure: ESOPHAGEAL DILATION;  Surgeon: Malissa Hippo, MD;  [facility]: [facility];  Service: Endoscopy;  Laterality: N/A;   ESOPHAGEAL DILATION N/A 02/07/2018   Procedure: ESOPHAGEAL DILATION;  Surgeon: Malissa Hippo, MD;  [facility]: [facility];  Service: Endoscopy;  Laterality: N/A;   ESOPHAGEAL DILATION N/A 05/31/2019   Procedure: ESOPHAGEAL DILATION;  Surgeon: Malissa Hippo, MD;  [facility];  Service: Endoscopy;  Laterality: N/A;   ESOPHAGEAL DILATION N/A 06/14/2019   Procedure: ESOPHAGEAL DILATION;  Surgeon: Malissa Hippo, MD;  [facility];  Service: Endoscopy;  Laterality: N/A;   ESOPHAGEAL DILATION N/A 07/04/2019   Procedure: ESOPHAGEAL DILATION;  Surgeon: Malissa Hippo, MD;  [facility];  Service: Endoscopy;  Laterality: N/A;  730   ESOPHAGEAL DILATION N/A 08/01/2019   Procedure: ESOPHAGEAL DILATION;  Surgeon: Malissa Hippo, MD;  [facility];  Service: Endoscopy;  Laterality: N/A;   ESOPHAGEAL DILATION N/A 05/14/2020   Procedure: ESOPHAGEAL DILATION;  Surgeon: Malissa Hippo, MD;  [facility];  Service: Endoscopy;  Laterality: N/A;   ESOPHAGOGASTRODUODENOSCOPY N/A 08/22/2016   Procedure: ESOPHAGOGASTRODUODENOSCOPY (EGD);  Surgeon: Malissa Hippo, MD;  [facility];  Service: Endoscopy;  Laterality: N/A;   ESOPHAGOGASTRODUODENOSCOPY N/A 09/14/2016   Procedure: ESOPHAGOGASTRODUODENOSCOPY (EGD);  Surgeon: Malissa Hippo, MD;  [facility];  Service: Endoscopy;  Laterality: N/A;  1225   ESOPHAGOGASTRODUODENOSCOPY N/A 07/26/2017   Procedure: ESOPHAGOGASTRODUODENOSCOPY (EGD);  Surgeon: Malissa Hippo, MD;  [facility];  Service: Endoscopy;  Laterality: N/A;  1:45   ESOPHAGOGASTRODUODENOSCOPY N/A 08/31/2017   Procedure: ESOPHAGOGASTRODUODENOSCOPY (EGD);   Surgeon: Malissa Hippo, MD;  [facility];  Service: Endoscopy;  Laterality: N/A;   ESOPHAGOGASTRODUODENOSCOPY N/A 10/26/2017   Procedure: ESOPHAGOGASTRODUODENOSCOPY (EGD);  Surgeon: Malissa Hippo, MD;  [facility];  Service: Endoscopy;  Laterality: N/A;  1115   ESOPHAGOGASTRODUODENOSCOPY N/A 02/07/2018   Procedure: ESOPHAGOGASTRODUODENOSCOPY (EGD);  Surgeon: Malissa Hippo, MD;  [facility];  Service: Endoscopy;  Laterality: N/A;  830   ESOPHAGOGASTRODUODENOSCOPY N/A 07/04/2019   Procedure: ESOPHAGOGASTRODUODENOSCOPY (EGD);  Surgeon: Malissa Hippo, MD;  [facility];  Service: Endoscopy;  Laterality: N/A;  730   ESOPHAGOGASTRODUODENOSCOPY N/A 08/01/2019   Procedure: ESOPHAGOGASTRODUODENOSCOPY (EGD);  Surgeon: Malissa Hippo, MD;  [facility];  Service: Endoscopy;  Laterality:  N/A;  125   ESOPHAGOGASTRODUODENOSCOPY N/A 05/14/2020   Procedure: ESOPHAGOGASTRODUODENOSCOPY (EGD);  Surgeon: Malissa Hippo, MD;  [facility];  Service: Endoscopy;  Laterality: N/A;  10:30   ESOPHAGOGASTRODUODENOSCOPY N/A 06/11/2020   Procedure: ESOPHAGOGASTRODUODENOSCOPY (EGD);  Surgeon: Malissa Hippo, MD;  [facility];  Service: Endoscopy;  Laterality: N/A;  7:30   ESOPHAGOGASTRODUODENOSCOPY (EGD) WITH PROPOFOL N/A 05/31/2019   Procedure: ESOPHAGOGASTRODUODENOSCOPY (EGD) WITH PROPOFOL;  Surgeon: Malissa Hippo, MD;  [facility];  Service: Endoscopy;  Laterality: N/A;  2:35   ESOPHAGOGASTRODUODENOSCOPY (EGD) WITH PROPOFOL N/A 06/14/2019   Procedure: ESOPHAGOGASTRODUODENOSCOPY (EGD) WITH PROPOFOL;  Surgeon: Malissa Hippo, MD;  [facility];  Service: Endoscopy;  Laterality: N/A;   ESOPHAGOGASTRODUODENOSCOPY (EGD) WITH PROPOFOL N/A 07/21/2022   Procedure: ESOPHAGOGASTRODUODENOSCOPY (EGD) WITH PROPOFOL;  Surgeon: Corbin Ade, MD;  [facility];  Service: Endoscopy;  Laterality: N/A;   FOREIGN  BODY REMOVAL  05/31/2019   Procedure: FOREIGN BODY REMOVAL;  Surgeon: Malissa Hippo, MD;  [facility];  Service: Endoscopy;;   GIVENS CAPSULE STUDY N/A 07/21/2022   Procedure: GIVENS CAPSULE STUDY;  Surgeon: Corbin Ade, MD;  [facility];  Service: Endoscopy;  Laterality: N/A;   POLYPECTOMY  07/23/2022   Procedure: POLYPECTOMY;  Surgeon: Dolores Frame, MD;  [facility];  Service: Gastroenterology;;      Social History:     Social History   Tobacco Use   Smoking status: Never    Passive exposure: Current   Smokeless tobacco: Never  Substance Use Topics   Alcohol use: No        Family History :     Family History  Problem Relation Age of Onset   Heart Problems Mother    Alzheimer's disease Mother    Hypertension Mother    Prostate cancer Father    Heart attack Father    Heart Problems Sister      Home Medications:   Prior to Admission medications   Medication Sig Start Date End Date Taking? Authorizing Provider  Ascorbic Acid (VITAMIN C) 1000 MG tablet Take 1,000 mg by mouth daily.    [provider]  Cholecalciferol (VITAMIN D) 50 MCG (2000 UT) tablet Take 2,000 Units by mouth daily.    [provider]  docusate sodium (COLACE) 100 MG capsule Take 100 mg by mouth at bedtime.    [provider]  esomeprazole (NEXIUM) 20 MG capsule Take 1 capsule (20 mg total) by mouth 2 (two) times daily before a meal. Patient taking differently: Take 20 mg by mouth daily before breakfast. Takes once daily. Gets OTC 02/07/18   Rehman, Joline Maxcy, MD  gabapentin (NEURONTIN) 300 MG capsule Take 300 mg by mouth 3 (three) times daily as needed (pain). 2 capsules at bedtime and 2 more during the day as needed 10/19/21   [provider]  HYDROcodone-acetaminophen (NORCO/VICODIN) 5-325 MG tablet Take 1 tablet by mouth every 6 (six) hours as needed for moderate pain.    [provider]  Iron, Ferrous  Sulfate, 325 (65 Fe) MG TABS Take 325 mg by mouth in the morning and at bedtime. 07/23/22   Standley Brooking, MD  montelukast (SINGULAIR) 10 MG tablet Take 10 mg by mouth at bedtime.    [provider]  Multiple Vitamin (MULTIVITAMIN WITH MINERALS) TABS tablet Take 1 tablet by mouth daily. Centrum Silver    [provider]  Allergies:     Allergies  Allergen Reactions   Penicillins Rash and Other (See Comments)    40 years ago, caused rash and tachycardia      Physical Exam:   Vitals  Blood pressure (!) 142/58, pulse 80, temperature 98.2 F (36.8 C), resp. rate 16, height 5\' 5"  (1.651 m), weight 83.9 kg, SpO2 96%.   1. General Elderly female, laying in bed, no apparent distress, pale, frail  2. Normal affect and insight, Not Suicidal or Homicidal, Awake Alert, Oriented X 3.  3. No F.N deficits, ALL C.Nerves Intact, Strength 5/5 all 4 extremities, Sensation intact all 4 extremities, Plantars down going.  4. Ears and Eyes appear Normal, Conjunctivae clear, but pale, PERRLA. Moist Oral Mucosa.  5. Supple Neck, No JVD, No cervical lymphadenopathy appriciated, No Carotid Bruits.  6. Symmetrical Chest wall movement, Good air movement bilaterally, CTAB.  7. RRR, No Gallops, Rubs or Murmurs, No Parasternal Heave.  8. Positive Bowel Sounds, Abdomen Soft, No tenderness, No organomegaly appriciated,No rebound -guarding or rigidity.  9.  No Cyanosis, Normal Skin Turgor, No Skin Rash or Bruise.  10. Good muscle tone,  joints appear normal , no effusions, Normal ROM.    Data Review:    CBC Recent Labs  Lab 03/31/23 1238  WBC 4.5  HGB 4.6*  HCT 17.7*  PLT 303  MCV 80.8  MCH 21.0*  MCHC 26.0*  RDW 21.7*  LYMPHSABS 1.0  MONOABS 0.3  EOSABS 0.1  BASOSABS 0.0   ------------------------------------------------------------------------------------------------------------------  Chemistries  Recent Labs  Lab 03/31/23 1238  NA 140  K 4.2  CL 108   CO2 22  GLUCOSE 98  BUN 23  CREATININE 1.36*  CALCIUM 9.2  AST 20  ALT 15  ALKPHOS 51  BILITOT 0.3   ------------------------------------------------------------------------------------------------------------------ estimated creatinine clearance is 31.2 mL/min (A) (by C-G formula based on SCr of 1.36 mg/dL (H)). ------------------------------------------------------------------------------------------------------------------ No results for input(s): "TSH", "T4TOTAL", "T3FREE", "THYROIDAB" in the last 72 hours.  Invalid input(s): "FREET3"  Coagulation profile No results for input(s): "INR", "PROTIME" in the last 168 hours. ------------------------------------------------------------------------------------------------------------------- No results for input(s): "DDIMER" in the last 72 hours. -------------------------------------------------------------------------------------------------------------------  Cardiac Enzymes No results for input(s): "CKMB", "TROPONINI", "MYOGLOBIN" in the last 168 hours.  Invalid input(s): "CK" ------------------------------------------------------------------------------------------------------------------ No results found for: "BNP"   ---------------------------------------------------------------------------------------------------------------  Urinalysis No results found for: "COLORURINE", "APPEARANCEUR", "LABSPEC", "PHURINE", "GLUCOSEU", "HGBUR", "BILIRUBINUR", "KETONESUR", "PROTEINUR", "UROBILINOGEN", "NITRITE", "LEUKOCYTESUR"  ----------------------------------------------------------------------------------------------------------------   Imaging Results:    DG Chest 2 View  Result Date: 03/31/2023 CLINICAL DATA:  Shortness of breath. EXAM: CHEST - 2 VIEW COMPARISON:  October 29, 2015. FINDINGS: The heart size and mediastinal contours are within normal limits. Both lungs are clear. The visualized skeletal structures are unremarkable.  IMPRESSION: No active cardiopulmonary disease. Electronically Signed   By: Lupita Raider M.D.   On: 03/31/2023 15:06     EKG:  PR interval 206 ms QRS duration 140 ms QT/QTcB 394/454 ms P-R-T axes 72 -41 73 Normal sinus rhythm Left axis deviation Right bundle branch block Minimal voltage criteria for LVH, may be normal variant ( R in aVL ) Abnormal ECG No previous ECGs available  Assessment & Plan:    Principal Problem:   Symptomatic anemia Active Problems:   GERD (gastroesophageal reflux disease)   IDA (iron deficiency anemia)  Symptomatic anemia  Deficiency anemia  Chronic  blood loss anemia  -Patient presents with symptomatic anemia, progressive dyspnea, increased weakness and fatigue -Hospitalization in May of this year with  unrevealing GI workup 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 Evidence of duodenitis and patchy erythema throughout the small bowel. Mild oozing likely secondary to scope for EGD (this area is likely at the area of peptic stricture). No obvious upper or mid bowel source of melena. Colonoscopy.  Polyps removed. She is symptomatic with hemoglobin of 4.6, her Hemoccult is negative.  Any melena, coffee-ground emesis, no evidence of active GI bleed -Will obtain anemia panel -Transfuse 3 units PRBC, will give IV Lasix to avoid volume overload  -Repeat CBC after transfusion -Continue with PPI -I have discussed with her, she needs to keep her appointment with hematology as an outpatient for IV iron. -I did request GI to evaluate this patient to see if any workup is indicated she is in the hospital.  CKD stage IIIa -creatinine at baseline       DVT Prophylaxis  SCDs   AM Labs Ordered, also please review Full Orders  Family Communication: Admission, patients condition and plan of care including  tests being ordered have been discussed with the patient  who indicate understanding and agree with the plan and Code Status.  Code Status Full  Likely DC to  home  Condition GUARDED    Consults called: GI  Admission status: observation    Time spent in minutes : 60 minutes   Huey Bienenstock M.D on 03/31/2023 at 3:53 PM   [facility] - Office  [phone]

## 2023-03-31 NOTE — ED Triage Notes (Signed)
Pt came from PCP PCP stated anemia and needs blood transfusion  Pt complains of SOB x 2 weeks Increasingly getting worse Doesn't wear home O2

## 2023-03-31 NOTE — ED Provider Triage Note (Signed)
Emergency Medicine Provider Triage Evaluation Note  Valerie Griffin , a 89 y.o. female  was evaluated in triage.  Pt complains of gradually worsening shortness of breath on exertion, feels like when she needed blood transfusion previously, no leg swelling, no chest pain, no dizziness or palpitations.  Review of Systems  Positive: Shortness of breath Negative: As above  Physical Exam  BP (!) 142/58 (BP Location: Left Arm)   Pulse 80   Temp 98.2 F (36.8 C)   Resp 16   Ht 5\' 5"  (1.651 m)   Wt 83.9 kg   SpO2 96%   BMI 30.79 kg/m  Gen:   Awake, no distress   Resp:  Normal effort, CTA bilaterally MSK:   Moves extremities without difficulty  Other:  Heart RRR,   Medical Decision Making  Medically screening exam initiated at 12:19 PM.  Appropriate orders placed.  Valerie Griffin was informed that the remainder of the evaluation will be completed by another provider, this initial triage assessment does not replace that evaluation, and the importance of remaining in the ED until their evaluation is complete.     Ma Rings, [place] 03/31/23 1219

## 2023-04-01 ENCOUNTER — Encounter (HOSPITAL_COMMUNITY)
Admission: EM | Disposition: A | Discharge status: Home / Self Care | Payer: Self-pay | Source: Home / Self Care | Attending: Student

## 2023-04-01 ENCOUNTER — Observation Stay (HOSPITAL_COMMUNITY): Payer: Medicare Other | Admitting: Anesthesiology

## 2023-04-01 DIAGNOSIS — K2100 Gastro-esophageal reflux disease with esophagitis, without bleeding: Secondary | ICD-10-CM

## 2023-04-01 DIAGNOSIS — D649 Anemia, unspecified: Secondary | ICD-10-CM | POA: Diagnosis not present

## 2023-04-01 DIAGNOSIS — K3189 Other diseases of stomach and duodenum: Secondary | ICD-10-CM

## 2023-04-01 DIAGNOSIS — D509 Iron deficiency anemia, unspecified: Secondary | ICD-10-CM

## 2023-04-01 DIAGNOSIS — K2970 Gastritis, unspecified, without bleeding: Secondary | ICD-10-CM

## 2023-04-01 DIAGNOSIS — B9681 Helicobacter pylori [H. pylori] as the cause of diseases classified elsewhere: Secondary | ICD-10-CM | POA: Diagnosis not present

## 2023-04-01 DIAGNOSIS — K449 Diaphragmatic hernia without obstruction or gangrene: Secondary | ICD-10-CM

## 2023-04-01 DIAGNOSIS — K259 Gastric ulcer, unspecified as acute or chronic, without hemorrhage or perforation: Secondary | ICD-10-CM

## 2023-04-01 DIAGNOSIS — K2210 Ulcer of esophagus without bleeding: Secondary | ICD-10-CM | POA: Diagnosis not present

## 2023-04-01 HISTORY — PX: ENTEROSCOPY: SHX5533

## 2023-04-01 HISTORY — PX: BIOPSY: SHX5522

## 2023-04-01 LAB — CBC
HCT: 25.3 % — ABNORMAL LOW (ref 36.0–46.0)
Hemoglobin: 7.3 g/dL — ABNORMAL LOW (ref 12.0–15.0)
MCH: 22.6 pg — ABNORMAL LOW (ref 26.0–34.0)
MCHC: 28.9 g/dL — ABNORMAL LOW (ref 30.0–36.0)
MCV: 78.3 fL — ABNORMAL LOW (ref 80.0–100.0)
Platelets: 251 10*3/uL (ref 150–400)
RBC: 3.23 MIL/uL — ABNORMAL LOW (ref 3.87–5.11)
RDW: 19.5 % — ABNORMAL HIGH (ref 11.5–15.5)
WBC: 8 10*3/uL (ref 4.0–10.5)
nRBC: 0 % (ref 0.0–0.2)

## 2023-04-01 LAB — PREPARE RBC (CROSSMATCH)

## 2023-04-01 LAB — BASIC METABOLIC PANEL
Anion gap: 7 (ref 5–15)
BUN: 21 mg/dL (ref 8–23)
CO2: 24 mmol/L (ref 22–32)
Calcium: 8.5 mg/dL — ABNORMAL LOW (ref 8.9–10.3)
Chloride: 109 mmol/L (ref 98–111)
Creatinine, Ser: 1.26 mg/dL — ABNORMAL HIGH (ref 0.44–1.00)
GFR, Estimated: 41 mL/min — ABNORMAL LOW (ref >60)
Glucose, Bld: 89 mg/dL (ref 70–99)
Potassium: 3.4 mmol/L — ABNORMAL LOW (ref 3.5–5.1)
Sodium: 140 mmol/L (ref 135–145)

## 2023-04-01 LAB — MRSA NEXT GEN BY PCR, NASAL: MRSA by PCR Next Gen: NOT DETECTED

## 2023-04-01 LAB — FOLATE: Folate: 21.2 ng/mL (ref >5.9)

## 2023-04-01 SURGERY — ENTEROSCOPY
Anesthesia: General

## 2023-04-01 MED ORDER — SODIUM CHLORIDE 0.9% IV SOLUTION: Freq: Once | INTRAVENOUS | Status: AC

## 2023-04-01 MED ORDER — MELATONIN 3 MG PO TABS
6.0000 mg | ORAL_TABLET | Freq: Every day | ORAL | Status: DC
  Administered 2023-04-01: 6 mg via ORAL
  Filled 2023-04-01: qty 2

## 2023-04-01 MED ORDER — LACTATED RINGERS IV SOLN: INTRAVENOUS | Status: DC | PRN

## 2023-04-01 MED ORDER — PROPOFOL 500 MG/50ML IV EMUL
INTRAVENOUS | Status: AC
  Filled 2023-04-01: qty 50

## 2023-04-01 MED ORDER — GABAPENTIN 300 MG PO CAPS
300.0000 mg | ORAL_CAPSULE | Freq: Two times a day (BID) | ORAL | Status: DC
  Administered 2023-04-01 – 2023-04-02 (×2): 300 mg via ORAL
  Filled 2023-04-01 (×2): qty 1

## 2023-04-01 MED ORDER — GABAPENTIN 300 MG PO CAPS: 300.0000 mg | ORAL_CAPSULE | Freq: Three times a day (TID) | ORAL | Status: DC | PRN

## 2023-04-01 NOTE — Progress Notes (Signed)
PT Cancellation Note  Patient Details Name: Valerie Griffin MRN: [id number] DOB: 30-Jan-1934   Cancelled Treatment:    Reason Eval/Treat Not Completed: Patient at procedure or test/unavailable.  Patient receiving blood and to go to a procedure later.  Will check back tomorrow.  1:51 PM, 04/01/23 Ocie Bob, MPT Physical Therapist with [facility] [phone] office [phone] mobile phone

## 2023-04-01 NOTE — Plan of Care (Signed)
  Problem: Education: Goal: Knowledge of General Education information will improve Description: Including pain rating scale, medication(s)/side effects and non-pharmacologic comfort measures Outcome: Progressing   Problem: Health Behavior/Discharge Planning: Goal: Ability to manage health-related needs will improve Outcome: Progressing   Problem: Clinical Measurements: Goal: Ability to maintain clinical measurements within normal limits will improve Outcome: Progressing Goal: Will remain free from infection Outcome: Progressing Goal: Diagnostic test results will improve Outcome: Progressing Goal: Respiratory complications will improve Outcome: Progressing Goal: Cardiovascular complication will be avoided Outcome: Progressing   Problem: Activity: Goal: Risk for activity intolerance will decrease Outcome: Progressing   Problem: Nutrition: Goal: Adequate nutrition will be maintained Outcome: Not Progressing   Problem: Coping: Goal: Level of anxiety will decrease Outcome: Progressing   Problem: Elimination: Goal: Will not experience complications related to bowel motility Outcome: Progressing Goal: Will not experience complications related to urinary retention Outcome: Progressing   Problem: Pain Management: Goal: General experience of comfort will improve Outcome: Progressing   Problem: Safety: Goal: Ability to remain free from injury will improve Outcome: Progressing

## 2023-04-01 NOTE — Progress Notes (Signed)
Patient hemoglobin is 4.6, has orders for 3 units of blood transfusion, has antibodies on her blood so the blood has to be brought from [place], As per [employer] in lab, 3rd unit of blood that has been brought is not compatible, so 3rd unit not available to transfuse, 2 units successfully transfused without any reactions, MD made aware about the 3rd unit not being available, waiting for CBC 2 hours post transfusion which will be at around 5 AM, will decide after the results, will continue to monitor.

## 2023-04-01 NOTE — Transfer of Care (Signed)
Immediate Anesthesia Transfer of Care Note  Patient: JANAEH STEINHART  Procedure(s) Performed: ENTEROSCOPY BIOPSY  Patient Location: PACU  Anesthesia Type:General  Level of Consciousness: awake and alert   Airway & Oxygen Therapy: Patient Spontanous Breathing  Post-op Assessment: Report given to RN and Post -op Vital signs reviewed and stable  Post vital signs: Reviewed and stable  Last Vitals:  Vitals Value Taken Time  BP 138/62 04/01/23 1147  Temp 36.8 C 04/01/23 1147  Pulse 86 04/01/23 1156  Resp 27 04/01/23 1156  SpO2 95 % 04/01/23 1156  Vitals shown include unfiled device data.  Last Pain:  Vitals:   04/01/23 1147  TempSrc:   PainSc: 0-No pain      Patients Stated Pain Goal: 0 (04/01/23 0400)  Complications: No notable events documented.

## 2023-04-01 NOTE — Anesthesia Postprocedure Evaluation (Signed)
Anesthesia Post Note  Patient: Valerie Griffin  Procedure(s) Performed: ENTEROSCOPY BIOPSY  Patient location during evaluation: PACU Anesthesia Type: General Level of consciousness: awake and alert Pain management: pain level controlled Vital Signs Assessment: post-procedure vital signs reviewed and stable Respiratory status: spontaneous breathing, nonlabored ventilation, respiratory function stable and patient connected to nasal cannula oxygen Cardiovascular status: blood pressure returned to baseline and stable Postop Assessment: no apparent nausea or vomiting Anesthetic complications: no   No notable events documented.   Last Vitals:  Vitals:   04/01/23 1125 04/01/23 1147  BP: (!) 149/60 138/62  Pulse: 74 88  Resp: 18 (!) 21  Temp: 36.5 C 36.8 C  SpO2: 99% 96%    Last Pain:  Vitals:   04/01/23 1147  TempSrc:   PainSc: 0-No pain                 [name]

## 2023-04-01 NOTE — Op Note (Signed)
[facility] Patient Name: Valerie Griffin Procedure Date: 04/01/2023 11:18 AM MRN: [id number] Date of Birth: January 10, 1934 Attending MD: Katrinka Blazing , , [id number] CSN: [id number] Age: 89 Admit Type: Inpatient Procedure:                Small bowel enteroscopy Indications:              Iron deficiency anemia secondary to chronic blood                            loss Providers:                Katrinka Blazing, Nena Polio, RN, Cyril Mourning, Technician Referring MD:              Medicines:                Monitored Anesthesia Care Complications:            No immediate complications. Estimated Blood Loss:     Estimated blood loss: none. Procedure:                Pre-Anesthesia Assessment:                           - Prior to the procedure, a History and Physical                            was performed, and patient medications, allergies                            and sensitivities were reviewed. The patient's                            tolerance of previous anesthesia was reviewed.                           - The risks and benefits of the procedure and the                            sedation options and risks were discussed with the                            patient. All questions were answered and informed                            consent was obtained.                           - ASA Grade Assessment: II - A patient with mild                            systemic disease.                           After obtaining informed consent, the endoscope was  passed under direct vision. Throughout the                            procedure, the patient's blood pressure, pulse, and                            oxygen saturations were monitored continuously. The                            [phone]) scope was introduced through                            the mouth and advanced to the third part of                             duodenum. The small bowel enteroscopy was                            accomplished without difficulty. The patient                            tolerated the procedure well. Scope In: 11:34:05 AM Scope Out: 11:43:59 AM Total Procedure Duration: 0 hours 9 minutes 54 seconds  Findings:      One superficial esophageal ulcer with no stigmata of recent bleeding was       found at the gastroesophageal junction. The lesion was 10 mm in largest       dimension.      A 9 cm hiatal hernia was found. The hiatal narrowing was 38 cm from the       incisors. The Z-line was 29 cm from the incisors.      A few localized diminutive erosions with no stigmata of recent bleeding       were found in the gastric body.      Three non-bleeding cratered gastric ulcers with a clean ulcer base       (Forrest Class III) were found in the gastric antrum. The largest lesion       was 5 mm in largest dimension. Biopsies were taken with a cold forceps       for Helicobacter pylori testing.      There was no evidence of significant pathology in the entire examined       duodenum. Scope could not be advanced to jejunum due to significant       looping. Impression:               - Esophageal ulcer with no stigmata of recent                            bleeding.                           - 9 cm hiatal hernia.                           - Erosive gastropathy with no stigmata of recent  bleeding.                           - Non-bleeding gastric ulcers with a clean ulcer                            base (Forrest Class III). Biopsied.                           - Normal examined duodenum. Moderate Sedation:      Per Anesthesia Care Recommendation:           - Return patient to hospital ward for ongoing care.                           - Resume previous diet.                           - Use Prilosec (omeprazole) 40 mg PO BID.                           - Await pathology results.                            - Repeat EGD in 2 months for surveillance.                           - No ibuprofen, naproxen, or other non-steroidal                            anti-inflammatory drugs. Procedure Code(s):        --- Professional ---                           [phone], Small intestinal endoscopy, enteroscopy                            beyond second portion of duodenum, not including                            ileum; with biopsy, single or multiple Diagnosis Code(s):        --- Professional ---                           K22.10, Ulcer of esophagus without bleeding                           K44.9, Diaphragmatic hernia without obstruction or                            gangrene                           K31.89, Other diseases of stomach and duodenum                           K25.9, Gastric ulcer, unspecified as acute or  chronic, without hemorrhage or perforation                           D50.0, Iron deficiency anemia secondary to blood                            loss (chronic) CPT copyright 2022 American [employer]. All rights reserved. The codes documented in this report are preliminary and upon coder review may  be revised to meet current compliance requirements. Katrinka Blazing, MD Katrinka Blazing,  04/01/2023 11:56:57 AM This report has been signed electronically. Number of Addenda: 0

## 2023-04-01 NOTE — Anesthesia Preprocedure Evaluation (Signed)
Anesthesia Evaluation  Patient identified by MRN, date of birth, ID band Patient awake    Reviewed: Allergy & Precautions, H&P , NPO status , Patient's Chart, lab work & pertinent test results, reviewed documented beta blocker date and time   Airway Mallampati: II  TM Distance: >3 FB Neck ROM: full    Dental no notable dental hx.    Pulmonary neg pulmonary ROS   Pulmonary exam normal breath sounds clear to auscultation       Cardiovascular Exercise Tolerance: Good negative cardio ROS  Rhythm:regular Rate:Normal     Neuro/Psych negative neurological ROS  negative psych ROS   GI/Hepatic negative GI ROS, Neg liver ROS,GERD  ,,  Endo/Other  negative endocrine ROS    Renal/GU negative Renal ROS  negative genitourinary   Musculoskeletal   Abdominal   Peds  Hematology negative hematology ROS (+) Blood dyscrasia, anemia   Anesthesia Other Findings   Reproductive/Obstetrics negative OB ROS                             Anesthesia Physical Anesthesia Plan  ASA: 4 and emergent  Anesthesia Plan: General   Post-op Pain Management:    Induction:   PONV Risk Score and Plan: Propofol infusion  Airway Management Planned:   Additional Equipment:   Intra-op Plan:   Post-operative Plan:   Informed Consent: I have reviewed the patients History and Physical, chart, labs and discussed the procedure including the risks, benefits and alternatives for the proposed anesthesia with the patient or authorized representative who has indicated his/her understanding and acceptance.     Dental Advisory Given  Plan Discussed with: CRNA  Anesthesia Plan Comments:        Anesthesia Quick Evaluation

## 2023-04-01 NOTE — Progress Notes (Signed)
Progress Note   Patient: Valerie Griffin WUJ:[id number] DOB: 04/28/1934 DOA: 03/31/2023     0 DOS: the patient was seen and examined on 04/01/2023   Brief hospital admission course: As per H&P written by Dr. Randol Kern on 03/31/2023 Ryn Ogrady  is a 89 y.o. female, PMH including dysphagia, GERD, iron deficiency anemia, with recent hospitalization of May 2024, secondary to profound symptomatic anemia, required multiple units transfusion, seen and evaluated by GI ,  EGD was unrevealing and so capsule was placed.  Underwent colonoscopy which was unrevealing.  GI recommended outpatient hematology evaluation for IV iron transfusion, she received first transfusion, but then she failed to follow-up after that.  Patient had a follow-up with her PCP, has been complaining of worsening and progressive dyspnea, initially with exertion, currently even at rest, feels that this is prior to her symptomatic anemia prior to previous admission and transfusions, denies any black stool, tarry stool, melena, nausea, vomiting, emesis, denies chest pain as well.  Patient reports she is compliant with her PPI for GERD, reports she is current with p.o. iron, patient had a follow-up with PCP who recommended her to come to the hospital for anemia evaluation -ED her workup significant for creatinine of 1.36, around her baseline, her hemoglobin was low at 4.6, platelet at 303K, blood pressure was stable at 142/58, she was Hemoccult negative in ED, she was typed and crossed, 3 units PRBC were ordered, and [facility] requested to admit  Assessment and Plan: 1-symptomatic anemia -acute on chronic (as patient with anemia of chronic disease at baseline from renal failure) -no overt bleeding appreciated or reported -FOBT neg -2 units PRBC's transfused and Hgb up to 7.3 -will transfuse 1 more unit and continue to follow trend -GI service consulted and will follow rec's -continue PPI -continue SCD's for DVT prophylaxis  2-CKD stage  3a -stable and essentially at baseline -will continue minimizing nephrotoxic agents and follow renal function trend -IV iron and epogen as per hematology/nephrology service discretion.  3-history of asthma -Stable and compensated -Continue as needed bronchodilator  4-history of neuropathy -Continue treatment with Neurontin.   Subjective:  Afebrile, no chest pain, no nausea, no vomiting, no overt bleeding and denying palpitations.  Patient reports feeling generally weak.  Physical Exam: Vitals:   04/01/23 1147 04/01/23 1200 04/01/23 1804 04/01/23 1810  BP: 138/62 (!) 136/58 (!) 174/55   Pulse: 88 86 83 84  Resp: (!) 21 (!) 24  19  Temp: 98.2 F (36.8 C)     TempSrc:      SpO2: 96% 96% 97% 99%  Weight:      Height:       General exam: Alert, awake, oriented x 3; pale in appearance and expressing feeling generally weak.  No chest pain, no nausea, no vomiting.  No overt bleeding reported. Respiratory system: Clear to auscultation. Respiratory effort normal.  Good saturation on room air. Cardiovascular system:RRR. No rubs or gallops; no JVD. Gastrointestinal system: Abdomen is obese, nondistended, soft and nontender. No organomegaly or masses felt. Normal bowel sounds heard. Central nervous system: No focal neurological deficits. Extremities: No cyanosis or clubbing. Skin: No rashes, no petechiae. Psychiatry: Judgement and insight appear normal. Mood & affect appropriate.   Data Reviewed: CBC: WBCs 8.0, hemoglobin 7.3 and platelet count 251K Basic metabolic panel: Sodium 140, potassium 3.4, chloride 109, bicarb 24, BUN 21, creatinine 1.36 and GFR 41  Family Communication: Son at bedside  Disposition: Status is: Observation The patient remains OBS appropriate and will d/c  before 2 midnights.   Planned Discharge Destination: Home   Time spent: 40 minutes  Author: Vassie Loll, MD 04/01/2023 6:52 PM  For on call review [email or web address].

## 2023-04-01 NOTE — Progress Notes (Signed)
   04/01/23 1626  TOC Brief Assessment  Insurance and Status Reviewed  Patient has primary care physician Yes  Home environment has been reviewed From home  Prior level of function: Independent  Prior/Current Home Services No current home services  Social Determinants of Health Reivew SDOH reviewed no interventions necessary  Transition of care needs transition of care needs identified, TOC will continue to follow (Pending PT Eval)   Pt has a pending PT evaluation, CSW will check back on 11/10 for potential needs.  TOC to follow.

## 2023-04-01 NOTE — Consult Note (Signed)
Katrinka Blazing, M.D. Gastroenterology & Hepatology                                           Patient Name: BELLARAE RELEFORD Account #: @FLAACCTNO @   MRN: [id number] Admission Date: 03/31/2023 Date of Evaluation:  04/01/2023 Time of Evaluation: 9:52 AM   Referring Physician: Vassie Loll, MD  Chief Complaint: severe anemia  HPI:  This is a 89 y.o. female with history of severe iron deficiency anemia, history of esophageal stenosis, arthritis, who came to the hospital after presenting worsening shortness of breath.  Gastroenterology was consulted for evaluation of recurrent severe iron deficiency anemia.  Patient reports that for the last 2 weeks she felt worsening shortness of breath with exertion and felt very fatigued.  Denies any chest pain.  No presence of melena or hematochezia.  No nausea, vomiting, fever or chills.  Has been taking oral iron and using pantoprazole daily as outpatient.  Last time she was seen by hematology on 10/18/2022, she was presenting significant anemia and was receiving IV iron supplementation.  She is not taking any NSAIDs or anticoagulants.  In the ED, she was HD stable and afebrile. Labs were remarkable for hemoglobin 4.6, MCV 80, WBC 4.5, platelets 303, CMP with BUN 23 and creatinine 1.36, iron was low at 10 and saturation was 2%.  Patient was transfused 3 units of PRBC with adequate response with repeat hemoglobin today of 7.3.  Patient has had the following investigations for her iron deficiency anemia. Last Colonoscopy:07/23/22 - Capsule endoscopy in the cecum. - Three 4 to 10 mm polyps in the transverse colon and in the ascending colon, removed with a cold snare. Resected and retrieved. - One 1 mm polyp in the ascending colon, removed with a cold biopsy forceps. Resected and retrieved. - Two 4 to 6 mm polyps in the descending colon, removed with a cold snare. Resected and retrieved. - Diverticulosis in the sigmoid colon.  - The distal rectum and anal verge  are normal on retroflexion view. 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 No dilation required.  -Large hiatal hernia. Otherwise, normal stomach.  - Normal duodenal bulb and second portion of the  duodenum. No bleeding lesion found in the upper GI  tract.  -Given capsule deployed into the duodenum.  Past Medical History: SEE CHRONIC ISSSUES: Past Medical History:  Diagnosis Date   Anemia    Arthritis    Constipation    Dysphagia 08/16/2016   Esophageal stenosis    GERD (gastroesophageal reflux disease)    Past Surgical History:  Past Surgical History:  Procedure Laterality Date   ABDOMINAL HYSTERECTOMY     APPENDECTOMY     BALLOON DILATION N/A 06/11/2020   Procedure: BALLOON DILATION;  Surgeon: Malissa Hippo, MD;  [facility];  Service: Endoscopy;  Laterality: N/A;   broken arm and wrist with plate and scews rt wrist Right    CHOLECYSTECTOMY     COLONOSCOPY     COLONOSCOPY WITH PROPOFOL N/A 07/23/2022   Procedure: COLONOSCOPY WITH PROPOFOL;  Surgeon: Dolores Frame, MD;  [facility];  Service: Gastroenterology;  Laterality: N/A;   complete hysterectomy'     fibroid tumors bleeding   ESOPHAGEAL DILATION N/A 08/22/2016   Procedure: ESOPHAGEAL DILATION;  Surgeon:  Malissa Hippo, MD;  [facility];  Service: Endoscopy;  Laterality: N/A;   ESOPHAGEAL DILATION N/A 09/14/2016   Procedure: ESOPHAGEAL DILATION;  Surgeon: Malissa Hippo, MD;  [facility];  Service: Endoscopy;  Laterality: N/A;   ESOPHAGEAL DILATION N/A 07/26/2017   Procedure: ESOPHAGEAL DILATION;  Surgeon: Malissa Hippo, MD;  [facility];  Service: Endoscopy;  Laterality: N/A;   ESOPHAGEAL DILATION N/A 08/31/2017   Procedure: ESOPHAGEAL DILATION;  Surgeon: Malissa Hippo, MD;  [facility];   Service: Endoscopy;  Laterality: N/A;   ESOPHAGEAL DILATION N/A 10/26/2017   Procedure: ESOPHAGEAL DILATION;  Surgeon: Malissa Hippo, MD;  [facility];  Service: Endoscopy;  Laterality: N/A;   ESOPHAGEAL DILATION N/A 02/07/2018   Procedure: ESOPHAGEAL DILATION;  Surgeon: Malissa Hippo, MD;  [facility];  Service: Endoscopy;  Laterality: N/A;   ESOPHAGEAL DILATION N/A 05/31/2019   Procedure: ESOPHAGEAL DILATION;  Surgeon: Malissa Hippo, MD;  [facility];  Service: Endoscopy;  Laterality: N/A;   ESOPHAGEAL DILATION N/A 06/14/2019   Procedure: ESOPHAGEAL DILATION;  Surgeon: Malissa Hippo, MD;  [facility];  Service: Endoscopy;  Laterality: N/A;   ESOPHAGEAL DILATION N/A 07/04/2019   Procedure: ESOPHAGEAL DILATION;  Surgeon: Malissa Hippo, MD;  [facility];  Service: Endoscopy;  Laterality: N/A;  730   ESOPHAGEAL DILATION N/A 08/01/2019   Procedure: ESOPHAGEAL DILATION;  Surgeon: Malissa Hippo, MD;  [facility];  Service: Endoscopy;  Laterality: N/A;   ESOPHAGEAL DILATION N/A 05/14/2020   Procedure: ESOPHAGEAL DILATION;  Surgeon: Malissa Hippo, MD;  [facility];  Service: Endoscopy;  Laterality: N/A;   ESOPHAGOGASTRODUODENOSCOPY N/A 08/22/2016   Procedure: ESOPHAGOGASTRODUODENOSCOPY (EGD);  Surgeon: Malissa Hippo, MD;  [facility];  Service: Endoscopy;  Laterality: N/A;   ESOPHAGOGASTRODUODENOSCOPY N/A 09/14/2016   Procedure: ESOPHAGOGASTRODUODENOSCOPY (EGD);  Surgeon: Malissa Hippo, MD;  [facility];  Service: Endoscopy;  Laterality: N/A;  1225   ESOPHAGOGASTRODUODENOSCOPY N/A 07/26/2017   Procedure: ESOPHAGOGASTRODUODENOSCOPY (EGD);  Surgeon: Malissa Hippo, MD;  [facility];  Service: Endoscopy;  Laterality: N/A;  1:45   ESOPHAGOGASTRODUODENOSCOPY N/A 08/31/2017   Procedure: ESOPHAGOGASTRODUODENOSCOPY (EGD);  Surgeon: Malissa Hippo, MD;  [facility];   Service: Endoscopy;  Laterality: N/A;   ESOPHAGOGASTRODUODENOSCOPY N/A 10/26/2017   Procedure: ESOPHAGOGASTRODUODENOSCOPY (EGD);  Surgeon: Malissa Hippo, MD;  [facility];  Service: Endoscopy;  Laterality: N/A;  1115   ESOPHAGOGASTRODUODENOSCOPY N/A 02/07/2018   Procedure: ESOPHAGOGASTRODUODENOSCOPY (EGD);  Surgeon: Malissa Hippo, MD;  [facility];  Service: Endoscopy;  Laterality: N/A;  830   ESOPHAGOGASTRODUODENOSCOPY N/A 07/04/2019   Procedure: ESOPHAGOGASTRODUODENOSCOPY (EGD);  Surgeon: Malissa Hippo, MD;  [facility];  Service: Endoscopy;  Laterality: N/A;  730   ESOPHAGOGASTRODUODENOSCOPY N/A 08/01/2019   Procedure: ESOPHAGOGASTRODUODENOSCOPY (EGD);  Surgeon: Malissa Hippo, MD;  [facility];  Service: Endoscopy;  Laterality: N/A;  125   ESOPHAGOGASTRODUODENOSCOPY N/A 05/14/2020   Procedure: ESOPHAGOGASTRODUODENOSCOPY (EGD);  Surgeon: Malissa Hippo, MD;  [facility];  Service: Endoscopy;  Laterality: N/A;  10:30   ESOPHAGOGASTRODUODENOSCOPY N/A 06/11/2020   Procedure: ESOPHAGOGASTRODUODENOSCOPY (EGD);  Surgeon: Malissa Hippo, MD;  [facility];  Service: Endoscopy;  Laterality: N/A;  7:30   ESOPHAGOGASTRODUODENOSCOPY (EGD) WITH PROPOFOL N/A 05/31/2019   Procedure: ESOPHAGOGASTRODUODENOSCOPY (EGD) WITH PROPOFOL;  Surgeon: Malissa Hippo, MD;  [facility];  Service: Endoscopy;  Laterality: N/A;  2:35   ESOPHAGOGASTRODUODENOSCOPY (EGD) WITH PROPOFOL N/A 06/14/2019   Procedure: ESOPHAGOGASTRODUODENOSCOPY (EGD) WITH PROPOFOL;  Surgeon: Malissa Hippo, MD;  [facility];  Service: Endoscopy;  Laterality: N/A;   ESOPHAGOGASTRODUODENOSCOPY (EGD) WITH PROPOFOL N/A 07/21/2022   Procedure: ESOPHAGOGASTRODUODENOSCOPY (EGD) WITH PROPOFOL;  Surgeon: Corbin Ade, MD;  [facility];  Service: Endoscopy;  Laterality: N/A;   FOREIGN BODY REMOVAL  05/31/2019   Procedure: FOREIGN BODY REMOVAL;   Surgeon: Malissa Hippo, MD;  [facility];  Service: Endoscopy;;   GIVENS CAPSULE STUDY N/A 07/21/2022   Procedure: GIVENS CAPSULE STUDY;  Surgeon: Corbin Ade, MD;  [facility];  Service: Endoscopy;  Laterality: N/A;   POLYPECTOMY  07/23/2022   Procedure: POLYPECTOMY;  Surgeon: Dolores Frame, MD;  [facility];  Service: Gastroenterology;;   Family History:  Family History  Problem Relation Age of Onset   Heart Problems Mother    Alzheimer's disease Mother    Hypertension Mother    Prostate cancer Father    Heart attack Father    Heart Problems Sister    Social History:  Social History   Tobacco Use   Smoking status: Never    Passive exposure: Current   Smokeless tobacco: Never  Vaping Use   Vaping status: Never Used  Substance Use Topics   Alcohol use: No   Drug use: No    Home Medications:  Prior to Admission medications   Medication Sig Start Date End Date Taking? Authorizing Provider  Ascorbic Acid (VITAMIN C) 1000 MG tablet Take 1,000 mg by mouth daily.   Yes [provider]  Cholecalciferol (VITAMIN D) 50 MCG (2000 UT) tablet Take 2,000 Units by mouth daily.   Yes [provider]  docusate sodium (COLACE) 100 MG capsule Take 100 mg by mouth at bedtime.   Yes [provider]  esomeprazole (NEXIUM) 20 MG capsule Take 1 capsule (20 mg total) by mouth 2 (two) times daily before a meal. Patient taking differently: Take 20 mg by mouth daily before breakfast. Takes once daily. Gets OTC 02/07/18  Yes Rehman, Joline Maxcy, MD  gabapentin (NEURONTIN) 300 MG capsule Take 300 mg by mouth 3 (three) times daily as needed (pain). 2 capsules at bedtime and 2 more during the day as needed 10/19/21  Yes [provider]  ibuprofen (ADVIL) 100 MG tablet Take 200 mg by mouth every 6 (six) hours as needed for fever or pain.   Yes [provider]  Iron, Ferrous Sulfate, 325 (65 Fe) MG TABS Take 325 mg by  mouth in the morning and at bedtime. 07/23/22  Yes Standley Brooking, MD  Multiple Vitamin (MULTIVITAMIN WITH MINERALS) TABS tablet Take 1 tablet by mouth daily. Centrum Silver   Yes [provider]  naproxen sodium (ALEVE) 220 MG tablet Take 220 mg by mouth daily as needed (pain).   Yes [provider]    Inpatient Medications:  Current Facility-Administered Medications:    0.9 %  sodium chloride infusion (Manually program via Guardrails IV Fluids), , Intravenous, Once, Elgergawy, Leana Roe, MD   0.9 %  sodium chloride infusion (Manually program via Guardrails IV Fluids), , Intravenous, Once, Vassie Loll, MD   acetaminophen (TYLENOL) tablet 650 mg, 650 mg, Oral, Q6H PRN, 650 mg at 03/31/23 2334 **OR** acetaminophen (TYLENOL) suppository 650 mg, 650 mg, Rectal, Q6H  PRN, Elgergawy, Leana Roe, MD   albuterol (PROVENTIL) (2.5 MG/3ML) 0.083% nebulizer solution 2.5 mg, 2.5 mg, Nebulization, Q2H PRN, Elgergawy, Leana Roe, MD   Chlorhexidine Gluconate Cloth 2 % PADS 6 each, 6 each, Topical, Q0600, Elgergawy, Leana Roe, MD, 6 each at 03/31/23 2313   furosemide (LASIX) injection 20 mg, 20 mg, Intravenous, Once, Elgergawy, Leana Roe, MD   hydrALAZINE (APRESOLINE) injection 5 mg, 5 mg, Intravenous, Q4H PRN, Elgergawy, Leana Roe, MD   influenza vaccine adjuvanted (FLUAD) injection 0.5 mL, 0.5 mL, Intramuscular, Tomorrow-1000, Elgergawy, Leana Roe, MD   ondansetron (ZOFRAN) tablet 4 mg, 4 mg, Oral, Q6H PRN **OR** ondansetron (ZOFRAN) injection 4 mg, 4 mg, Intravenous, Q6H PRN, Elgergawy, Leana Roe, MD   pantoprazole (PROTONIX) injection 40 mg, 40 mg, Intravenous, Q24H, Elgergawy, Leana Roe, MD, 40 mg at 03/31/23 1806   pneumococcal 20-valent conjugate vaccine (PREVNAR 20) injection 0.5 mL, 0.5 mL, Intramuscular, Tomorrow-1000, Elgergawy, Leana Roe, MD Allergies: Penicillins  Complete Review of Systems: GENERAL: negative for malaise, night sweats HEENT: No changes in hearing or vision, no nose  bleeds or other nasal problems. NECK: Negative for lumps, goiter, pain and significant neck swelling RESPIRATORY: Negative for cough, wheezing CARDIOVASCULAR: Negative for chest pain, leg swelling, palpitations, orthopnea GI: SEE HPI MUSCULOSKELETAL: Negative for joint pain or swelling, back pain, and muscle pain. SKIN: Negative for lesions, rash PSYCH: Negative for sleep disturbance, mood disorder and recent psychosocial stressors. HEMATOLOGY Negative for prolonged bleeding, bruising easily, and swollen nodes. ENDOCRINE: Negative for cold or heat intolerance, polyuria, polydipsia and goiter. NEURO: negative for tremor, gait imbalance, syncope and seizures. The remainder of the review of systems is noncontributory.  Physical Exam: BP (!) 131/47   Pulse 79   Temp 97.8 F (36.6 C) (Oral)   Resp 19   Ht 5\' 5"  (1.651 m)   Wt 84.8 kg   SpO2 99%   BMI 31.11 kg/m  GENERAL: The patient is AO x3, in no acute distress. Looks frail. HEENT: Head is normocephalic and atraumatic. EOMI are intact. Mouth is well hydrated and without lesions. NECK: Supple. No masses LUNGS: Clear to auscultation. No presence of rhonchi/wheezing/rales. Adequate chest expansion HEART: RRR, normal s1 and s2. ABDOMEN: Soft, nontender, no guarding, no peritoneal signs, and nondistended. BS +. No masses. EXTREMITIES: Without any cyanosis, clubbing, rash, lesions or edema. NEUROLOGIC: AOx3, no focal motor deficit. SKIN: no jaundice, no rashes  Laboratory Data CBC:     Component Value Date/Time   WBC 8.0 04/01/2023 0517   RBC 3.23 (L) 04/01/2023 0517   HGB 7.3 (L) 04/01/2023 0517   HCT 25.3 (L) 04/01/2023 0517   PLT 251 04/01/2023 0517   MCV 78.3 (L) 04/01/2023 0517   MCH 22.6 (L) 04/01/2023 0517   MCHC 28.9 (L) 04/01/2023 0517   RDW 19.5 (H) 04/01/2023 0517   LYMPHSABS 1.0 03/31/2023 1238   MONOABS 0.3 03/31/2023 1238   EOSABS 0.1 03/31/2023 1238   BASOSABS 0.0 03/31/2023 1238   COAG: No results found for:  "INR", "PROTIME"  BMP:     Latest Ref Rng & Units 04/01/2023    5:17 AM 03/31/2023   12:38 PM 10/14/2022   10:49 AM  BMP  Glucose 70 - 99 mg/dL 89  98  098   BUN 8 - 23 mg/dL 21  23  20    Creatinine 0.44 - 1.00 mg/dL 1.19  1.47  8.29   Sodium 135 - 145 mmol/L 140  140  137   Potassium 3.5 - 5.1 mmol/L  3.4  4.2  3.9   Chloride 98 - 111 mmol/L 109  108  104   CO2 22 - 32 mmol/L 24  22  26    Calcium 8.9 - 10.3 mg/dL 8.5  9.2  8.4     HEPATIC:     Latest Ref Rng & Units 03/31/2023   12:38 PM 10/14/2022   10:49 AM 05/09/2019    3:13 PM  Hepatic Function  Total Protein 6.5 - 8.1 g/dL 6.4  6.6  6.8   Albumin 3.5 - 5.0 g/dL 3.6  3.4    AST 15 - 41 U/L 20  21  19    ALT 0 - 44 U/L 15  14  9    Alk Phosphatase 38 - 126 U/L 51  60    Total Bilirubin <1.2 mg/dL 0.3  0.5  0.5     CARDIAC: No results found for: "CKTOTAL", "CKMB", "CKMBINDEX", "TROPONINI"   Imaging: I personally reviewed and interpreted the available imaging.  Assessment & Plan: LOTTIE OHLIN is a 89 y.o. female with history of severe iron deficiency anemia, history of esophageal stenosis, arthritis, who came to the hospital after presenting worsening shortness of breath.  Gastroenterology was consulted for evaluation of recurrent severe iron deficiency anemia.  The patient has presented recurrent severe anemia.  She has had extensive investigations for this early this year, without identification of source of gastrointestinal losses.  She has very profoundly depleted iron stores.  Due to this, I discussed with the patient proceeding with a repeat investigation of her upper gastrointestinal tract with an enteroscopy, which she is agreeable to proceed with.  Will need to keep frequent check of her H&H daily.  She will benefit from IV iron infusion during the current admission.  - Repeat CBC qday, transfuse if Hb <7 - Pantoprazole 40 mg qday PO - 2 large bore IV lines - Active T/S - Keep NPO - Avoid NSAIDs - Will proceed  with enteroscopy. -Patient will benefit from receiving IV iron during current admission.  Katrinka Blazing, MD Gastroenterology and Hepatology [facility]

## 2023-04-01 NOTE — Brief Op Note (Signed)
03/31/2023 - 04/01/2023  11:53 AM  PATIENT:  Valerie Griffin  89 y.o. female  PRE-OPERATIVE DIAGNOSIS:  severe iron deficiency anemia  POST-OPERATIVE DIAGNOSIS:  GE junction ulcer; gastritis; erosions; gastric ulcersx3;large, 8cm(29-37)hiatal, hernia;  PROCEDURE:  Procedure(s): ENTEROSCOPY (N/A) BIOPSY  SURGEON:  Surgeons and Role:    * Dolores Frame, MD - Primary  Patient underwent enteroscopy under propofol sedation.  Tolerated the procedure adequately.   FINDINGS: - Esophageal ulcer with no stigmata of recent bleeding.  - 9 cm hiatal hernia.  - Erosive gastropathy with no stigmata of recent bleeding.  - Non-bleeding gastric ulcers with a clean ulcer base (Forrest Class III).  Biopsied.  - Normal examined duodenum. Scope could not be advanced to jejunum due to significant looping.  RECOMMENDATIONS. - Return patient to hospital ward for ongoing care.  - Resume previous diet.  - Use Prilosec (omeprazole) 40 mg PO BID.  - Await pathology results.  - Repeat EGD in 2 months for surveillance.  - No ibuprofen, naproxen, or other non-steroidal anti-inflammatory drugs.    Katrinka Blazing, MD Gastroenterology and Hepatology [facility]

## 2023-04-02 ENCOUNTER — Telehealth (INDEPENDENT_AMBULATORY_CARE_PROVIDER_SITE_OTHER): Payer: Self-pay | Admitting: Gastroenterology

## 2023-04-02 DIAGNOSIS — D508 Other iron deficiency anemias: Secondary | ICD-10-CM

## 2023-04-02 DIAGNOSIS — K253 Acute gastric ulcer without hemorrhage or perforation: Secondary | ICD-10-CM

## 2023-04-02 DIAGNOSIS — N1832 Chronic kidney disease, stage 3b: Secondary | ICD-10-CM | POA: Diagnosis not present

## 2023-04-02 DIAGNOSIS — K449 Diaphragmatic hernia without obstruction or gangrene: Secondary | ICD-10-CM | POA: Diagnosis not present

## 2023-04-02 DIAGNOSIS — K259 Gastric ulcer, unspecified as acute or chronic, without hemorrhage or perforation: Secondary | ICD-10-CM | POA: Diagnosis not present

## 2023-04-02 DIAGNOSIS — D509 Iron deficiency anemia, unspecified: Secondary | ICD-10-CM | POA: Diagnosis not present

## 2023-04-02 DIAGNOSIS — K2210 Ulcer of esophagus without bleeding: Secondary | ICD-10-CM | POA: Diagnosis not present

## 2023-04-02 DIAGNOSIS — B9681 Helicobacter pylori [H. pylori] as the cause of diseases classified elsewhere: Secondary | ICD-10-CM

## 2023-04-02 DIAGNOSIS — D649 Anemia, unspecified: Secondary | ICD-10-CM | POA: Diagnosis not present

## 2023-04-02 DIAGNOSIS — K2100 Gastro-esophageal reflux disease with esophagitis, without bleeding: Secondary | ICD-10-CM | POA: Diagnosis not present

## 2023-04-02 LAB — CBC
HCT: 27.9 % — ABNORMAL LOW (ref 36.0–46.0)
Hemoglobin: 8.4 g/dL — ABNORMAL LOW (ref 12.0–15.0)
MCH: 24.3 pg — ABNORMAL LOW (ref 26.0–34.0)
MCHC: 30.1 g/dL (ref 30.0–36.0)
MCV: 80.6 fL (ref 80.0–100.0)
Platelets: 206 10*3/uL (ref 150–400)
RBC: 3.46 MIL/uL — ABNORMAL LOW (ref 3.87–5.11)
RDW: 19.8 % — ABNORMAL HIGH (ref 11.5–15.5)
WBC: 4.7 10*3/uL (ref 4.0–10.5)
nRBC: 0 % (ref 0.0–0.2)

## 2023-04-02 MED ORDER — PANTOPRAZOLE SODIUM 40 MG PO TBEC: 40.0000 mg | DELAYED_RELEASE_TABLET | Freq: Two times a day (BID) | ORAL | 3 refills | Status: DC

## 2023-04-02 NOTE — TOC Progression Note (Signed)
Transition of Care [facility]) - Progression Note    Patient Details  Name: Valerie Griffin MRN: [id number] Date of Birth: 13-Jun-1933  Transition of [facility]) CM/SW Contact  Catalina Gravel, LCSW Phone Number: 04/02/2023, 8:34 AM  Clinical Narrative:    CSW met with pt at bedside to deliver Obs. Left copy with her other papers at her request.  Pt was alert and understood, mentioned that she has a headache -she said she had not  let nurse know because they are busy, but she will. .As CSW exited room nurse overheard and was going to treat pt. TOC to follow.        Barriers to Discharge: Continued Medical Work up  Expected Discharge Plan and Services                                               Social Determinants of Health (SDOH) Interventions SDOH Screenings   Food Insecurity: No Food Insecurity (03/31/2023)  Housing: Low Risk  (03/31/2023)  Transportation Needs: No Transportation Needs (03/31/2023)  Utilities: Not At Risk (03/31/2023)  Depression (PHQ2-9): Low Risk  (08/05/2022)  Financial Resource Strain: Medium Risk (08/10/2020)   Received from [facility]  Tobacco Use: Medium Risk (03/31/2023)    Readmission Risk Interventions     No data to display

## 2023-04-02 NOTE — Care Management Obs Status (Signed)
MEDICARE OBSERVATION STATUS NOTIFICATION   Patient Details  Name: Valerie Griffin MRN: [id number] Date of Birth: 28-Apr-1934   Medicare Observation Status Notification Given:  Yes    Jahniyah Revere Marsh Dolly, LCSW 04/02/2023, 8:34 AM

## 2023-04-02 NOTE — Evaluation (Signed)
Physical Therapy Evaluation Patient Details Name: Valerie Griffin MRN: [id number] DOB: 10-Jun-1933 Today's Date: 04/02/2023  History of Present Illness  Rashanti Kujath  is a 89 y.o. female, PMH including dysphagia, GERD, iron deficiency anemia, with recent hospitalization of May 2024, secondary to profound symptomatic anemia, required multiple units transfusion, seen and evaluated by GI ,  EGD was unrevealing and so capsule was placed.  Underwent colonoscopy which was unrevealing.  GI recommended outpatient hematology evaluation for IV iron transfusion, she received first transfusion, but then she failed to follow-up after that.  Findings of her GI workup during hospitalization of May 2024.  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 Evidence of duodenitis and patchy erythema throughout the small bowel. Mild oozing likely secondary to scope for EGD (this area is likely at the area of peptic stricture). No obvious upper or mid bowel source of melena.  Colonoscopy.  Polyps removed.     Patient had a follow-up with her PCP, has been complaining of worsening and progressive dyspnea, initially with exertion, currently even at rest, feels that this is prior to her symptomatic anemia prior to previous admission and transfusions, denies any black stool, tarry stool, melena, nausea, vomiting, emesis, denies chest pain as well.  Patient reports she is compliant with her PPI for GERD, reports she is current with p.o. iron, patient had a follow-up with PCP who recommended her to come to the hospital for anemia evaluation   Clinical Impression  Patient demonstrates slightly labored movement for transfers and walking in room/hallway without loss of balance.  Patient able to ambulate in hallway and tolerated sitting up in chair after therapy.  PLAN:  Patient to be  discharged home today and discharged from acute physical therapy to care of nursing for ambulation as tolerated for length of stay with recommendations stated below         If plan is discharge home, recommend the following: A little help with walking and/or transfers;A little help with bathing/dressing/bathroom;Help with stairs or ramp for entrance;Assistance with cooking/housework   Can travel by private vehicle        Equipment Recommendations None recommended by PT  Recommendations for Other Services       Functional Status Assessment Patient has had a recent decline in their functional status and demonstrates the ability to make significant improvements in function in a reasonable and predictable amount of time.     Precautions / Restrictions Precautions Precautions: Fall Restrictions Weight Bearing Restrictions: No      Mobility  Bed Mobility Overal bed mobility: Needs Assistance Bed Mobility: Supine to Sit     Supine to sit: Supervision     General bed mobility comments: increased time, labored movement    Transfers Overall transfer level: Needs assistance Equipment used: Rolling walker (2 wheels) Transfers: Sit to/from Stand, Bed to chair/wheelchair/BSC Sit to Stand: Supervision   Step pivot transfers: Supervision, Contact guard assist       General transfer comment: increased time, labored movement with mild difficulty transferring to/from commode    Ambulation/Gait Ambulation/Gait assistance: Supervision Gait Distance (Feet): 40 Feet Assistive device: Rolling walker (2 wheels) Gait Pattern/deviations: Decreased step length - right, Decreased step length - left, Decreased stride length, Trunk flexed Gait velocity: decreased     General Gait Details: slow slightly labored cadence without loss of balance, limited mostly due to fatigue  Stairs  Wheelchair Mobility     Tilt Bed    Modified Rankin (Stroke Patients Only)        Balance Overall balance assessment: Needs assistance Sitting-balance support: Feet supported, No upper extremity supported Sitting balance-Leahy Scale: Good Sitting balance - Comments: seated at EOB   Standing balance support: During functional activity, No upper extremity supported Standing balance-Leahy Scale: Poor Standing balance comment: fair/good using RW                             Pertinent Vitals/Pain Pain Assessment Pain Assessment: 0-10 Pain Score: 2  Pain Location: frontal headache Pain Descriptors / Indicators: Headache Pain Intervention(s): Limited activity within patient's tolerance, Monitored during session, Premedicated before session    Home Living Family/patient expects to be discharged to:: Private residence Living Arrangements: Children Available Help at Discharge: Family;Available PRN/intermittently Type of Home: House Home Access: Stairs to enter Entrance Stairs-Rails: None Entrance Stairs-Number of Steps: 2   Home Layout: One level;Laundry or work area in [employer] Equipment: Agricultural consultant (2 wheels);Rollator (4 wheels)      Prior Function Prior Level of Function : Independent/Modified Independent;Driving             Mobility Comments: household and short distanced  community ambulator using RW, occasionally drives ADLs Comments: Indpendent for household ADLs, assisted for community by family     Extremity/Trunk Assessment   Upper Extremity Assessment Upper Extremity Assessment: Overall WFL for tasks assessed    Lower Extremity Assessment Lower Extremity Assessment: Generalized weakness    Cervical / Trunk Assessment Cervical / Trunk Assessment: Normal  Communication   Communication Communication: No apparent difficulties  Cognition Arousal: Alert Behavior During Therapy: WFL for tasks assessed/performed Overall Cognitive Status: Within Functional Limits for tasks assessed                                           General Comments      Exercises     Assessment/Plan    PT Assessment All further PT needs can be met in the next venue of care  PT Problem List Decreased strength;Decreased activity tolerance;Decreased balance;Decreased mobility       PT Treatment Interventions      PT Goals (Current goals can be found in the Care Plan section)  Acute Rehab PT Goals Patient Stated Goal: return home with family to assist PT Goal Formulation: With patient Time For Goal Achievement: 04/02/23 Potential to Achieve Goals: Good    Frequency       Co-evaluation               AM-PAC PT "6 Clicks" Mobility  Outcome Measure Help needed turning from your back to your side while in a flat bed without using bedrails?: None Help needed moving from lying on your back to sitting on the side of a flat bed without using bedrails?: A Little Help needed moving to and from a bed to a chair (including a wheelchair)?: A Little Help needed standing up from a chair using your arms (e.g., wheelchair or bedside chair)?: A Little Help needed to walk in hospital room?: A Little Help needed climbing 3-5 steps with a railing? : A Little 6 Click Score: 19    End of Session   Activity Tolerance: Patient tolerated treatment well;Patient limited by fatigue Patient left: in chair;with call  bell/phone within reach Nurse Communication: Mobility status PT Visit Diagnosis: Unsteadiness on feet (R26.81);Other abnormalities of gait and mobility (R26.89);Muscle weakness (generalized) (M62.81)    Time: 9562-1308 PT Time Calculation (min) (ACUTE ONLY): 27 min   Charges:   PT Evaluation $PT Eval Moderate Complexity: 1 Mod PT Treatments $Therapeutic Activity: 23-37 mins PT General Charges $$ ACUTE PT VISIT: 1 Visit         11:00 AM, 04/02/23 Ocie Bob, MPT Physical Therapist with [facility] [phone] office [phone] mobile phone

## 2023-04-02 NOTE — Progress Notes (Signed)
Valerie Griffin, M.D. Gastroenterology & Hepatology   Interval History:  Patient reports that she has felt better, SOB is better compared to prior. No chest pain, nausea, vomiting, fever or chills. Hemoglobin today was 8.4.  Inpatient Medications:  Current Facility-Administered Medications:    0.9 %  sodium chloride infusion (Manually program via Guardrails IV Fluids), , Intravenous, Once, Vassie Loll, MD   acetaminophen (TYLENOL) tablet 650 mg, 650 mg, Oral, Q6H PRN, 650 mg at 04/02/23 0834 **OR** acetaminophen (TYLENOL) suppository 650 mg, 650 mg, Rectal, Q6H PRN, Vassie Loll, MD   albuterol (PROVENTIL) (2.5 MG/3ML) 0.083% nebulizer solution 2.5 mg, 2.5 mg, Nebulization, Q2H PRN, Vassie Loll, MD   Chlorhexidine Gluconate Cloth 2 % PADS 6 each, 6 each, Topical, Q0600, Vassie Loll, MD, 6 each at 04/02/23 0501   furosemide (LASIX) injection 20 mg, 20 mg, Intravenous, Once, Vassie Loll, MD   gabapentin (NEURONTIN) capsule 300 mg, 300 mg, Oral, BID, Vassie Loll, MD, 300 mg at 04/02/23 8119   hydrALAZINE (APRESOLINE) injection 5 mg, 5 mg, Intravenous, Q4H PRN, Vassie Loll, MD   melatonin tablet 6 mg, 6 mg, Oral, QHS, Zierle-Ghosh, Asia B, DO, 6 mg at 04/01/23 2107   ondansetron (ZOFRAN) tablet 4 mg, 4 mg, Oral, Q6H PRN **OR** ondansetron (ZOFRAN) injection 4 mg, 4 mg, Intravenous, Q6H PRN, Vassie Loll, MD   pantoprazole (PROTONIX) injection 40 mg, 40 mg, Intravenous, Q24H, Vassie Loll, MD, 40 mg at 04/01/23 1805   I/O    Intake/Output Summary (Last 24 hours) at 04/02/2023 1026 Last data filed at 04/02/2023 0544 Gross per 24 hour  Intake 694.5 ml  Output 500 ml  Net 194.5 ml     Physical Exam: Temp:  [97.7 F (36.5 C)-98.5 F (36.9 C)] 98.1 F (36.7 C) (11/10 0800) Pulse Rate:  [65-88] 75 (11/10 0900) Resp:  [10-25] 24 (11/10 0900) BP: (115-180)/(34-84) 115/84 (11/10 0900) SpO2:  [85 %-99 %] 99 % (11/10 0900)  Temp (24hrs), Avg:98.1 F (36.7 C),  Min:97.7 F (36.5 C), Max:98.5 F (36.9 C)  GENERAL: The patient is AO x3, in no acute distress. HEENT: Head is normocephalic and atraumatic. EOMI are intact. Mouth is well hydrated and without lesions. NECK: Supple. No masses LUNGS: Clear to auscultation. No presence of rhonchi/wheezing/rales. Adequate chest expansion HEART: RRR, normal s1 and s2. ABDOMEN: Soft, nontender, no guarding, no peritoneal signs, and nondistended. BS +. No masses. EXTREMITIES: Without any cyanosis, clubbing, rash, lesions or edema. NEUROLOGIC: AOx3, no focal motor deficit. SKIN: no jaundice, no rashes  Laboratory Data: CBC:     Component Value Date/Time   WBC 4.7 04/02/2023 0341   RBC 3.46 (L) 04/02/2023 0341   HGB 8.4 (L) 04/02/2023 0341   HCT 27.9 (L) 04/02/2023 0341   PLT 206 04/02/2023 0341   MCV 80.6 04/02/2023 0341   MCH 24.3 (L) 04/02/2023 0341   MCHC 30.1 04/02/2023 0341   RDW 19.8 (H) 04/02/2023 0341   LYMPHSABS 1.0 03/31/2023 1238   MONOABS 0.3 03/31/2023 1238   EOSABS 0.1 03/31/2023 1238   BASOSABS 0.0 03/31/2023 1238   COAG: No results found for: "INR", "PROTIME"  BMP:     Latest Ref Rng & Units 04/01/2023    5:17 AM 03/31/2023   12:38 PM 10/14/2022   10:49 AM  BMP  Glucose 70 - 99 mg/dL 89  98  147   BUN 8 - 23 mg/dL 21  23  20    Creatinine 0.44 - 1.00 mg/dL 8.29  5.62  1.30   Sodium  135 - 145 mmol/L 140  140  137   Potassium 3.5 - 5.1 mmol/L 3.4  4.2  3.9   Chloride 98 - 111 mmol/L 109  108  104   CO2 22 - 32 mmol/L 24  22  26    Calcium 8.9 - 10.3 mg/dL 8.5  9.2  8.4     HEPATIC:     Latest Ref Rng & Units 03/31/2023   12:38 PM 10/14/2022   10:49 AM 05/09/2019    3:13 PM  Hepatic Function  Total Protein 6.5 - 8.1 g/dL 6.4  6.6  6.8   Albumin 3.5 - 5.0 g/dL 3.6  3.4    AST 15 - 41 U/L 20  21  19    ALT 0 - 44 U/L 15  14  9    Alk Phosphatase 38 - 126 U/L 51  60    Total Bilirubin <1.2 mg/dL 0.3  0.5  0.5     CARDIAC: No results found for: "CKTOTAL", "CKMB", "CKMBINDEX",  "TROPONINI"    Imaging: I personally reviewed and interpreted the available labs, imaging and endoscopic files.   Assessment/Plan: JOSEPHINA HATTAR is a 89 y.o. female with history of severe iron deficiency anemia, history of esophageal stenosis, arthritis, who came to the hospital after presenting worsening shortness of breath.  Gastroenterology was consulted for evaluation of recurrent severe iron deficiency anemia.  The patient has presented recurrent severe anemia.  Patient was transfused some units of PRBC with adequate response.  Underwent EGD/enteroscopy yesterday which showed presence of a large hiatal hernia of 9 cm, also presence of an esophageal ulcer and associated esophagitis, and at least 4 small gastric ulcers in the antrum which were biopsied.  Scope could only be advanced to the third portion of the duodenum due to significant looping.  Patient reported taking intermittently Aleve and ibuprofen for aches.  I explained to her the importance of avoiding these medications at all cost.  She will need to continue PPI twice daily upon discharge and will need to have a repeat EGD in 2 months.  Can advance her diet for now as hemoglobin has remained stable.  She will need to follow-up in the GI clinic.  Will also follow pathology reports.   Given her severe iron deficiency, she will benefit from 1 dose of IV iron during the current admission.  -Advance diet as tolerated -Continue Prilosec (omeprazole) 40 mg PO BID.  -For up pathology results.  - Repeat EGD in 2 months for surveillance.  - No ibuprofen, naproxen, or other non-steroidal anti-inflammatory drugs.  -Patient will benefit from receiving IV iron during current admission - Patient will follow up in GI clinic with Dr. Wilburt Finlay clinic in 3-4 weeks. - GI service will sign-off, please call us back if you have any more questions.  Valerie Blazing, MD Gastroenterology and Hepatology [facility]

## 2023-04-02 NOTE — TOC Progression Note (Signed)
Transition of Care [facility]) - Progression Note    Patient Details  Name: Valerie Griffin MRN: [id number] Date of Birth: 22-Jun-1933  Transition of Care [facility]) CM/SW Contact  Catalina Gravel, LCSW Phone Number: 04/02/2023, 11:14 AM  Clinical Narrative:    CSW returned to see pt as PT recommended HHPT- Pt was now sitting in chair feeling better.  Pt declined PT states that she will do the exercises and does not feel this is something that she needs.  No other TOC needs at this time.     Barriers to Discharge: Continued Medical Work up  Expected Discharge Plan and Services         Expected Discharge Date: 04/02/23                                     Social Determinants of Health (SDOH) Interventions SDOH Screenings   Food Insecurity: No Food Insecurity (03/31/2023)  Housing: Low Risk  (03/31/2023)  Transportation Needs: No Transportation Needs (03/31/2023)  Utilities: Not At Risk (03/31/2023)  Depression (PHQ2-9): Low Risk  (08/05/2022)  Financial Resource Strain: Medium Risk (08/10/2020)   Received from [facility]  Tobacco Use: Medium Risk (03/31/2023)    Readmission Risk Interventions     No data to display

## 2023-04-02 NOTE — Telephone Encounter (Signed)
Hi Mitzie,  Can you please schedule a follow up appointment for this patient in 3-4 weeks with me or any of the APPs?  Thanks,  Ryker Pherigo Castaneda, MD Gastroenterology and Hepatology [facility]  

## 2023-04-02 NOTE — Discharge Summary (Signed)
Physician Discharge Summary   Patient: Valerie Griffin MRN: [id number] DOB: 1934-01-17  Admit date:     03/31/2023  Discharge date: 04/02/23  Discharge Physician: Vassie Loll   PCP: Ignatius Specking, MD   Recommendations at discharge:  Repeat basic metabolic panel to follow ultralights renal function Repeat CBC to follow hemoglobin trend/stability.  Discharge Diagnoses: Symptomatic anemia Chronic kidney disease stage IIIb GERD (gastroesophageal reflux disease) IDA (iron deficiency anemia)  Brief hospital admission course: As per H&P written by Dr. Randol Kern on 03/31/2023 Valerie Griffin  is a 89 y.o. female, PMH including dysphagia, GERD, iron deficiency anemia, with recent hospitalization of May 2024, secondary to profound symptomatic anemia, required multiple units transfusion, seen and evaluated by GI ,  EGD was unrevealing and so capsule was placed.  Underwent colonoscopy which was unrevealing.  GI recommended outpatient hematology evaluation for IV iron transfusion, she received first transfusion, but then she failed to follow-up after that.  Patient had a follow-up with her PCP, has been complaining of worsening and progressive dyspnea, initially with exertion, currently even at rest, feels that this is prior to her symptomatic anemia prior to previous admission and transfusions, denies any black stool, tarry stool, melena, nausea, vomiting, emesis, denies chest pain as well.  Patient reports she is compliant with her PPI for GERD, reports she is current with p.o. iron, patient had a follow-up with PCP who recommended her to come to the hospital for anemia evaluation -ED her workup significant for creatinine of 1.36, around her baseline, her hemoglobin was low at 4.6, platelet at 303K, blood pressure was stable at 142/58, she was Hemoccult negative in ED, she was typed and crossed, 3 units PRBC were ordered, and [facility] requested to admit  Assessment and Plan: 1-symptomatic anemia -acute  on chronic (as patient with anemia of chronic disease at baseline from renal failure) -no overt bleeding appreciated or reported -FOBT neg - a total of 3 units PRBCs has been transfused throughout hospitalization and hemoglobin at discharge 8.4. -After shared assistance and recommendation by GI service; endoscopy performed demonstrating gastritis and hiatal hernia. -Patient advised to avoid the use of NSAIDs -Continue PPI twice a day -Continue patient follow-up with PCP, GI service and hematology.   2-CKD stage 3b -Initially thought to be chronic kidney disease stage IIIa; after reviewing chart and following patient GFR patient's with chronic kidney disease stage IIIb. -Overall stable and compensated -Continue to maintain adequate hydration -Follow basic metabolic panel.  Patient to reassess electrolytes and renal function trend/stability. -IV iron and epogen as per hematology/nephrology service discretion.   3-history of asthma -Stable and compensated -Continue as needed bronchodilator -Not longer using Singulair.   4-history of neuropathy -Continue treatment with Neurontin.  Consultants: Gastroenterology service. Procedures performed: See below for x-ray reports.  Endoscopy. Disposition: Home Diet recommendation: Heart healthy diet.  DISCHARGE MEDICATION: Allergies as of 04/02/2023       Reactions   Penicillins Rash, Other (See Comments)   40 years ago, caused rash and tachycardia        Medication List     STOP taking these medications    esomeprazole 20 MG capsule Commonly known as: NEXIUM       TAKE these medications    docusate sodium 100 MG capsule Commonly known as: COLACE Take 100 mg by mouth at bedtime.   gabapentin 300 MG capsule Commonly known as: NEURONTIN Take 300 mg by mouth 3 (three) times daily as needed (pain). 2 capsules at bedtime and 2  more during the day as needed   Iron (Ferrous Sulfate) 325 (65 Fe) MG Tabs Take 325 mg by mouth in  the morning and at bedtime.   multivitamin with minerals Tabs tablet Take 1 tablet by mouth daily. Centrum Silver   pantoprazole 40 MG tablet Commonly known as: Protonix Take 1 tablet (40 mg total) by mouth 2 (two) times daily.   vitamin C 1000 MG tablet Take 1,000 mg by mouth daily.   Vitamin D 50 MCG (2000 UT) tablet Take 2,000 Units by mouth daily.        Follow-up Information     Vyas, Dhruv B, MD. Schedule an appointment as soon as possible for a visit in 10 day(s).   Specialty: Internal Medicine Contact information: [address] [place] [address] [phone]                Discharge Exam: Filed Weights   03/31/23 1201 03/31/23 2309  Weight: 83.9 kg 84.8 kg   General exam: Alert, awake, oriented x 3; no chest pain, no shortness of breath, no nausea, no vomiting.  Feeling ready to go home. Respiratory system: Clear to auscultation. Respiratory effort normal.  Good saturation on room air.  No using accessory muscles. Cardiovascular system:RRR. No rubs or gallops. Gastrointestinal system: Abdomen is nondistended, soft and nontender. No organomegaly or masses felt. Normal bowel sounds heard. Central nervous system: Alert and oriented. No focal neurological deficits. Extremities: No cyanosis or clubbing. Skin: No petechiae. Psychiatry: Judgement and insight appear normal. Mood & affect appropriate.    Condition at discharge: Stable and improved.  The results of significant diagnostics from this hospitalization (including imaging, microbiology, ancillary and laboratory) are listed below for reference.   Imaging Studies: DG Chest 2 View  Result Date: 03/31/2023 CLINICAL DATA:  Shortness of breath. EXAM: CHEST - 2 VIEW COMPARISON:  October 29, 2015. FINDINGS: The heart size and mediastinal contours are within normal limits. Both lungs are clear. The visualized skeletal structures are unremarkable. IMPRESSION: No active cardiopulmonary disease. Electronically Signed    By: Lupita Raider M.D.   On: 03/31/2023 15:06    Microbiology: Results for orders placed or performed during the hospital encounter of 03/31/23  MRSA Next Gen by PCR, Nasal     Status: None   Collection Time: 03/31/23 10:58 PM   Specimen: Nasal Mucosa; Nasal Swab  Result Value Ref Range Status   MRSA by PCR Next Gen NOT DETECTED NOT DETECTED Final    Comment: (NOTE) The GeneXpert MRSA Assay (FDA approved for NASAL specimens only), is one component of a comprehensive MRSA colonization surveillance program. It is not intended to diagnose MRSA infection nor to guide or monitor treatment for MRSA infections. Test performance is not FDA approved in patients less than 72 years old. Performed at [facility], [address], [place], [place] [address]     Labs: CBC: Recent Labs  Lab 03/31/23 1238 04/01/23 0517 04/02/23 0341  WBC 4.5 8.0 4.7  NEUTROABS 3.1  --   --   HGB 4.6* 7.3* 8.4*  HCT 17.7* 25.3* 27.9*  MCV 80.8 78.3* 80.6  PLT 303 251 206   Basic Metabolic Panel: Recent Labs  Lab 03/31/23 1238 04/01/23 0517  NA 140 140  K 4.2 3.4*  CL 108 109  CO2 22 24  GLUCOSE 98 89  BUN 23 21  CREATININE 1.36* 1.26*  CALCIUM 9.2 8.5*   Liver Function Tests: Recent Labs  Lab 03/31/23 1238  AST 20  ALT 15  ALKPHOS 51  BILITOT 0.3  PROT 6.4*  ALBUMIN 3.6   CBG: No results for input(s): "GLUCAP" in the last 168 hours.  Discharge time spent: greater than 30 minutes.  Signed: Vassie Loll, MD [facility] 04/02/2023

## 2023-04-02 NOTE — Progress Notes (Signed)
Educated patient on discharge instructions and medication regimen. IV removed from right arm. Son at bedside during discharge instructions and drove patient home. No issues during discharge.

## 2023-04-04 ENCOUNTER — Other Ambulatory Visit (INDEPENDENT_AMBULATORY_CARE_PROVIDER_SITE_OTHER): Payer: Self-pay | Admitting: Gastroenterology

## 2023-04-04 DIAGNOSIS — B9681 Helicobacter pylori [H. pylori] as the cause of diseases classified elsewhere: Secondary | ICD-10-CM

## 2023-04-04 LAB — TYPE AND SCREEN
ABO/RH(D): A POS
Antibody Screen: NEGATIVE
Donor AG Type: NEGATIVE
Donor AG Type: NEGATIVE
Donor AG Type: NEGATIVE
Unit division: 0
Unit division: 0
Unit division: 0
Unit division: 0

## 2023-04-04 LAB — BPAM RBC
Blood Product Expiration Date: 202412032359
Blood Product Expiration Date: 202412042359
Blood Product Expiration Date: 202412092359
Blood Product Expiration Date: 202412112359
ISSUE DATE / TIME: 202411082048
ISSUE DATE / TIME: 202411082356
ISSUE DATE / TIME: 202411090930
Unit Type and Rh: 6200
Unit Type and Rh: 6200
Unit Type and Rh: 6200
Unit Type and Rh: 6200

## 2023-04-04 LAB — SURGICAL PATHOLOGY

## 2023-04-04 MED ORDER — TETRACYCLINE HCL 500 MG PO CAPS: 500.0000 mg | ORAL_CAPSULE | Freq: Four times a day (QID) | ORAL | 0 refills | Status: AC

## 2023-04-04 MED ORDER — METRONIDAZOLE 500 MG PO TABS: 500.0000 mg | ORAL_TABLET | Freq: Three times a day (TID) | ORAL | 0 refills | Status: AC

## 2023-04-04 MED ORDER — BISMUTH 262 MG PO CHEW: 2.0000 | CHEWABLE_TABLET | Freq: Four times a day (QID) | ORAL | 0 refills | Status: DC

## 2023-04-07 ENCOUNTER — Encounter (HOSPITAL_COMMUNITY): Payer: Self-pay | Admitting: Gastroenterology

## 2023-04-08 NOTE — Progress Notes (Unsigned)
[facility] [address] [place], [place] [address]   CLINIC:  Medical Oncology/Hematology  PCP:  Ignatius Specking, MD [address] [place] [address] [phone]   REASON FOR VISIT:  Follow-up for severe normocytic anemia  CURRENT THERAPY: Oral iron, IV iron, intermittent PRBC transfusions  INTERVAL HISTORY:   Ms. Valerie Griffin 89 y.o. female returns for routine follow-up of her severe normocytic anemia.  She was last evaluated via telemedicine visit by Rojelio Brenner PA-C on 10/18/2022.  At that time, she was recommended for Feraheme x 3 with close outpatient follow-up.  However, patient called and canceled her iron infusions as well as her follow-up appointments due to concerns regarding the cost of IV iron.  ***  In the interim since her last visit, she was admitted inpatient from 03/31/2023 through 04/02/2023 for severe anemia.  Hemoglobin on admission was 4.6, improved to Hgb 8.4 s/p 3 units PRBC.  Small bowel enteroscopy (04/01/2023) revealed esophageal ulcer, 9 cm hiatal hernia, erosive gastropathy, and nonbleeding gastric ulcers; no active bleeding or stigmata of recent bleeding noted.    At today's visit, she reports feeling ***. *** Energy *** She denies any obvious melena or hematochezia. *** She has occasional "normal" headaches and notes some gait instability and poor sleep quality. *** She denies any pica, restless legs, chest pain, dyspnea on exertion, lightheadedness, or syncope. *** Still taking iron tablet once or twice daily?  ***  She has 50***% energy and 75***% appetite. She endorses that she is maintaining a stable weight.  ASSESSMENT & PLAN:  1.  Severe normocytic anemia: - Patient seen at the request of Dr. Irene Limbo (hospitalist) after hospitalization for from 07/20/2022 - 07/23/2022 for acute blood loss anemia secondary to gastrointestinal hemorrhage (presumed upper GI bleed with melena) Critical anemia on 07/20/2022 with Hgb 5.2, s/p 2 units PRBC EGD  (07/21/2022) noncritical peptic stricture, large hiatal hernia.  No bleeding lesion in upper GI tract. Small bowel Givens capsule study (07/22/2022): Melena noted, but no obvious upper or mid bowel source of melena. Colonoscopy (07/23/2022): Polyps x 6, multiple largemouth and small mouth diverticula in sigmoid colon - She was previously seen at [facility] by Dr. Angelene Giovanni, received Feraheme on 06/04/2020 and 06/15/2020.  Previously received PRBC x 2 at [facility] - Hospitalized 03/31/2023 - 04/02/2023 with Hgb 4.6, improved to Hgb 8.4 s/p 3 units PRBC.   Small bowel enteroscopy (04/01/2023) revealed esophageal ulcer, 9 cm hiatal hernia, erosive gastropathy, and nonbleeding gastric ulcers; no active bleeding or stigmata of recent bleeding noted.   - Hematology workup (08/05/2022): Low haptoglobin <10, DAT IgG positive (complement negative).  LDH mildly elevated 218.  (Bilirubin not checked) - consider that these were likely false positives after recent blood transfusions, especially since repeat hemolysis labs were NEGATIVE  Reticulocytes 1.4% Ferritin 20, iron saturation 23%, TIBC 459.  Elevated B12 1040, normal MMA.  Normal folate and copper. SPEP and immunofixation negative.  Kappa light chains elevated at 40.1, with normal lambda 26.0, normal FLC ratio 1.54 (consistent with CKD) Prior labs showed CKD stage IIIa/b - She has been taking oral iron therapy for many years (currently taking ferrous sulfate twice daily).  *** - Received IV iron with Feraheme x 2 on 08/18/2022 and 08/25/2022. -- She denies any rectal bleeding or melena*** - Most recent CBC/differential (04/02/2023) shows Hgb 8.4/MCV 80.6.  Labs during hospitalization (03/31/2023) showed severe iron deficiency (ferritin 14, iron saturation 2%, TIBC 459).  Normal B12 and folate.  Baseline CKD  stage IIIb. - PLAN: Recommend Feraheme x 3 (patient is hesitant due to many other appointments for herself and her son, but ultimately agrees to additional  IV iron if it is necessary)*** -- Repeat CBC/D, BB sample, and iron panel in 8 weeks*** - If Hgb remains <10.0 despite optimization of iron, would recommend starting ESA. - Continue GI follow-up   2.  Pulmonary embolism: - Diagnosis in 07/2020.  Treated with 6 months of Xarelto.  Unclear by history whether it was provoked or unprovoked.   3.  Social/family history: - She lives at home with her son.  Walks with the help of walker.  She is able to wash dishes, and occasionally cooks.  Prior to retirement, she did secretarial work in an office.  Never smoker.  She is accompanied by her son today. - No family history of anemia.  Daughter had anal cancer.  Father had prostate cancer.  Great grandfather had cancer.  Son died of stomach cancer.   PLAN SUMMARY:*** >> Feraheme x3  >> Labs in 8 weeks = CBC/D, BB sample, ferritin, iron/TIBC >> OFFICE visit in 8 weeks (same day as labs)      REVIEW OF SYSTEMS: ***  Review of Systems  Constitutional:  Positive for fatigue. Negative for appetite change, chills, diaphoresis, fever and unexpected weight change.  HENT:   Negative for lump/mass and nosebleeds.   Eyes:  Negative for eye problems.  Respiratory:  Positive for cough (allergies). Negative for hemoptysis and shortness of breath.   Cardiovascular:  Negative for chest pain, leg swelling and palpitations.  Gastrointestinal:  Negative for abdominal pain, blood in stool, constipation, diarrhea, nausea and vomiting.  Genitourinary:  Negative for hematuria.   Musculoskeletal:  Positive for gait problem.  Skin: Negative.   Neurological:  Positive for dizziness, gait problem, headaches and numbness. Negative for light-headedness.  Hematological:  Does not bruise/bleed easily.  Psychiatric/Behavioral:  Positive for sleep disturbance.      PHYSICAL EXAM:  ECOG PERFORMANCE STATUS: 2 - Symptomatic, <50% confined to bed *** There were no vitals filed for this visit. There were no vitals filed for  this visit. Physical Exam Constitutional:      Appearance: Normal appearance. She is obese.  Cardiovascular:     Heart sounds: Normal heart sounds.  Pulmonary:     Breath sounds: Normal breath sounds.  Neurological:     General: No focal deficit present.     Mental Status: Mental status is at baseline.  Psychiatric:        Behavior: Behavior normal. Behavior is cooperative.     PAST MEDICAL/SURGICAL HISTORY:  Past Medical History:  Diagnosis Date   Anemia    Arthritis    Constipation    Dysphagia 08/16/2016   Esophageal stenosis    GERD (gastroesophageal reflux disease)    Past Surgical History:  Procedure Laterality Date   ABDOMINAL HYSTERECTOMY     APPENDECTOMY     BALLOON DILATION N/A 06/11/2020   Procedure: BALLOON DILATION;  Surgeon: Malissa Hippo, MD;  [facility];  Service: Endoscopy;  Laterality: N/A;   BIOPSY  04/01/2023   Procedure: BIOPSY;  Surgeon: Marguerita Merles, Reuel Boom, MD;  [facility];  Service: Gastroenterology;;   broken arm and wrist with plate and scews rt wrist Right    CHOLECYSTECTOMY     COLONOSCOPY     COLONOSCOPY WITH PROPOFOL N/A 07/23/2022   Procedure: COLONOSCOPY WITH PROPOFOL;  Surgeon: Dolores Frame, MD;  [facility];  Service: Gastroenterology;  Laterality: N/A;   complete hysterectomy'     fibroid tumors bleeding   ENTEROSCOPY N/A 04/01/2023   Procedure: ENTEROSCOPY;  Surgeon: Dolores Frame, MD;  [facility];  Service: Gastroenterology;  Laterality: N/A;   ESOPHAGEAL DILATION N/A 08/22/2016   Procedure: ESOPHAGEAL DILATION;  Surgeon: Malissa Hippo, MD;  [facility];  Service: Endoscopy;  Laterality: N/A;   ESOPHAGEAL DILATION N/A 09/14/2016   Procedure: ESOPHAGEAL DILATION;  Surgeon: Malissa Hippo, MD;  [facility];  Service: Endoscopy;  Laterality: N/A;   ESOPHAGEAL DILATION N/A 07/26/2017   Procedure: ESOPHAGEAL DILATION;  Surgeon: Malissa Hippo, MD;  [facility];  Service: Endoscopy;  Laterality: N/A;   ESOPHAGEAL DILATION N/A 08/31/2017   Procedure: ESOPHAGEAL DILATION;  Surgeon: Malissa Hippo, MD;  [facility];  Service: Endoscopy;  Laterality: N/A;   ESOPHAGEAL DILATION N/A 10/26/2017   Procedure: ESOPHAGEAL DILATION;  Surgeon: Malissa Hippo, MD;  [facility];  Service: Endoscopy;  Laterality: N/A;   ESOPHAGEAL DILATION N/A 02/07/2018   Procedure: ESOPHAGEAL DILATION;  Surgeon: Malissa Hippo, MD;  [facility];  Service: Endoscopy;  Laterality: N/A;   ESOPHAGEAL DILATION N/A 05/31/2019   Procedure: ESOPHAGEAL DILATION;  Surgeon: Malissa Hippo, MD;  [facility];  Service: Endoscopy;  Laterality: N/A;   ESOPHAGEAL DILATION N/A 06/14/2019   Procedure: ESOPHAGEAL DILATION;  Surgeon: Malissa Hippo, MD;  [facility];  Service: Endoscopy;  Laterality: N/A;   ESOPHAGEAL DILATION N/A 07/04/2019   Procedure: ESOPHAGEAL DILATION;  Surgeon: Malissa Hippo, MD;  [facility];  Service: Endoscopy;  Laterality: N/A;  730   ESOPHAGEAL DILATION N/A 08/01/2019   Procedure: ESOPHAGEAL DILATION;  Surgeon: Malissa Hippo, MD;  [facility];  Service: Endoscopy;  Laterality: N/A;   ESOPHAGEAL DILATION N/A 05/14/2020   Procedure: ESOPHAGEAL DILATION;  Surgeon: Malissa Hippo, MD;  [facility];  Service: Endoscopy;  Laterality: N/A;   ESOPHAGOGASTRODUODENOSCOPY N/A 08/22/2016   Procedure: ESOPHAGOGASTRODUODENOSCOPY (EGD);  Surgeon: Malissa Hippo, MD;  [facility];  Service: Endoscopy;  Laterality: N/A;   ESOPHAGOGASTRODUODENOSCOPY N/A 09/14/2016   Procedure: ESOPHAGOGASTRODUODENOSCOPY (EGD);  Surgeon: Malissa Hippo, MD;  [facility];  Service: Endoscopy;  Laterality: N/A;  1225   ESOPHAGOGASTRODUODENOSCOPY N/A 07/26/2017   Procedure: ESOPHAGOGASTRODUODENOSCOPY (EGD);  Surgeon: Malissa Hippo, MD;  [facility];  Service: Endoscopy;  Laterality: N/A;  1:45   ESOPHAGOGASTRODUODENOSCOPY N/A 08/31/2017   Procedure: ESOPHAGOGASTRODUODENOSCOPY (EGD);  Surgeon: Malissa Hippo, MD;  [facility];  Service: Endoscopy;  Laterality: N/A;   ESOPHAGOGASTRODUODENOSCOPY N/A 10/26/2017   Procedure: ESOPHAGOGASTRODUODENOSCOPY (EGD);  Surgeon: Malissa Hippo, MD;  [facility];  Service: Endoscopy;  Laterality: N/A;  1115   ESOPHAGOGASTRODUODENOSCOPY N/A 02/07/2018   Procedure: ESOPHAGOGASTRODUODENOSCOPY (EGD);  Surgeon: Malissa Hippo, MD;  [facility];  Service: Endoscopy;  Laterality: N/A;  830   ESOPHAGOGASTRODUODENOSCOPY N/A 07/04/2019   Procedure: ESOPHAGOGASTRODUODENOSCOPY (EGD);  Surgeon: Malissa Hippo, MD;  [facility];  Service: Endoscopy;  Laterality: N/A;  730   ESOPHAGOGASTRODUODENOSCOPY N/A 08/01/2019   Procedure: ESOPHAGOGASTRODUODENOSCOPY (EGD);  Surgeon: Malissa Hippo, MD;  [facility];  Service: Endoscopy;  Laterality: N/A;  125   ESOPHAGOGASTRODUODENOSCOPY N/A 05/14/2020   Procedure: ESOPHAGOGASTRODUODENOSCOPY (EGD);  Surgeon: Malissa Hippo, MD;  [facility];  Service: Endoscopy;  Laterality: N/A;  10:30   ESOPHAGOGASTRODUODENOSCOPY N/A 06/11/2020   Procedure: ESOPHAGOGASTRODUODENOSCOPY (EGD);  Surgeon: Malissa Hippo, MD;  [facility];  Service: Endoscopy;  Laterality: N/A;  7:30   ESOPHAGOGASTRODUODENOSCOPY (EGD) WITH PROPOFOL N/A 05/31/2019   Procedure: ESOPHAGOGASTRODUODENOSCOPY (EGD) WITH PROPOFOL;  Surgeon: Malissa Hippo, MD;  [facility];  Service: Endoscopy;  Laterality: N/A;  2:35   ESOPHAGOGASTRODUODENOSCOPY (EGD) WITH PROPOFOL N/A 06/14/2019   Procedure: ESOPHAGOGASTRODUODENOSCOPY (EGD) WITH PROPOFOL;  Surgeon: Malissa Hippo, MD;  [facility];  Service: Endoscopy;  Laterality: N/A;   ESOPHAGOGASTRODUODENOSCOPY (EGD) WITH PROPOFOL N/A 07/21/2022   Procedure:  ESOPHAGOGASTRODUODENOSCOPY (EGD) WITH PROPOFOL;  Surgeon: Corbin Ade, MD;  [facility];  Service: Endoscopy;  Laterality: N/A;   FOREIGN BODY REMOVAL  05/31/2019   Procedure: FOREIGN BODY REMOVAL;  Surgeon: Malissa Hippo, MD;  [facility];  Service: Endoscopy;;   GIVENS CAPSULE STUDY N/A 07/21/2022   Procedure: GIVENS CAPSULE STUDY;  Surgeon: Corbin Ade, MD;  [facility];  Service: Endoscopy;  Laterality: N/A;   POLYPECTOMY  07/23/2022   Procedure: POLYPECTOMY;  Surgeon: Dolores Frame, MD;  [facility];  Service: Gastroenterology;;    SOCIAL HISTORY:  Social History   Socioeconomic History   Marital status: Widowed    Spouse name: Not on file   Number of children: Not on file   Years of education: Not on file   Highest education level: Not on file  Occupational History   Not on file  Tobacco Use   Smoking status: Never    Passive exposure: Current   Smokeless tobacco: Never  Vaping Use   Vaping status: Never Used  Substance and Sexual Activity   Alcohol use: No   Drug use: No   Sexual activity: Not on file  Other Topics Concern   Not on file  Social History Narrative   Not on file   Social Determinants of Health   Financial Resource Strain: Medium Risk (08/10/2020)   [facility]   Overall Financial Resource Strain (CARDIA)    Difficulty of Paying Living Expenses: Somewhat hard  Food Insecurity: No Food Insecurity (03/31/2023)   Hunger Vital Sign    Worried About Running Out of Food in the Last Year: Never true    Ran Out of Food in the Last Year: Never true  Transportation Needs: No Transportation Needs (03/31/2023)   PRAPARE - Administrator, Civil Service (Medical): No    Lack of Transportation (Non-Medical): No  Physical Activity: Not on file  Stress: Not on file  Social Connections: Not on file  Intimate Partner Violence: Not At Risk (03/31/2023)    Humiliation, Afraid, Rape, and Kick questionnaire    Fear of Current or Ex-Partner: No    Emotionally Abused: No    Physically Abused: No    Sexually Abused: No    FAMILY HISTORY:  Family History  Problem Relation Age of Onset   Heart Problems Mother    Alzheimer's disease Mother    Hypertension Mother    Prostate cancer Father    Heart attack Father    Heart Problems Sister     CURRENT MEDICATIONS:  Outpatient Encounter Medications as of 04/10/2023  Medication Sig   Ascorbic Acid (VITAMIN C) 1000 MG tablet Take 1,000 mg by mouth daily.   Bismuth 262 MG CHEW  Chew 2 each by mouth every 6 (six) hours.   Cholecalciferol (VITAMIN D) 50 MCG (2000 UT) tablet Take 2,000 Units by mouth daily.   docusate sodium (COLACE) 100 MG capsule Take 100 mg by mouth at bedtime.   gabapentin (NEURONTIN) 300 MG capsule Take 300 mg by mouth 3 (three) times daily as needed (pain). 2 capsules at bedtime and 2 more during the day as needed   Iron, Ferrous Sulfate, 325 (65 Fe) MG TABS Take 325 mg by mouth in the morning and at bedtime.   metroNIDAZOLE (FLAGYL) 500 MG tablet Take 1 tablet (500 mg total) by mouth 3 (three) times daily for 14 days.   Multiple Vitamin (MULTIVITAMIN WITH MINERALS) TABS tablet Take 1 tablet by mouth daily. Centrum Silver   pantoprazole (PROTONIX) 40 MG tablet Take 1 tablet (40 mg total) by mouth 2 (two) times daily.   tetracycline (SUMYCIN) 500 MG capsule Take 1 capsule (500 mg total) by mouth 4 (four) times daily for 14 days.   No facility-administered encounter medications on file as of 04/10/2023.    ALLERGIES:  Allergies  Allergen Reactions   Penicillins Rash and Other (See Comments)    40 years ago, caused rash and tachycardia     LABORATORY DATA:  I have reviewed the labs as listed.  CBC    Component Value Date/Time   WBC 4.7 04/02/2023 0341   RBC 3.46 (L) 04/02/2023 0341   HGB 8.4 (L) 04/02/2023 0341   HCT 27.9 (L) 04/02/2023 0341   PLT 206 04/02/2023 0341    MCV 80.6 04/02/2023 0341   MCH 24.3 (L) 04/02/2023 0341   MCHC 30.1 04/02/2023 0341   RDW 19.8 (H) 04/02/2023 0341   LYMPHSABS 1.0 03/31/2023 1238   MONOABS 0.3 03/31/2023 1238   EOSABS 0.1 03/31/2023 1238   BASOSABS 0.0 03/31/2023 1238      Latest Ref Rng & Units 04/01/2023    5:17 AM 03/31/2023   12:38 PM 10/14/2022   10:49 AM  CMP  Glucose 70 - 99 mg/dL 89  98  119   BUN 8 - 23 mg/dL 21  23  20    Creatinine 0.44 - 1.00 mg/dL 1.47  8.29  5.62   Sodium 135 - 145 mmol/L 140  140  137   Potassium 3.5 - 5.1 mmol/L 3.4  4.2  3.9   Chloride 98 - 111 mmol/L 109  108  104   CO2 22 - 32 mmol/L 24  22  26    Calcium 8.9 - 10.3 mg/dL 8.5  9.2  8.4   Total Protein 6.5 - 8.1 g/dL  6.4  6.6   Total Bilirubin <1.2 mg/dL  0.3  0.5   Alkaline Phos 38 - 126 U/L  51  60   AST 15 - 41 U/L  20  21   ALT 0 - 44 U/L  15  14     DIAGNOSTIC IMAGING:  I have independently reviewed the relevant imaging and discussed with the patient.   WRAP UP:  All questions were answered. The patient knows to call the clinic with any problems, questions or concerns.  Medical decision making: Moderate  Time spent on visit: I spent 20 minutes counseling the patient face to face. The total time spent in the appointment was 30 minutes and more than 50% was on counseling.  Carnella Guadalajara, PA-C  ***

## 2023-04-10 ENCOUNTER — Inpatient Hospital Stay: Payer: Medicare Other | Attending: Physician Assistant | Admitting: Physician Assistant

## 2023-04-10 VITALS — BP 182/70 | HR 67 | Temp 96.7°F | Resp 20 | Wt 187.0 lb

## 2023-04-10 DIAGNOSIS — N1832 Chronic kidney disease, stage 3b: Secondary | ICD-10-CM | POA: Insufficient documentation

## 2023-04-10 DIAGNOSIS — D509 Iron deficiency anemia, unspecified: Secondary | ICD-10-CM | POA: Diagnosis present

## 2023-04-10 DIAGNOSIS — Z86711 Personal history of pulmonary embolism: Secondary | ICD-10-CM | POA: Insufficient documentation

## 2023-04-10 DIAGNOSIS — D500 Iron deficiency anemia secondary to blood loss (chronic): Secondary | ICD-10-CM | POA: Diagnosis not present

## 2023-04-10 DIAGNOSIS — Z79899 Other long term (current) drug therapy: Secondary | ICD-10-CM | POA: Insufficient documentation

## 2023-04-10 NOTE — Patient Instructions (Signed)
[facility] **VISIT SUMMARY & IMPORTANT INSTRUCTIONS **   You were seen today by Rojelio Brenner PA-C for your anemia.   Your labs showed that your anemia is from iron deficiency.  This is most likely related to blood loss from your stomach and intestines. We will schedule you for IV iron x 3 doses.  MEDICATIONS: For the time being, stop taking iron pill, since this can interfere with your tetracycline medication.  FOLLOW-UP APPOINTMENT: Labs and office visit in 2 months  ** Thank you for trusting me with your healthcare!  I strive to provide all of my patients with quality care at each visit.  If you receive a survey for this visit, I would be so grateful to you for taking the time to provide feedback.  Thank you in advance!  ~ Jlynn Ly                   Dr. Doreatha Massed   &   Rojelio Brenner, PA-C   - - - - - - - - - - - - - - - - - -    Thank you for choosing [facility] at [facility] to provide your oncology and hematology care.  To afford each patient quality time with our provider, please arrive at least 15 minutes before your scheduled appointment time.   If you have a lab appointment with the Cancer Center please come in thru the Main Entrance and check in at the main information desk.  You need to re-schedule your appointment should you arrive 10 or more minutes late.  We strive to give you quality time with our providers, and arriving late affects you and other patients whose appointments are after yours.  Also, if you no show three or more times for appointments you may be dismissed from the clinic at the providers discretion.     Again, thank you for choosing [facility].  Our hope is that these requests will decrease the amount of time that you wait before being seen by our physicians.       _____________________________________________________________  Should you have questions after your  visit to [facility], please contact our office at [phone] and follow the prompts.  Our office hours are 8:00 a.m. and 4:30 p.m. Monday - Friday.  Please note that voicemails left after 4:00 p.m. may not be returned until the following business day.  We are closed weekends and major holidays.  You do have access to a nurse 24-7, just call the main number to the clinic [phone] and do not press any options, hold on the line and a nurse will answer the phone.    For prescription refill requests, have your pharmacy contact our office and allow 72 hours.

## 2023-04-13 ENCOUNTER — Inpatient Hospital Stay: Payer: Medicare Other

## 2023-04-13 VITALS — BP 173/64 | HR 65 | Temp 97.7°F | Resp 17

## 2023-04-13 DIAGNOSIS — D508 Other iron deficiency anemias: Secondary | ICD-10-CM

## 2023-04-13 DIAGNOSIS — D509 Iron deficiency anemia, unspecified: Secondary | ICD-10-CM | POA: Diagnosis not present

## 2023-04-13 MED ORDER — ACETAMINOPHEN 325 MG PO TABS
650.0000 mg | ORAL_TABLET | Freq: Once | ORAL | Status: AC
  Administered 2023-04-13: 650 mg via ORAL
  Filled 2023-04-13: qty 2

## 2023-04-13 MED ORDER — SODIUM CHLORIDE 0.9 % IV SOLN: INTRAVENOUS | Status: DC

## 2023-04-13 MED ORDER — CETIRIZINE HCL 10 MG PO TABS
10.0000 mg | ORAL_TABLET | Freq: Once | ORAL | Status: AC
Start: 2023-04-13 — End: 2023-04-13
  Administered 2023-04-13: 10 mg via ORAL
  Filled 2023-04-13: qty 1

## 2023-04-13 MED ORDER — SODIUM CHLORIDE 0.9 % IV SOLN
510.0000 mg | Freq: Once | INTRAVENOUS | Status: AC
  Administered 2023-04-13: 510 mg via INTRAVENOUS
  Filled 2023-04-13: qty 510

## 2023-04-13 NOTE — Progress Notes (Signed)
Patient tolerated iron infusion well with no complaints voiced.  Patient left via wheelchair with son in stable condition.  Vital signs stable at discharge.  Follow up as scheduled.

## 2023-04-13 NOTE — Patient Instructions (Signed)
 [facility] - [facility]  Discharge Instructions: Thank you for choosing  Cancer Center to provide your oncology and hematology care.  If you have a lab appointment with the Cancer Center - please note that after April 8th, 2024, all labs will be drawn in the cancer center.  You do not have to check in or register with the main entrance as you have in the past but will complete your check-in in the cancer center.  Wear comfortable clothing and clothing appropriate for easy access to any Portacath or PICC line.   We strive to give you quality time with your provider. You may need to reschedule your appointment if you arrive late (15 or more minutes).  Arriving late affects you and other patients whose appointments are after yours.  Also, if you miss three or more appointments without notifying the office, you may be dismissed from the clinic at the provider's discretion.      For prescription refill requests, have your pharmacy contact our office and allow 72 hours for refills to be completed.    Today you received the following:  Feraheme.  Ferumoxytol Injection What is this medication? FERUMOXYTOL (FER ue MOX i tol) treats low levels of iron in your body (iron deficiency anemia). Iron is a mineral that plays an important role in making red blood cells, which carry oxygen from your lungs to the rest of your body. This medicine may be used for other purposes; ask your health care provider or pharmacist if you have questions. COMMON BRAND NAME(S): Feraheme What should I tell my care team before I take this medication? They need to know if you have any of these conditions: Anemia not caused by low iron levels High levels of iron in the blood Magnetic resonance imaging (MRI) test scheduled An unusual or allergic reaction to iron, other medications, foods, dyes, or preservatives Pregnant or trying to get pregnant Breastfeeding How should I  use this medication? This medication is injected into a vein. It is given by your care team in a hospital or clinic setting. Talk to your care team the use of this medication in children. Special care may be needed. Overdosage: If you think you have taken too much of this medicine contact a poison control center or emergency room at once. NOTE: This medicine is only for you. Do not share this medicine with others. What if I miss a dose? It is important not to miss your dose. Call your care team if you are unable to keep an appointment. What may interact with this medication? Other iron products This list may not describe all possible interactions. Give your health care provider a list of all the medicines, herbs, non-prescription drugs, or dietary supplements you use. Also tell them if you smoke, drink alcohol, or use illegal drugs. Some items may interact with your medicine. What should I watch for while using this medication? Visit your care team regularly. Tell your care team if your symptoms do not start to get better or if they get worse. You may need blood work done while you are taking this medication. You may need to follow a special diet. Talk to your care team. Foods that contain iron include: whole grains/cereals, dried fruits, beans, or peas, leafy green vegetables, and organ meats (liver, kidney). What side effects may I notice from receiving this medication? Side effects that you should report to your care team as soon as possible:  Allergic reactions--skin rash, itching, hives, swelling of the face, lips, tongue, or throat Low blood pressure--dizziness, feeling faint or lightheaded, blurry vision Shortness of breath Side effects that usually do not require medical attention (report to your care team if they continue or are bothersome): Flushing Headache Joint pain Muscle pain Nausea Pain, redness, or irritation at injection site This list may not describe all possible side  effects. Call your doctor for medical advice about side effects. You may report side effects to FDA at 1-800-FDA-1088. Where should I keep my medication? This medication is given in a hospital or clinic. It will not be stored at home. NOTE: This sheet is a summary. It may not cover all possible information. If you have questions about this medicine, talk to your doctor, pharmacist, or health care provider.  2024 Elsevier/Gold Standard (2022-10-14 00:00:00)     To help prevent nausea and vomiting after your treatment, we encourage you to take your nausea medication as directed.  BELOW ARE SYMPTOMS THAT SHOULD BE REPORTED IMMEDIATELY: *FEVER GREATER THAN 100.4 F (38 C) OR HIGHER *CHILLS OR SWEATING *NAUSEA AND VOMITING THAT IS NOT CONTROLLED WITH YOUR NAUSEA MEDICATION *UNUSUAL SHORTNESS OF BREATH *UNUSUAL BRUISING OR BLEEDING *URINARY PROBLEMS (pain or burning when urinating, or frequent urination) *BOWEL PROBLEMS (unusual diarrhea, constipation, pain near the anus) TENDERNESS IN MOUTH AND THROAT WITH OR WITHOUT PRESENCE OF ULCERS (sore throat, sores in mouth, or a toothache) UNUSUAL RASH, SWELLING OR PAIN  UNUSUAL VAGINAL DISCHARGE OR ITCHING   Items with * indicate a potential emergency and should be followed up as soon as possible or go to the Emergency Department if any problems should occur.  Please show the CHEMOTHERAPY ALERT CARD or IMMUNOTHERAPY ALERT CARD at check-in to the Emergency Department and triage nurse.  Should you have questions after your visit or need to cancel or reschedule your appointment, please contact [place] [facility] - A DEPT OF Eligh[facility] Bridegroom [facility] [phone]  and follow the prompts.  Office hours are 8:00 a.m. to 4:30 p.m. Monday - Friday. Please note that voicemails left after 4:00 p.m. may not be returned until the following business day.  We are closed weekends and major holidays. You have access to a nurse at all times for urgent  questions. Please call the main number to the clinic [phone] and follow the prompts.  For any non-urgent questions, you may also contact your provider using MyChart. We now offer e-Visits for anyone 79 and older to request care online for non-urgent symptoms. For details visit [email or web address].   Also download the MyChart app! Go to the app store, search "MyChart", open the app, select , and log in with your MyChart username and password.

## 2023-04-17 ENCOUNTER — Emergency Department (HOSPITAL_COMMUNITY): Payer: Medicare Other

## 2023-04-17 ENCOUNTER — Other Ambulatory Visit: Payer: Self-pay

## 2023-04-17 ENCOUNTER — Encounter (HOSPITAL_COMMUNITY): Payer: Self-pay | Admitting: Radiology

## 2023-04-17 ENCOUNTER — Emergency Department (HOSPITAL_COMMUNITY)
Admission: EM | Admit: 2023-04-17 | Discharge: 2023-04-18 | Disposition: A | Discharge status: Home / Self Care | Payer: Medicare Other | Attending: Emergency Medicine | Admitting: Emergency Medicine

## 2023-04-17 DIAGNOSIS — D509 Iron deficiency anemia, unspecified: Secondary | ICD-10-CM | POA: Diagnosis not present

## 2023-04-17 DIAGNOSIS — R531 Weakness: Secondary | ICD-10-CM | POA: Diagnosis present

## 2023-04-17 DIAGNOSIS — R1084 Generalized abdominal pain: Secondary | ICD-10-CM | POA: Diagnosis not present

## 2023-04-17 DIAGNOSIS — R309 Painful micturition, unspecified: Secondary | ICD-10-CM | POA: Insufficient documentation

## 2023-04-17 LAB — BASIC METABOLIC PANEL
Anion gap: 6 (ref 5–15)
BUN: 18 mg/dL (ref 8–23)
CO2: 22 mmol/L (ref 22–32)
Calcium: 8.4 mg/dL — ABNORMAL LOW (ref 8.9–10.3)
Chloride: 109 mmol/L (ref 98–111)
Creatinine, Ser: 1.07 mg/dL — ABNORMAL HIGH (ref 0.44–1.00)
GFR, Estimated: 50 mL/min — ABNORMAL LOW (ref >60)
Glucose, Bld: 104 mg/dL — ABNORMAL HIGH (ref 70–99)
Potassium: 5 mmol/L (ref 3.5–5.1)
Sodium: 137 mmol/L (ref 135–145)

## 2023-04-17 LAB — CBC
HCT: 29.2 % — ABNORMAL LOW (ref 36.0–46.0)
Hemoglobin: 8.9 g/dL — ABNORMAL LOW (ref 12.0–15.0)
MCH: 25.9 pg — ABNORMAL LOW (ref 26.0–34.0)
MCHC: 30.5 g/dL (ref 30.0–36.0)
MCV: 85.1 fL (ref 80.0–100.0)
Platelets: 237 10*3/uL (ref 150–400)
RBC: 3.43 MIL/uL — ABNORMAL LOW (ref 3.87–5.11)
RDW: 26.4 % — ABNORMAL HIGH (ref 11.5–15.5)
WBC: 4.5 10*3/uL (ref 4.0–10.5)
nRBC: 0 % (ref 0.0–0.2)

## 2023-04-17 LAB — URINALYSIS, ROUTINE W REFLEX MICROSCOPIC
Bilirubin Urine: NEGATIVE
Glucose, UA: NEGATIVE mg/dL
Ketones, ur: 5 mg/dL — AB
Leukocytes,Ua: NEGATIVE
Nitrite: NEGATIVE
Protein, ur: 100 mg/dL — AB
Specific Gravity, Urine: 1.016 (ref 1.005–1.030)
pH: 6 (ref 5.0–8.0)

## 2023-04-17 MED ORDER — GADOBUTROL 1 MMOL/ML IV SOLN
8.0000 mL | Freq: Once | INTRAVENOUS | Status: AC | PRN
  Administered 2023-04-17: 8 mL via INTRAVENOUS

## 2023-04-17 NOTE — ED Notes (Signed)
Patient straight cathed for urine, due to unable to urinate on bedpan

## 2023-04-17 NOTE — ED Triage Notes (Signed)
Pt daughter states that yesterday around 67 or 1pm the patient started feeling weak and was unable to get on and off the commode on her own like normal. Pt daughter states that all day yesterday she would say she was having diarrhea but she didn't have diarrhea. Pt was able to get on the toilet but unable to get off. Pt states she isnt able to stand on her own. Pt son was having to pick up up off the commode. Pt son states that she was speaking but not in complete sentences. Daughter states she didn't have her dentures in and that makes a difference in her speech per the daughter. Pt was assisted out of the car. Pt was able to stand on her own but kept saying she couldn't. Initial stoke scale negative. Pt states she did have a episode of urinary incontinence in the bed today.

## 2023-04-17 NOTE — ED Notes (Signed)
Patient tolerated IV well, complaints she is not sure why she is here and that she is sure she has a UTI and that daughter does not believe she knows what she is talking about.

## 2023-04-17 NOTE — ED Notes (Signed)
Pt has been in MRI for last 15 min, daughter accompanied.

## 2023-04-17 NOTE — ED Triage Notes (Signed)
Pt daughter now states that she was complaining of having diarrhea and burning, but patient was only urinating when she used the bathroom. Pt primary care provider called a antibiotic in for her today due to suspected UTI. Pt took one dose but due to weakness she was brought here.

## 2023-04-17 NOTE — ED Provider Notes (Signed)
[place] EMERGENCY DEPARTMENT AT [facility] Provider Note   CSN: [id number] Arrival date & time: 04/17/23  1701     History {Add pertinent medical, surgical, social history, OB history to HPI:1} Chief Complaint  Patient presents with   Weakness    Valerie Griffin is a 89 y.o. female IDA (hx of blood transfusions), PE (no AC), GERD presents to ED for evaluation of generalized weakness. She lives at home with son and he reports that she has been "screaming for help up from toilet" starting yesterday.  Patient reports that she is too weak to get up from toilet on her own. She endorses some burning with urination.  Patient's daughter at bedside denies recent falls. She reports that she believes there is "a possibility she may have dementia" but no formal diagnosis has been made. She reports that patient "isn't even trying" when attempting to get up from toilet.  She had a televisit with her PCP today and was provided an antibiotic for a possible UTI. She has taken one dose total.  The history is provided by the patient and a relative.  Weakness Associated symptoms: no abdominal pain, no chest pain, no cough, no diarrhea, no dizziness, no fever, no headaches, no nausea, no seizures, no shortness of breath and no vomiting       Home Medications Prior to Admission medications   Medication Sig Start Date End Date Taking? Authorizing Provider  Ascorbic Acid (VITAMIN C) 1000 MG tablet Take 1,000 mg by mouth daily.    [provider]  Bismuth 262 MG CHEW Chew 2 each by mouth every 6 (six) hours. 04/04/23   Dolores Frame, MD  Cholecalciferol (VITAMIN D) 50 MCG (2000 UT) tablet Take 2,000 Units by mouth daily.    [provider]  docusate sodium (COLACE) 100 MG capsule Take 100 mg by mouth at bedtime.    [provider]  gabapentin (NEURONTIN) 300 MG capsule Take 300 mg by mouth 3 (three) times daily as needed (pain). 2 capsules at bedtime  and 2 more during the day as needed 10/19/21   [provider]  Iron, Ferrous Sulfate, 325 (65 Fe) MG TABS Take 325 mg by mouth in the morning and at bedtime. 07/23/22   Standley Brooking, MD  metroNIDAZOLE (FLAGYL) 500 MG tablet Take 1 tablet (500 mg total) by mouth 3 (three) times daily for 14 days. 04/04/23 04/18/23  Dolores Frame, MD  Multiple Vitamin (MULTIVITAMIN WITH MINERALS) TABS tablet Take 1 tablet by mouth daily. Centrum Silver    [provider]  pantoprazole (PROTONIX) 40 MG tablet Take 1 tablet (40 mg total) by mouth 2 (two) times daily. 04/02/23 04/01/24  Vassie Loll, MD  tetracycline (SUMYCIN) 500 MG capsule Take 1 capsule (500 mg total) by mouth 4 (four) times daily for 14 days. 04/04/23 04/18/23  Dolores Frame, MD      Allergies    Penicillins    Review of Systems   Review of Systems  Constitutional:  Negative for chills, fatigue and fever.  Respiratory:  Negative for cough, chest tightness, shortness of breath and wheezing.   Cardiovascular:  Negative for chest pain and palpitations.  Gastrointestinal:  Negative for abdominal pain, constipation, diarrhea, nausea and vomiting.  Genitourinary:        Burning with urination  Neurological:  Positive for weakness. Negative for dizziness, seizures, light-headedness, numbness and headaches.    Physical Exam Updated Vital Signs BP (!) 147/68   Pulse  87   Temp 98.3 F (36.8 C) (Oral)   Resp 19   Ht 5\' 5"  (1.651 m)   Wt 84.8 kg   SpO2 96%   BMI 31.12 kg/m  Physical Exam Vitals and nursing note reviewed.  Constitutional:      General: She is not in acute distress.    Appearance: Normal appearance. She is not ill-appearing.  HENT:     Head: Normocephalic and atraumatic.  Eyes:     General: No scleral icterus.       Right eye: No discharge.        Left eye: No discharge.     Extraocular Movements: Extraocular movements intact.     Conjunctiva/sclera: Conjunctivae normal.      Pupils: Pupils are equal, round, and reactive to light.  Cardiovascular:     Rate and Rhythm: Normal rate.     Pulses: Normal pulses.  Pulmonary:     Effort: Pulmonary effort is normal. No respiratory distress.     Breath sounds: Normal breath sounds. No wheezing or rales.  Chest:     Chest wall: No tenderness.  Abdominal:     General: There is no distension.     Palpations: Abdomen is soft.     Tenderness: There is abdominal tenderness (generalized TTP with light palpation). There is no right CVA tenderness, left CVA tenderness or guarding.  Musculoskeletal:     Right lower leg: No edema.     Left lower leg: No edema.  Skin:    General: Skin is warm.     Coloration: Skin is not jaundiced or pale.  Neurological:     Mental Status: She is alert and oriented to person, place, and time. Mental status is at baseline.     Cranial Nerves: No cranial nerve deficit.     Sensory: No sensory deficit.     Motor: No weakness.     Coordination: Coordination normal.     Deep Tendon Reflexes: Reflexes normal.    ED Results / Procedures / Treatments   Labs (all labs ordered are listed, but only abnormal results are displayed) Labs Reviewed  BASIC METABOLIC PANEL  CBC  URINALYSIS, ROUTINE W REFLEX MICROSCOPIC    EKG None  Radiology No results found.  Procedures Procedures  {Document cardiac monitor, telemetry assessment procedure when appropriate:1}  Medications Ordered in ED Medications - No data to display  ED Course/ Medical Decision Making/ A&P   {   Click here for ABCD2, HEART and other calculatorsREFRESH Note before signing :1}                              Medical Decision Making Amount and/or Complexity of Data Reviewed Labs: ordered. Radiology: ordered.   Patient presents to the ED for concern of generalized weakness and abdominal tenderness, this involves an extensive number of treatment options, and is a complaint that carries with it a high risk of  complications and morbidity.  The differential diagnosis includes ultralight abnormality, infection, UTI, intra-abdominal pathology.  Is not exhaustive list   Co morbidities that complicate the patient evaluation  IDA (hx of blood transfusions), PE (no AC), GERD   Additional history obtained:  Additional history obtained from Family, Nursing, Outside Medical Records, and Past Admission   External records from outside source obtained and reviewed including triage RN note   Lab Tests:  I Ordered, and personally interpreted labs.  The pertinent results include:  Hgb  8.9 (baseline 9.6-10.3 but improved from Hgb 2 weeks ago of 8.4) Elevated creatinine 1.07 (baseline 1.02-1.26)   Imaging Studies ordered:  I ordered imaging studies including MR of abdomen CT scan down at AP for repair  I independently visualized and interpreted imaging which showed *** I agree with the radiologist interpretation   Cardiac Monitoring:  The patient was maintained on a cardiac monitor.  I personally viewed and interpreted the cardiac monitored which showed an underlying rhythm of: NSR with pathological Q waves in inferior leads. Unchanged from prior   Medicines ordered and prescription drug management:  I ordered medication including ***  for ***  Reevaluation of the patient after these medicines showed that the patient {resolved/improved/worsened:23923::"improved"} I have reviewed the patients home medicines and have made adjustments as needed   Test Considered:  ***   Critical Interventions:  ***   Consultations Obtained:  I requested consultation with the ***,  and discussed lab and imaging findings as well as pertinent plan - they recommend: ***   Problem List / ED Course:  Generalized weakness   Reevaluation:  After the interventions noted above, I reevaluated the patient and found that they have :{resolved/improved/worsened:23923::"improved"}    Dispostion:  After  consideration of the diagnostic results and the patients response to treatment, I feel that the patent would benefit from ***.    {Document critical care time when appropriate:1} {Document review of labs and clinical decision tools ie heart score, Chads2Vasc2 etc:1}  {Document your independent review of radiology images, and any outside records:1} {Document your discussion with family members, caretakers, and with consultants:1} {Document social determinants of health affecting pt's care:1} {Document your decision making why or why not admission, treatments were needed:1} Final Clinical Impression(s) / ED Diagnoses Final diagnoses:  None    Rx / DC Orders ED Discharge Orders     None

## 2023-04-18 LAB — POC OCCULT BLOOD, ED: Fecal Occult Bld: NEGATIVE

## 2023-04-18 NOTE — ED Notes (Signed)
ED Provider at bedside. 

## 2023-04-18 NOTE — Discharge Instructions (Addendum)
Follow-up with your primary doctor in the next few days, and return to the ER if symptoms significantly worsen or change.  Case management should reach out to you to discuss options for home health.

## 2023-04-18 NOTE — ED Notes (Signed)
Pt family member at bedside, upset that it is taking so long for the MRI to be read. Pt continues to ask if patient will be admitted and this nurse continues to make her aware that the MRI hasn't been read so I was unable to provide her with this information.

## 2023-04-19 LAB — URINE CULTURE: Culture: NO GROWTH

## 2023-04-28 ENCOUNTER — Inpatient Hospital Stay: Payer: Medicare Other | Attending: Hematology

## 2023-04-28 VITALS — BP 145/54 | HR 68 | Temp 96.7°F | Resp 18

## 2023-04-28 DIAGNOSIS — D508 Other iron deficiency anemias: Secondary | ICD-10-CM

## 2023-04-28 DIAGNOSIS — D649 Anemia, unspecified: Secondary | ICD-10-CM | POA: Diagnosis present

## 2023-04-28 DIAGNOSIS — Z7901 Long term (current) use of anticoagulants: Secondary | ICD-10-CM | POA: Insufficient documentation

## 2023-04-28 DIAGNOSIS — Z86711 Personal history of pulmonary embolism: Secondary | ICD-10-CM | POA: Diagnosis not present

## 2023-04-28 MED ORDER — CETIRIZINE HCL 10 MG PO TABS
10.0000 mg | ORAL_TABLET | Freq: Once | ORAL | Status: AC
  Administered 2023-04-28: 10 mg via ORAL
  Filled 2023-04-28: qty 1

## 2023-04-28 MED ORDER — SODIUM CHLORIDE 0.9 % IV SOLN: INTRAVENOUS | Status: DC

## 2023-04-28 MED ORDER — SODIUM CHLORIDE 0.9% FLUSH: 10.0000 mL | Freq: Two times a day (BID) | INTRAVENOUS | Status: DC

## 2023-04-28 MED ORDER — CETIRIZINE HCL 10 MG PO TABS: 5.0000 mg | ORAL_TABLET | Freq: Once | ORAL | Status: DC

## 2023-04-28 MED ORDER — SODIUM CHLORIDE 0.9 % IV SOLN
510.0000 mg | Freq: Once | INTRAVENOUS | Status: AC
  Administered 2023-04-28: 510 mg via INTRAVENOUS
  Filled 2023-04-28: qty 510

## 2023-04-28 MED ORDER — ACETAMINOPHEN 325 MG PO TABS
650.0000 mg | ORAL_TABLET | Freq: Once | ORAL | Status: AC
Start: 2023-04-28 — End: 2023-04-28
  Administered 2023-04-28: 650 mg via ORAL
  Filled 2023-04-28: qty 2

## 2023-04-28 NOTE — Progress Notes (Signed)
Patient presents today for Feraheme infusion per providers order.  Vital signs WNL.  Patient has no new complaints at this time.  Peripheral IV started and blood return noted pre and post infusion.    Stable during infusion without adverse affects.  Vital signs stable.  No complaints at this time.  Discharge from clinic ambulatory in stable condition.  Alert and oriented X 3.  Follow up with [facility] as scheduled.  

## 2023-04-28 NOTE — Patient Instructions (Signed)
[facility] [place] [facility]  Discharge Instructions: Thank you for choosing [facility] to provide your oncology and hematology care.  If you have a lab appointment with the Cancer Center - please note that after April 8th, 2024, all labs will be drawn in the cancer center.  You do not have to check in or register with the main entrance as you have in the past but will complete your check-in in the cancer center.  Wear comfortable clothing and clothing appropriate for easy access to any Portacath or PICC line.   We strive to give you quality time with your provider. You may need to reschedule your appointment if you arrive late (15 or more minutes).  Arriving late affects you and other patients whose appointments are after yours.  Also, if you miss three or more appointments without notifying the office, you may be dismissed from the clinic at the provider's discretion.      For prescription refill requests, have your pharmacy contact our office and allow 72 hours for refills to be completed.    Today you received the following chemotherapy and/or immunotherapy agents Feraheme      To help prevent nausea and vomiting after your treatment, we encourage you to take your nausea medication as directed.  BELOW ARE SYMPTOMS THAT SHOULD BE REPORTED IMMEDIATELY: *FEVER GREATER THAN 100.4 F (38 C) OR HIGHER *CHILLS OR SWEATING *NAUSEA AND VOMITING THAT IS NOT CONTROLLED WITH YOUR NAUSEA MEDICATION *UNUSUAL SHORTNESS OF BREATH *UNUSUAL BRUISING OR BLEEDING *URINARY PROBLEMS (pain or burning when urinating, or frequent urination) *BOWEL PROBLEMS (unusual diarrhea, constipation, pain near the anus) TENDERNESS IN MOUTH AND THROAT WITH OR WITHOUT PRESENCE OF ULCERS (sore throat, sores in mouth, or a toothache) UNUSUAL RASH, SWELLING OR PAIN  UNUSUAL VAGINAL DISCHARGE OR ITCHING   Items with * indicate a potential emergency and should be followed up  as soon as possible or go to the Emergency Department if any problems should occur.  Please show the CHEMOTHERAPY ALERT CARD or IMMUNOTHERAPY ALERT CARD at check-in to the Emergency Department and triage nurse.  Should you have questions after your visit or need to cancel or reschedule your appointment, please contact [facility] [place] - A DEPT OF [facility] Brideg[facility] [phone]  and follow the prompts.  Office hours are 8:00 a.m. to 4:30 p.m. Monday - Friday. Please note that voicemails left after 4:00 p.m. may not be returned until the following business day.  We are closed weekends and major holidays. You have access to a nurse at all times for urgent questions. Please call the main number to the clinic [phone] and follow the prompts.  For any non-urgent questions, you may also contact your provider using MyChart. We now offer e-Visits for anyone 69 and older to request care online for non-urgent symptoms. For details visit [email or web address].   Also download the MyChart app! Go to the app store, search "MyChart", open the app, select [facility], and log in with your MyChart username and password.

## 2023-05-04 ENCOUNTER — Encounter (INDEPENDENT_AMBULATORY_CARE_PROVIDER_SITE_OTHER): Payer: Self-pay | Admitting: Gastroenterology

## 2023-05-04 ENCOUNTER — Ambulatory Visit (INDEPENDENT_AMBULATORY_CARE_PROVIDER_SITE_OTHER): Payer: Medicare Other | Admitting: Gastroenterology

## 2023-05-04 VITALS — BP 147/82 | HR 75 | Temp 98.1°F | Ht 65.0 in | Wt 176.8 lb

## 2023-05-04 DIAGNOSIS — D508 Other iron deficiency anemias: Secondary | ICD-10-CM

## 2023-05-04 DIAGNOSIS — K253 Acute gastric ulcer without hemorrhage or perforation: Secondary | ICD-10-CM

## 2023-05-04 DIAGNOSIS — D509 Iron deficiency anemia, unspecified: Secondary | ICD-10-CM

## 2023-05-04 DIAGNOSIS — B9681 Helicobacter pylori [H. pylori] as the cause of diseases classified elsewhere: Secondary | ICD-10-CM | POA: Diagnosis not present

## 2023-05-04 DIAGNOSIS — K259 Gastric ulcer, unspecified as acute or chronic, without hemorrhage or perforation: Secondary | ICD-10-CM

## 2023-05-04 MED FILL — Ferumoxytol Inj 510 MG/17ML (30 MG/ML) (Elemental Fe): INTRAVENOUS | Qty: 17 | Status: AC

## 2023-05-04 NOTE — Patient Instructions (Signed)
Please resume omeprazole 40mg  twice daily You can continue with imodium, you can take up to 30mL in a 24 hour period, starting with 15 mL then 7.21mL after each additional loose stool. Complete all iron infusions and continue to follow with hematology Please avoid NSAIDs (advil, aleve, naproxen, goody powder, ibuprofen) as these can be very hard on your GI tract, causing inflammation, ulcers and damage to the lining of your GI tract.  We will repeat upper endoscopy in February to ensure healing of ulcer and infection (h pylori) in your stomach  Follow up 3-4 months  It was a pleasure to see you today. I want to create trusting relationships with patients and provide genuine, compassionate, and quality care. I truly value your feedback! please be on the lookout for a survey regarding your visit with me today. I appreciate your input about our visit and your time in completing this!    Pietra Zuluaga L. Jeanmarie Hubert, MSN, APRN, AGNP-C Adult-Gerontology Nurse Practitioner [facility]

## 2023-05-04 NOTE — Progress Notes (Addendum)
Referring Provider: Ignatius Specking, MD Primary Care Physician:  Ignatius Specking, MD Primary GI Physician: Dr. Levon Hedger   Chief Complaint  Patient presents with   Anemia    Follow up anemia. Has had two iron infusions and has next infusion tomorrow. Concerns about after first two infusion she had diarrhea for 2 days after. She took imodium.    HPI:   Valerie Griffin is a 89 y.o. female with past medical history of  severe iron deficiency anemia, history of esophageal stenosis, arthritis   Patient presenting today for follow up of IDA, H pylori, gastric ulcer   Hospitalization for symptomatic IDA in November, denied rectal bleeding or melena.  Hgb 4.6 on admission, transfused 3 units PRBCs with hgb improvement to 7.3  -Patient recommended PPI daily, proceed with enteroscopy as outlined below  -Started on omeprazole 40mg  BID, recommended repeat EGD 3 months, sent quadruple bismith therapy for H pylori  Present:  Patient arrives with her son who states that she had an episode of AMS and had to be seen in the ER and was told to stop taking the rest of her therapy for H pylori. She thinks she had a few days left of the medication. She reports that they tested her urine and said she did not have an infection. I do however not see any documentation of her being advised to stop her H pylori therapy or omeprazole but she is not taking either of these. She has some occasional RLQ pain but denies any upper abdominal discomfort. No rectal bleeding or melena. She is having some diarrhea after her iron infusions, has upcoming (3rd infusion tomorrow).  She is having repeat labs done on 12/27 by hematology to evaluate her anemia. Denies sob, dizziness. She is chronically fatigued/sleepy. Weight is down 11 pounds since the end of November but she reports she is avoiding eating a lot due to having to run to the restroom. Son reports she did eat pretty good yesterday. She is taking imodium for her diarrhea  which began after iron infusions, usually having 2 episodes per day.She is taking liquid imodium, though not exactly sure how much she is taking, but does a few doses per day.     Enteroscopy 04/02/23- Esophageal ulcer with no stigmata of recent                            bleeding.                           - 9 cm hiatal hernia.                           - Erosive gastropathy with no stigmata of recent                            bleeding.                           - Non-bleeding gastric ulcers with a clean ulcer                            base (Forrest Class III). Biopsied-H pylori                           -  Normal examined duodenum.  Last Colonoscopy:07/23/22 - Capsule endoscopy in the cecum. - Three 4 to 10 mm polyps in the transverse colon and in the ascending colon, removed with a cold snare. Resected and retrieved. - One 1 mm polyp in the ascending colon, removed with a cold biopsy forceps. Resected and retrieved. - Two 4 to 6 mm polyps in the descending colon, removed with a cold snare. Resected and retrieved. - Diverticulosis in the sigmoid colon.  - The distal rectum and anal verge are normal on retroflexion view. 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 No dilation required.  -Large hiatal hernia. Otherwise, normal stomach.  - Normal duodenal bulb and second portion of the  duodenum. No bleeding lesion found in the upper GI  tract.  -Given capsule deployed into the duodenum.   Past Medical History:  Diagnosis Date   Anemia    Arthritis    Constipation    Dysphagia 08/16/2016   Esophageal stenosis    GERD (gastroesophageal reflux disease)     Past Surgical History:  Procedure Laterality Date   ABDOMINAL HYSTERECTOMY     APPENDECTOMY     BALLOON DILATION N/A 06/11/2020   Procedure: BALLOON DILATION;  Surgeon: Malissa Hippo,  MD;  [facility];  Service: Endoscopy;  Laterality: N/A;   BIOPSY  04/01/2023   Procedure: BIOPSY;  Surgeon: Marguerita Merles, Reuel Boom, MD;  [facility];  Service: Gastroenterology;;   broken arm and wrist with plate and scews rt wrist Right    CHOLECYSTECTOMY     COLONOSCOPY     COLONOSCOPY WITH PROPOFOL N/A 07/23/2022   Procedure: COLONOSCOPY WITH PROPOFOL;  Surgeon: Dolores Frame, MD;  [facility];  Service: Gastroenterology;  Laterality: N/A;   complete hysterectomy'     fibroid tumors bleeding   ENTEROSCOPY N/A 04/01/2023   Procedure: ENTEROSCOPY;  Surgeon: Dolores Frame, MD;  [facility];  Service: Gastroenterology;  Laterality: N/A;   ESOPHAGEAL DILATION N/A 08/22/2016   Procedure: ESOPHAGEAL DILATION;  Surgeon: Malissa Hippo, MD;  [facility];  Service: Endoscopy;  Laterality: N/A;   ESOPHAGEAL DILATION N/A 09/14/2016   Procedure: ESOPHAGEAL DILATION;  Surgeon: Malissa Hippo, MD;  [facility];  Service: Endoscopy;  Laterality: N/A;   ESOPHAGEAL DILATION N/A 07/26/2017   Procedure: ESOPHAGEAL DILATION;  Surgeon: Malissa Hippo, MD;  [facility];  Service: Endoscopy;  Laterality: N/A;   ESOPHAGEAL DILATION N/A 08/31/2017   Procedure: ESOPHAGEAL DILATION;  Surgeon: Malissa Hippo, MD;  [facility];  Service: Endoscopy;  Laterality: N/A;   ESOPHAGEAL DILATION N/A 10/26/2017   Procedure: ESOPHAGEAL DILATION;  Surgeon: Malissa Hippo, MD;  [facility];  Service: Endoscopy;  Laterality: N/A;   ESOPHAGEAL DILATION N/A 02/07/2018   Procedure: ESOPHAGEAL DILATION;  Surgeon: Malissa Hippo, MD;  [facility];  Service: Endoscopy;  Laterality: N/A;   ESOPHAGEAL DILATION N/A 05/31/2019   Procedure: ESOPHAGEAL DILATION;  Surgeon: Malissa Hippo, MD;  [facility];  Service: Endoscopy;  Laterality: N/A;   ESOPHAGEAL DILATION N/A 06/14/2019    Procedure: ESOPHAGEAL DILATION;  Surgeon: Malissa Hippo, MD;  [facility];  Service: Endoscopy;  Laterality: N/A;   ESOPHAGEAL DILATION N/A 07/04/2019   Procedure: ESOPHAGEAL DILATION;  Surgeon: Malissa Hippo, MD;  [facility];  Service: Endoscopy;  Laterality: N/A;  730   ESOPHAGEAL DILATION N/A 08/01/2019   Procedure: ESOPHAGEAL DILATION;  Surgeon: Malissa Hippo, MD;  [facility];  Service: Endoscopy;  Laterality: N/A;   ESOPHAGEAL DILATION N/A 05/14/2020   Procedure: ESOPHAGEAL DILATION;  Surgeon: Malissa Hippo, MD;  [facility];  Service: Endoscopy;  Laterality: N/A;   ESOPHAGOGASTRODUODENOSCOPY N/A 08/22/2016   Procedure: ESOPHAGOGASTRODUODENOSCOPY (EGD);  Surgeon: Malissa Hippo, MD;  [facility];  Service: Endoscopy;  Laterality: N/A;   ESOPHAGOGASTRODUODENOSCOPY N/A 09/14/2016   Procedure: ESOPHAGOGASTRODUODENOSCOPY (EGD);  Surgeon: Malissa Hippo, MD;  [facility];  Service: Endoscopy;  Laterality: N/A;  1225   ESOPHAGOGASTRODUODENOSCOPY N/A 07/26/2017   Procedure: ESOPHAGOGASTRODUODENOSCOPY (EGD);  Surgeon: Malissa Hippo, MD;  [facility];  Service: Endoscopy;  Laterality: N/A;  1:45   ESOPHAGOGASTRODUODENOSCOPY N/A 08/31/2017   Procedure: ESOPHAGOGASTRODUODENOSCOPY (EGD);  Surgeon: Malissa Hippo, MD;  [facility];  Service: Endoscopy;  Laterality: N/A;   ESOPHAGOGASTRODUODENOSCOPY N/A 10/26/2017   Procedure: ESOPHAGOGASTRODUODENOSCOPY (EGD);  Surgeon: Malissa Hippo, MD;  [facility];  Service: Endoscopy;  Laterality: N/A;  1115   ESOPHAGOGASTRODUODENOSCOPY N/A 02/07/2018   Procedure: ESOPHAGOGASTRODUODENOSCOPY (EGD);  Surgeon: Malissa Hippo, MD;  [facility];  Service: Endoscopy;  Laterality: N/A;  830   ESOPHAGOGASTRODUODENOSCOPY N/A 07/04/2019   Procedure: ESOPHAGOGASTRODUODENOSCOPY (EGD);  Surgeon: Malissa Hippo, MD;  [facility];   Service: Endoscopy;  Laterality: N/A;  730   ESOPHAGOGASTRODUODENOSCOPY N/A 08/01/2019   Procedure: ESOPHAGOGASTRODUODENOSCOPY (EGD);  Surgeon: Malissa Hippo, MD;  [facility];  Service: Endoscopy;  Laterality: N/A;  125   ESOPHAGOGASTRODUODENOSCOPY N/A 05/14/2020   Procedure: ESOPHAGOGASTRODUODENOSCOPY (EGD);  Surgeon: Malissa Hippo, MD;  [facility];  Service: Endoscopy;  Laterality: N/A;  10:30   ESOPHAGOGASTRODUODENOSCOPY N/A 06/11/2020   Procedure: ESOPHAGOGASTRODUODENOSCOPY (EGD);  Surgeon: Malissa Hippo, MD;  [facility];  Service: Endoscopy;  Laterality: N/A;  7:30   ESOPHAGOGASTRODUODENOSCOPY (EGD) WITH PROPOFOL N/A 05/31/2019   Procedure: ESOPHAGOGASTRODUODENOSCOPY (EGD) WITH PROPOFOL;  Surgeon: Malissa Hippo, MD;  [facility];  Service: Endoscopy;  Laterality: N/A;  2:35   ESOPHAGOGASTRODUODENOSCOPY (EGD) WITH PROPOFOL N/A 06/14/2019   Procedure: ESOPHAGOGASTRODUODENOSCOPY (EGD) WITH PROPOFOL;  Surgeon: Malissa Hippo, MD;  [facility];  Service: Endoscopy;  Laterality: N/A;   ESOPHAGOGASTRODUODENOSCOPY (EGD) WITH PROPOFOL N/A 07/21/2022   Procedure: ESOPHAGOGASTRODUODENOSCOPY (EGD) WITH PROPOFOL;  Surgeon: Corbin Ade, MD;  [facility];  Service: Endoscopy;  Laterality: N/A;   FOREIGN BODY REMOVAL  05/31/2019   Procedure: FOREIGN BODY REMOVAL;  Surgeon: Malissa Hippo, MD;  [facility];  Service: Endoscopy;;   GIVENS CAPSULE STUDY N/A 07/21/2022   Procedure: GIVENS CAPSULE STUDY;  Surgeon: Corbin Ade, MD;  [facility];  Service: Endoscopy;  Laterality: N/A;   POLYPECTOMY  07/23/2022   Procedure: POLYPECTOMY;  Surgeon: Marguerita Merles, Reuel Boom, MD;  [facility];  Service: Gastroenterology;;    Current Outpatient Medications  Medication Sig Dispense Refill   Ascorbic Acid (VITAMIN C) 1000 MG tablet Take 1,000 mg by mouth daily.     Cholecalciferol (VITAMIN D) 50  MCG (2000 UT) tablet Take 2,000 Units by mouth daily.     ciprofloxacin (CIPRO) 250 MG tablet Take 250 mg by mouth 2 (two) times daily.     docusate sodium (  COLACE) 100 MG capsule Take 100 mg by mouth at bedtime.     gabapentin (NEURONTIN) 300 MG capsule Take 300 mg by mouth 3 (three) times daily as needed (pain). 2 capsules at bedtime and 2 more during the day as needed     loperamide (IMODIUM A-D) 2 MG tablet Take 2 mg by mouth.     Multiple Vitamin (MULTIVITAMIN WITH MINERALS) TABS tablet Take 1 tablet by mouth daily. Centrum Silver     Iron, Ferrous Sulfate, 325 (65 Fe) MG TABS Take 325 mg by mouth in the morning and at bedtime. (Patient not taking: Reported on 05/04/2023)     pantoprazole (PROTONIX) 40 MG tablet Take 1 tablet (40 mg total) by mouth 2 (two) times daily. (Patient not taking: Reported on 05/04/2023) 30 tablet 3   No current facility-administered medications for this visit.    Allergies as of 05/04/2023 - Review Complete 05/04/2023  Allergen Reaction Noted   Penicillins Rash and Other (See Comments) 08/16/2016    Family History  Problem Relation Age of Onset   Heart Problems Mother    Alzheimer's disease Mother    Hypertension Mother    Prostate cancer Father    Heart attack Father    Heart Problems Sister     Social History   Socioeconomic History   Marital status: Widowed    Spouse name: Not on file   Number of children: Not on file   Years of education: Not on file   Highest education level: Not on file  Occupational History   Not on file  Tobacco Use   Smoking status: Never    Passive exposure: Current   Smokeless tobacco: Never  Vaping Use   Vaping status: Never Used  Substance and Sexual Activity   Alcohol use: No   Drug use: No   Sexual activity: Not Currently  Other Topics Concern   Not on file  Social History Narrative   Not on file   Social Drivers of Health   Financial Resource Strain: Medium Risk (08/10/2020)   Received from [facility], [facility]   Overall Financial Resource Strain (CARDIA)    Difficulty of Paying Living Expenses: Somewhat hard  Food Insecurity: No Food Insecurity (03/31/2023)   Hunger Vital Sign    Worried About Running Out of Food in the Last Year: Never true    Ran Out of Food in the Last Year: Never true  Transportation Needs: No Transportation Needs (03/31/2023)   PRAPARE - Administrator, Civil Service (Medical): No    Lack of Transportation (Non-Medical): No  Physical Activity: Not on file  Stress: Not on file  Social Connections: Not on file    Review of systems General: negative for malaise, night sweats, fever, chills, weight loss Neck: Negative for lumps, goiter, pain and significant neck swelling Resp: Negative for cough, wheezing, dyspnea at rest CV: Negative for chest pain, leg swelling, palpitations, orthopnea GI: denies melena, hematochezia, nausea, vomiting, diarrhea, constipation, dysphagia, odyonophagia, early satiety or unintentional weight loss.  MSK: Negative for joint pain or swelling, back pain, and muscle pain. Derm: Negative for itching or rash Psych: Denies depression, anxiety, memory loss, confusion. No homicidal or suicidal ideation.  Heme: Negative for prolonged bleeding, bruising easily, and swollen nodes. Endocrine: Negative for cold or heat intolerance, polyuria, polydipsia and goiter. Neuro: negative for tremor, gait imbalance, syncope and seizures. The remainder of the review of systems is noncontributory.  Physical Exam: BP (!) 147/82 (  BP Location: Left Arm, Patient Position: Sitting, Cuff Size: Normal)   Pulse 75   Temp 98.1 F (36.7 C) (Oral)   Ht 5\' 5"  (1.651 m)   Wt 176 lb 12.8 oz (80.2 kg)   SpO2 96%   BMI 29.42 kg/m  General:   Alert and oriented. No distress noted. Pleasant and cooperative.  Head:  Normocephalic and atraumatic. Eyes:  Conjuctiva clear without scleral icterus. Mouth:  Oral mucosa pink and moist. Good  dentition. No lesions. Heart: Normal rate and rhythm, s1 and s2 heart sounds present.  Lungs: Clear lung sounds in all lobes. Respirations equal and unlabored. Abdomen:  +BS, soft, non-tender and non-distended. No rebound or guarding. No HSM or masses noted. Derm: No palmar erythema or jaundice Msk:  Symmetrical without gross deformities. Normal posture. Extremities:  Without edema. Neurologic:  Alert and  oriented x4 Psych:  Alert and cooperative. Normal mood and affect.  Invalid input(s): "6 MONTHS"   ASSESSMENT: Valerie Griffin is a 89 y.o. female presenting today for follow up of IDA, H pylori, gastric ulcer  Recent admission for symptomatic IDA in November, patient underwent EGD with findings of gastric ulcer and biopsies positive for H. pylori.  She was advised to take omeprazole 40 mg twice daily and sent quadruple bismuth therapy.  She did have subsequent ED visit thereafter in November for altered mental status and states she was advised then to stop her PPI and her H. pylori regimen however I do not see documentation of this in her chart.  It is unclear how much of her H. pylori regimen that she took though she thinks she only had a couple of days left of medication when she stopped.  At this time recommend she resume omeprazole 40 mg twice daily and schedule EGD to evaluate for healing of gastric ulcer.  She denies any rectal bleeding or melena.  She is receiving iron infusions via hematology and is due for her third 1 tomorrow.  She does note some diarrhea secondary to iron infusions and is taking Imodium with some improvement.  I have asked her to continue with Imodium, she can take up to 30 mL in a 24-hour period.  She should avoid all NSAIDs for now. Indications, risks and benefits of procedure discussed in detail with patient. Patient verbalized understanding and is in agreement to proceed with EGD   PLAN:  Resume omeprazole 40mg  BID  2.  Schedule repeat EGD-ASA 3 3. Proceed  with iron infusions as ordered by hematology 4. Continue to follow with hematology  5. Continue with imodium, up to 30 mL per 24 hours.  All questions were answered, patient verbalized understanding and is in agreement with plan as outlined above.    Follow Up: 3 to 4 months  Lezley Bedgood L. Jeanmarie Hubert, MSN, APRN, AGNP-C Adult-Gerontology Nurse Practitioner [facility]   I have reviewed the note and agree with the APP's assessment as described in this progress note  Katrinka Blazing, MD Gastroenterology and Hepatology [facility]

## 2023-05-05 ENCOUNTER — Inpatient Hospital Stay: Payer: Medicare Other

## 2023-05-05 VITALS — BP 130/70 | HR 61 | Temp 98.0°F | Resp 18

## 2023-05-05 DIAGNOSIS — D649 Anemia, unspecified: Secondary | ICD-10-CM | POA: Diagnosis not present

## 2023-05-05 DIAGNOSIS — D508 Other iron deficiency anemias: Secondary | ICD-10-CM

## 2023-05-05 MED ORDER — CETIRIZINE HCL 10 MG PO TABS
5.0000 mg | ORAL_TABLET | Freq: Once | ORAL | Status: AC
  Administered 2023-05-05: 5 mg via ORAL

## 2023-05-05 MED ORDER — SODIUM CHLORIDE 0.9% FLUSH: 10.0000 mL | Freq: Two times a day (BID) | INTRAVENOUS | Status: DC

## 2023-05-05 MED ORDER — SODIUM CHLORIDE 0.9 % IV SOLN
510.0000 mg | Freq: Once | INTRAVENOUS | Status: AC
  Administered 2023-05-05: 510 mg via INTRAVENOUS
  Filled 2023-05-05: qty 510

## 2023-05-05 MED ORDER — ACETAMINOPHEN 325 MG PO TABS
650.0000 mg | ORAL_TABLET | Freq: Once | ORAL | Status: AC
  Administered 2023-05-05: 650 mg via ORAL
  Filled 2023-05-05: qty 2

## 2023-05-05 MED ORDER — SODIUM CHLORIDE 0.9 % IV SOLN: INTRAVENOUS | Status: DC

## 2023-05-05 NOTE — Progress Notes (Signed)
Patient presents today for Feraheme infusion per providers order.  Vital signs WNL.  Peripheral IV started and blood return noted pre and post infusion.  Stable during infusion without adverse affects.  Vital signs stable.  No complaints at this time.  Discharge from clinic ambulatory in stable condition.  Alert and oriented X 3.  Follow up with [facility] as scheduled.

## 2023-05-05 NOTE — Patient Instructions (Signed)
 [facility] [place] - A DEPT OF [facility]  Discharge Instructions: Thank you for choosing [facility] to provide your oncology and hematology care.  If you have a lab appointment with the Cancer Center - please note that after April 8th, 2024, all labs will be drawn in the cancer center.  You do not have to check in or register with the main entrance as you have in the past but will complete your check-in in the cancer center.  Wear comfortable clothing and clothing appropriate for easy access to any Portacath or PICC line.   We strive to give you quality time with your provider. You may need to reschedule your appointment if you arrive late (15 or more minutes).  Arriving late affects you and other patients whose appointments are after yours.  Also, if you miss three or more appointments without notifying the office, you may be dismissed from the clinic at the provider's discretion.      For prescription refill requests, have your pharmacy contact our office and allow 72 hours for refills to be completed.    Today you received the following chemotherapy and/or immunotherapy agents Feraheme      To help prevent nausea and vomiting after your treatment, we encourage you to take your nausea medication as directed.  BELOW ARE SYMPTOMS THAT SHOULD BE REPORTED IMMEDIATELY: *FEVER GREATER THAN 100.4 F (38 C) OR HIGHER *CHILLS OR SWEATING *NAUSEA AND VOMITING THAT IS NOT CONTROLLED WITH YOUR NAUSEA MEDICATION *UNUSUAL SHORTNESS OF BREATH *UNUSUAL BRUISING OR BLEEDING *URINARY PROBLEMS (pain or burning when urinating, or frequent urination) *BOWEL PROBLEMS (unusual diarrhea, constipation, pain near the anus) TENDERNESS IN MOUTH AND THROAT WITH OR WITHOUT PRESENCE OF ULCERS (sore throat, sores in mouth, or a toothache) UNUSUAL RASH, SWELLING OR PAIN  UNUSUAL VAGINAL DISCHARGE OR ITCHING   Items with * indicate a potential emergency and should be followed up  as soon as possible or go to the Emergency Department if any problems should occur.  Please show the CHEMOTHERAPY ALERT CARD or IMMUNOTHERAPY ALERT CARD at check-in to the Emergency Department and triage nurse.  Should you have questions after your visit or need to cancel or reschedule your appointment, please contact [facility] [place] - A DEPT OF [facility] [phone]  and follow the prompts.  Office hours are 8:00 a.m. to 4:30 p.m. Monday - Friday. Please note that voicemails left after 4:00 p.m. may not be returned until the following business day.  We are closed weekends and major holidays. You have access to a nurse at all times for urgent questions. Please call the main number to the clinic [phone] and follow the prompts.  For any non-urgent questions, you may also contact your provider using MyChart. We now offer e-Visits for anyone 69 and older to request care online for non-urgent symptoms. For details visit [email or web address].   Also download the MyChart app! Go to the app store, search "MyChart", open the app, select [facility], and log in with your MyChart username and password.

## 2023-05-20 ENCOUNTER — Other Ambulatory Visit: Payer: Self-pay

## 2023-05-20 ENCOUNTER — Observation Stay (HOSPITAL_COMMUNITY)
Admission: EM | Admit: 2023-05-20 | Discharge: 2023-05-24 | Disposition: A | Discharge status: Skilled Nursing Facility | Payer: Medicare Other | Attending: Family Medicine | Admitting: Family Medicine

## 2023-05-20 ENCOUNTER — Emergency Department (HOSPITAL_COMMUNITY): Payer: Medicare Other

## 2023-05-20 DIAGNOSIS — K5909 Other constipation: Secondary | ICD-10-CM | POA: Diagnosis not present

## 2023-05-20 DIAGNOSIS — S2249XA Multiple fractures of ribs, unspecified side, initial encounter for closed fracture: Secondary | ICD-10-CM | POA: Diagnosis present

## 2023-05-20 DIAGNOSIS — D631 Anemia in chronic kidney disease: Secondary | ICD-10-CM | POA: Insufficient documentation

## 2023-05-20 DIAGNOSIS — Z7722 Contact with and (suspected) exposure to environmental tobacco smoke (acute) (chronic): Secondary | ICD-10-CM | POA: Insufficient documentation

## 2023-05-20 DIAGNOSIS — N1832 Chronic kidney disease, stage 3b: Secondary | ICD-10-CM | POA: Diagnosis not present

## 2023-05-20 DIAGNOSIS — S2242XA Multiple fractures of ribs, left side, initial encounter for closed fracture: Secondary | ICD-10-CM | POA: Diagnosis not present

## 2023-05-20 DIAGNOSIS — Z79899 Other long term (current) drug therapy: Secondary | ICD-10-CM | POA: Insufficient documentation

## 2023-05-20 DIAGNOSIS — W19XXXA Unspecified fall, initial encounter: Secondary | ICD-10-CM | POA: Insufficient documentation

## 2023-05-20 DIAGNOSIS — R296 Repeated falls: Secondary | ICD-10-CM | POA: Diagnosis present

## 2023-05-20 DIAGNOSIS — R531 Weakness: Secondary | ICD-10-CM | POA: Diagnosis present

## 2023-05-20 DIAGNOSIS — Y92002 Bathroom of unspecified non-institutional (private) residence single-family (private) house as the place of occurrence of the external cause: Secondary | ICD-10-CM | POA: Diagnosis not present

## 2023-05-20 DIAGNOSIS — K219 Gastro-esophageal reflux disease without esophagitis: Secondary | ICD-10-CM | POA: Diagnosis present

## 2023-05-20 LAB — CBC
HCT: 38 % (ref 36.0–46.0)
Hemoglobin: 11.9 g/dL — ABNORMAL LOW (ref 12.0–15.0)
MCH: 29 pg (ref 26.0–34.0)
MCHC: 31.3 g/dL (ref 30.0–36.0)
MCV: 92.7 fL (ref 80.0–100.0)
Platelets: 164 10*3/uL (ref 150–400)
RBC: 4.1 MIL/uL (ref 3.87–5.11)
RDW: 22.9 % — ABNORMAL HIGH (ref 11.5–15.5)
WBC: 6.8 10*3/uL (ref 4.0–10.5)
nRBC: 0 % (ref 0.0–0.2)

## 2023-05-20 LAB — BASIC METABOLIC PANEL
Anion gap: 8 (ref 5–15)
BUN: 17 mg/dL (ref 8–23)
CO2: 28 mmol/L (ref 22–32)
Calcium: 10 mg/dL (ref 8.9–10.3)
Chloride: 102 mmol/L (ref 98–111)
Creatinine, Ser: 1.26 mg/dL — ABNORMAL HIGH (ref 0.44–1.00)
GFR, Estimated: 41 mL/min — ABNORMAL LOW (ref >60)
Glucose, Bld: 130 mg/dL — ABNORMAL HIGH (ref 70–99)
Potassium: 3.8 mmol/L (ref 3.5–5.1)
Sodium: 138 mmol/L (ref 135–145)

## 2023-05-20 MED ORDER — ADULT MULTIVITAMIN W/MINERALS CH
1.0000 | ORAL_TABLET | Freq: Every day | ORAL | Status: DC
  Administered 2023-05-21 – 2023-05-24 (×4): 1 via ORAL
  Filled 2023-05-20 (×4): qty 1

## 2023-05-20 MED ORDER — ONDANSETRON HCL 4 MG PO TABS: 4.0000 mg | ORAL_TABLET | Freq: Four times a day (QID) | ORAL | Status: DC | PRN

## 2023-05-20 MED ORDER — METHOCARBAMOL 500 MG PO TABS
500.0000 mg | ORAL_TABLET | Freq: Three times a day (TID) | ORAL | Status: AC
  Administered 2023-05-20 – 2023-05-22 (×6): 500 mg via ORAL
  Filled 2023-05-20 (×6): qty 1

## 2023-05-20 MED ORDER — FENTANYL CITRATE PF 50 MCG/ML IJ SOSY
50.0000 ug | PREFILLED_SYRINGE | Freq: Once | INTRAMUSCULAR | Status: AC
  Administered 2023-05-20: 50 ug via INTRAVENOUS
  Filled 2023-05-20: qty 1

## 2023-05-20 MED ORDER — SODIUM CHLORIDE 0.9% FLUSH: 3.0000 mL | INTRAVENOUS | Status: DC | PRN

## 2023-05-20 MED ORDER — POLYETHYLENE GLYCOL 3350 17 G PO PACK
17.0000 g | PACK | Freq: Every day | ORAL | Status: DC
  Administered 2023-05-20 – 2023-05-24 (×4): 17 g via ORAL
  Filled 2023-05-20 (×5): qty 1

## 2023-05-20 MED ORDER — OXYCODONE HCL 5 MG PO TABS
5.0000 mg | ORAL_TABLET | ORAL | Status: DC | PRN
  Administered 2023-05-20 – 2023-05-24 (×14): 5 mg via ORAL
  Filled 2023-05-20 (×14): qty 1

## 2023-05-20 MED ORDER — ACETAMINOPHEN 325 MG PO TABS
650.0000 mg | ORAL_TABLET | Freq: Four times a day (QID) | ORAL | Status: DC | PRN
  Administered 2023-05-20 – 2023-05-23 (×3): 650 mg via ORAL
  Filled 2023-05-20 (×3): qty 2

## 2023-05-20 MED ORDER — FERROUS SULFATE 325 (65 FE) MG PO TABS
325.0000 mg | ORAL_TABLET | Freq: Every day | ORAL | Status: DC
  Administered 2023-05-21 – 2023-05-24 (×4): 325 mg via ORAL
  Filled 2023-05-20 (×4): qty 1

## 2023-05-20 MED ORDER — FENTANYL CITRATE PF 50 MCG/ML IJ SOSY
50.0000 ug | PREFILLED_SYRINGE | INTRAMUSCULAR | Status: DC | PRN
  Administered 2023-05-20 – 2023-05-23 (×3): 50 ug via INTRAVENOUS
  Filled 2023-05-20 (×3): qty 1

## 2023-05-20 MED ORDER — SODIUM CHLORIDE 0.9 % IV SOLN
INTRAVENOUS | Status: AC | PRN
Start: 2023-05-20 — End: 2023-05-21

## 2023-05-20 MED ORDER — POLYETHYLENE GLYCOL 3350 17 G PO PACK: 17.0000 g | PACK | Freq: Every day | ORAL | Status: DC | PRN

## 2023-05-20 MED ORDER — GABAPENTIN 300 MG PO CAPS
300.0000 mg | ORAL_CAPSULE | Freq: Two times a day (BID) | ORAL | Status: DC
  Administered 2023-05-20 – 2023-05-24 (×8): 300 mg via ORAL
  Filled 2023-05-20 (×8): qty 1

## 2023-05-20 MED ORDER — SODIUM CHLORIDE 0.9% FLUSH
3.0000 mL | Freq: Two times a day (BID) | INTRAVENOUS | Status: DC
  Administered 2023-05-20 – 2023-05-24 (×9): 3 mL via INTRAVENOUS

## 2023-05-20 MED ORDER — TRAZODONE HCL 50 MG PO TABS
50.0000 mg | ORAL_TABLET | Freq: Every evening | ORAL | Status: DC | PRN
  Administered 2023-05-20: 50 mg via ORAL
  Filled 2023-05-20: qty 1

## 2023-05-20 MED ORDER — BISACODYL 10 MG RE SUPP: 10.0000 mg | Freq: Every day | RECTAL | Status: DC | PRN

## 2023-05-20 MED ORDER — VITAMIN C 500 MG PO TABS
1000.0000 mg | ORAL_TABLET | Freq: Every day | ORAL | Status: DC
  Administered 2023-05-21 – 2023-05-24 (×4): 1000 mg via ORAL
  Filled 2023-05-20 (×4): qty 2

## 2023-05-20 MED ORDER — HEPARIN SODIUM (PORCINE) 5000 UNIT/ML IJ SOLN
5000.0000 [IU] | Freq: Three times a day (TID) | INTRAMUSCULAR | Status: DC
  Administered 2023-05-21 – 2023-05-24 (×10): 5000 [IU] via SUBCUTANEOUS
  Filled 2023-05-20 (×10): qty 1

## 2023-05-20 MED ORDER — ACETAMINOPHEN 650 MG RE SUPP: 650.0000 mg | Freq: Four times a day (QID) | RECTAL | Status: DC | PRN

## 2023-05-20 MED ORDER — ONDANSETRON HCL 4 MG/2ML IJ SOLN
4.0000 mg | Freq: Four times a day (QID) | INTRAMUSCULAR | Status: DC | PRN
  Administered 2023-05-23: 4 mg via INTRAVENOUS
  Filled 2023-05-20: qty 2

## 2023-05-20 MED ORDER — VITAMIN D 25 MCG (1000 UNIT) PO TABS
2000.0000 [IU] | ORAL_TABLET | Freq: Every day | ORAL | Status: DC
  Administered 2023-05-21 – 2023-05-24 (×4): 2000 [IU] via ORAL
  Filled 2023-05-20 (×4): qty 2

## 2023-05-20 MED ORDER — PANTOPRAZOLE SODIUM 40 MG PO TBEC
40.0000 mg | DELAYED_RELEASE_TABLET | Freq: Two times a day (BID) | ORAL | Status: DC
  Administered 2023-05-20 – 2023-05-24 (×8): 40 mg via ORAL
  Filled 2023-05-20 (×8): qty 1

## 2023-05-20 MED ORDER — SODIUM CHLORIDE 0.9% FLUSH
3.0000 mL | Freq: Two times a day (BID) | INTRAVENOUS | Status: DC
  Administered 2023-05-20 – 2023-05-23 (×4): 3 mL via INTRAVENOUS

## 2023-05-20 NOTE — ED Notes (Signed)
Attempted to call report to [facility], busy.

## 2023-05-20 NOTE — H&P (Addendum)
Patient Demographics:    Valerie Griffin, is a 89 y.o. female  MRN: [id number]   DOB - [birth year]  Admit Date - 05/20/2023  Outpatient Primary MD for the patient is Ignatius Specking, MD   Assessment & Plan:   Assessment and Plan:  1)Multiple Closed Lt rib fractures--status post fall with multiple closed fractures involving the left seventh, eighth, ninth and tenth ribs --fall at home prior to admission -Pain control with IV fentanyl, p.o. oxycodone and methocarbamol -PT and OT consult requested -Avoid NSAIDs due to CKD and history of GI bleed  2)CKD stage -3B    creatinine on admission= 1.26 (same as on 04/01/2023) renally adjust medications, avoid nephrotoxic agents / dehydration  / hypotension  3)Chronic Anemia--suspect some degree of anemia of CKD, superimposed on history of iron deficiency anemia -Continue iron supplementation -Hgb currently 11.9 which is higher than prior baseline  4)Chronic Constipation--may worsen with opiates and muscle relaxants -Senokot-S and MiraLAX as ordered  Social--- called and left a voicemail for patient's daughter Mrs Feliberto Harts [phone])  Dispo: The patient is from: Home              Anticipated d/c is to:  Await PT eval              Anticipated d/c date is: 1 day              Patient currently is not medically stable to d/c. Barriers: Not Clinically Stable-    With History of - Reviewed by me  Past Medical History:  Diagnosis Date   Anemia    Arthritis    Constipation    Dysphagia 08/16/2016   Esophageal stenosis    GERD (gastroesophageal reflux disease)       Past Surgical History:  Procedure Laterality Date   ABDOMINAL HYSTERECTOMY     APPENDECTOMY     BALLOON DILATION N/A 06/11/2020   Procedure: BALLOON DILATION;  Surgeon: Malissa Hippo, MD;   [facility];  Service: Endoscopy;  Laterality: N/A;   BIOPSY  04/01/2023   Procedure: BIOPSY;  Surgeon: Marguerita Merles, Reuel Boom, MD;  [facility]: [facility];  Service: Gastroenterology;;   broken arm and wrist with plate and scews rt wrist Right    CHOLECYSTECTOMY     COLONOSCOPY     COLONOSCOPY WITH PROPOFOL N/A 07/23/2022   Procedure: COLONOSCOPY WITH PROPOFOL;  Surgeon: Dolores Frame, MD;  [facility]: [facility];  Service: Gastroenterology;  Laterality: N/A;   complete hysterectomy'     fibroid tumors bleeding   ENTEROSCOPY N/A 04/01/2023   Procedure: ENTEROSCOPY;  Surgeon: Dolores Frame, MD;  [facility];  Service: Gastroenterology;  Laterality: N/A;   ESOPHAGEAL DILATION N/A 08/22/2016   Procedure: ESOPHAGEAL DILATION;  Surgeon: Malissa Hippo, MD;  [facility];  Service: Endoscopy;  Laterality: N/A;   ESOPHAGEAL DILATION N/A 09/14/2016   Procedure: ESOPHAGEAL DILATION;  Surgeon: Malissa Hippo,  MD;  [facility];  Service: Endoscopy;  Laterality: N/A;   ESOPHAGEAL DILATION N/A 07/26/2017   Procedure: ESOPHAGEAL DILATION;  Surgeon: Malissa Hippo, MD;  [facility];  Service: Endoscopy;  Laterality: N/A;   ESOPHAGEAL DILATION N/A 08/31/2017   Procedure: ESOPHAGEAL DILATION;  Surgeon: Malissa Hippo, MD;  [facility];  Service: Endoscopy;  Laterality: N/A;   ESOPHAGEAL DILATION N/A 10/26/2017   Procedure: ESOPHAGEAL DILATION;  Surgeon: Malissa Hippo, MD;  [facility];  Service: Endoscopy;  Laterality: N/A;   ESOPHAGEAL DILATION N/A 02/07/2018   Procedure: ESOPHAGEAL DILATION;  Surgeon: Malissa Hippo, MD;  [facility];  Service: Endoscopy;  Laterality: N/A;   ESOPHAGEAL DILATION N/A 05/31/2019   Procedure: ESOPHAGEAL DILATION;  Surgeon: Malissa Hippo, MD;  [facility];  Service: Endoscopy;  Laterality: N/A;   ESOPHAGEAL DILATION N/A 06/14/2019   Procedure:  ESOPHAGEAL DILATION;  Surgeon: Malissa Hippo, MD;  [facility];  Service: Endoscopy;  Laterality: N/A;   ESOPHAGEAL DILATION N/A 07/04/2019   Procedure: ESOPHAGEAL DILATION;  Surgeon: Malissa Hippo, MD;  [facility];  Service: Endoscopy;  Laterality: N/A;  730   ESOPHAGEAL DILATION N/A 08/01/2019   Procedure: ESOPHAGEAL DILATION;  Surgeon: Malissa Hippo, MD;  [facility];  Service: Endoscopy;  Laterality: N/A;   ESOPHAGEAL DILATION N/A 05/14/2020   Procedure: ESOPHAGEAL DILATION;  Surgeon: Malissa Hippo, MD;  [facility];  Service: Endoscopy;  Laterality: N/A;   ESOPHAGOGASTRODUODENOSCOPY N/A 08/22/2016   Procedure: ESOPHAGOGASTRODUODENOSCOPY (EGD);  Surgeon: Malissa Hippo, MD;  [facility];  Service: Endoscopy;  Laterality: N/A;   ESOPHAGOGASTRODUODENOSCOPY N/A 09/14/2016   Procedure: ESOPHAGOGASTRODUODENOSCOPY (EGD);  Surgeon: Malissa Hippo, MD;  [facility];  Service: Endoscopy;  Laterality: N/A;  1225   ESOPHAGOGASTRODUODENOSCOPY N/A 07/26/2017   Procedure: ESOPHAGOGASTRODUODENOSCOPY (EGD);  Surgeon: Malissa Hippo, MD;  [facility];  Service: Endoscopy;  Laterality: N/A;  1:45   ESOPHAGOGASTRODUODENOSCOPY N/A 08/31/2017   Procedure: ESOPHAGOGASTRODUODENOSCOPY (EGD);  Surgeon: Malissa Hippo, MD;  [facility];  Service: Endoscopy;  Laterality: N/A;   ESOPHAGOGASTRODUODENOSCOPY N/A 10/26/2017   Procedure: ESOPHAGOGASTRODUODENOSCOPY (EGD);  Surgeon: Malissa Hippo, MD;  [facility];  Service: Endoscopy;  Laterality: N/A;  1115   ESOPHAGOGASTRODUODENOSCOPY N/A 02/07/2018   Procedure: ESOPHAGOGASTRODUODENOSCOPY (EGD);  Surgeon: Malissa Hippo, MD;  [facility];  Service: Endoscopy;  Laterality: N/A;  830   ESOPHAGOGASTRODUODENOSCOPY N/A 07/04/2019   Procedure: ESOPHAGOGASTRODUODENOSCOPY (EGD);  Surgeon: Malissa Hippo, MD;  [facility];  Service:  Endoscopy;  Laterality: N/A;  730   ESOPHAGOGASTRODUODENOSCOPY N/A 08/01/2019   Procedure: ESOPHAGOGASTRODUODENOSCOPY (EGD);  Surgeon: Malissa Hippo, MD;  [facility];  Service: Endoscopy;  Laterality: N/A;  125   ESOPHAGOGASTRODUODENOSCOPY N/A 05/14/2020   Procedure: ESOPHAGOGASTRODUODENOSCOPY (EGD);  Surgeon: Malissa Hippo, MD;  [facility];  Service: Endoscopy;  Laterality: N/A;  10:30   ESOPHAGOGASTRODUODENOSCOPY N/A 06/11/2020   Procedure: ESOPHAGOGASTRODUODENOSCOPY (EGD);  Surgeon: Malissa Hippo, MD;  [facility];  Service: Endoscopy;  Laterality: N/A;  7:30   ESOPHAGOGASTRODUODENOSCOPY (EGD) WITH PROPOFOL N/A 05/31/2019   Procedure: ESOPHAGOGASTRODUODENOSCOPY (EGD) WITH PROPOFOL;  Surgeon: Malissa Hippo, MD;  [facility];  Service: Endoscopy;  Laterality: N/A;  2:35   ESOPHAGOGASTRODUODENOSCOPY (EGD) WITH PROPOFOL N/A 06/14/2019   Procedure: ESOPHAGOGASTRODUODENOSCOPY (  EGD) WITH PROPOFOL;  Surgeon: Malissa Hippo, MD;  [facility];  Service: Endoscopy;  Laterality: N/A;   ESOPHAGOGASTRODUODENOSCOPY (EGD) WITH PROPOFOL N/A 07/21/2022   Procedure: ESOPHAGOGASTRODUODENOSCOPY (EGD) WITH PROPOFOL;  Surgeon: Corbin Ade, MD;  [facility];  Service: Endoscopy;  Laterality: N/A;   FOREIGN BODY REMOVAL  05/31/2019   Procedure: FOREIGN BODY REMOVAL;  Surgeon: Malissa Hippo, MD;  [facility];  Service: Endoscopy;;   GIVENS CAPSULE STUDY N/A 07/21/2022   Procedure: GIVENS CAPSULE STUDY;  Surgeon: Corbin Ade, MD;  [facility];  Service: Endoscopy;  Laterality: N/A;   POLYPECTOMY  07/23/2022   Procedure: POLYPECTOMY;  Surgeon: Marguerita Merles, Reuel Boom, MD;  [facility];  Service: Gastroenterology;;    Chief Complaint  Patient presents with   Fall      HPI:    Allaina Vigorito  is a 89 y.o. female with past medical history relevant for CKD 3B, chronic constipation and chronic  anemia with prior history of GI bleed presents to the ED after falling in her bathroom at home on 05/20/2023 -Patient lives with her son at baseline she ambulates with a walker/rollator---currently her knee gave out and she ended up on the floor hitting her left side -Denies dizziness palpitations chest pains or dyspnea prior to falling - -imaging studies in the ED suggest multiple closed fractures involving the left seventh, eighth, ninth and tenth ribs -Lumbar x-rays without acute findings -Left shoulder x-ray suggest rotator cuff disease but no acute findings -Thoracic spine x-ray without acute findings -CBC with WBC of 6.8 hemoglobin 11.9 platelets 164 =-Creatinine 1.26, potassium 3.8 sodium 138    Review of systems:    In addition to the HPI above,   A full Review of  Systems was done, all other systems reviewed are negative except as noted above in HPI , .    Social History:  Reviewed by me    Social History   Tobacco Use   Smoking status: Never    Passive exposure: Current   Smokeless tobacco: Never  Substance Use Topics   Alcohol use: No    Family History :  Reviewed by me    Family History  Problem Relation Age of Onset   Heart Problems Mother    Alzheimer's disease Mother    Hypertension Mother    Prostate cancer Father    Heart attack Father    Heart Problems Sister       Home Medications:   Prior to Admission medications   Medication Sig Start Date End Date Taking? Authorizing Provider  Ascorbic Acid (VITAMIN C) 1000 MG tablet Take 1,000 mg by mouth daily.   Yes [provider]  docusate sodium (COLACE) 100 MG capsule Take 100 mg by mouth at bedtime.   Yes [provider]  gabapentin (NEURONTIN) 300 MG capsule Take 300 mg by mouth 3 (three) times daily as needed (pain). 2 capsules at bedtime and 2 more during the day as needed 10/19/21  Yes [provider]  Iron, Ferrous Sulfate, 325 (65 Fe) MG TABS Take 325 mg by mouth in  the morning and at bedtime. 07/23/22  Yes Standley Brooking, MD  Multiple Vitamin (MULTIVITAMIN WITH MINERALS) TABS tablet Take 1 tablet by mouth daily. Centrum Silver   Yes [provider]  pantoprazole (PROTONIX) 40 MG tablet Take 1 tablet (40 mg total) by mouth 2 (two) times daily. 04/02/23 04/01/24 Yes Vassie Loll, MD  Allergies:     Allergies  Allergen Reactions   Penicillins Rash and Other (See Comments)    40 years ago, caused rash and tachycardia      Physical Exam:   Vitals  Blood pressure 117/80, pulse 73, temperature 98.3 F (36.8 C), resp. rate 18, height 5\' 5"  (1.651 m), weight 80 kg, SpO2 97%.  Physical Examination: General appearance - alert,  in no distress  Mental status - alert, oriented to person, place, and time,  Eyes - sclera anicteric Neck - supple, no JVD elevation , Chest - clear  to auscultation bilaterally, symmetrical air movement, point tenderness on palpation of left rib cage Heart - S1 and S2 normal, regular  Abdomen - soft, nontender, nondistended, +BS Neurological - screening mental status exam normal, neck supple without rigidity, cranial nerves II through XII intact, DTR's normal and symmetric Extremities - no pedal edema noted, intact peripheral pulses  Skin - warm, dry    Data Review:    CBC Recent Labs  Lab 05/20/23 1014  WBC 6.8  HGB 11.9*  HCT 38.0  PLT 164  MCV 92.7  MCH 29.0  MCHC 31.3  RDW 22.9*   ------------------------------------------------------------------------------------------------------------------  Chemistries  Recent Labs  Lab 05/20/23 1014  NA 138  K 3.8  CL 102  CO2 28  GLUCOSE 130*  BUN 17  CREATININE 1.26*  CALCIUM 10.0   ------------------------------------------------------------------------------------------------------------------ estimated creatinine clearance is 32.9 mL/min (A) (by C-G formula based on SCr of 1.26 mg/dL  (H)). ------------------------------------------------------------------------------------------------------------------ ------------------------------------------------------------------------------------------------------------------Urinalysis    Component Value Date/Time   COLORURINE YELLOW 04/17/2023 1905   APPEARANCEUR CLEAR 04/17/2023 1905   LABSPEC 1.016 04/17/2023 1905   PHURINE 6.0 04/17/2023 1905   GLUCOSEU NEGATIVE 04/17/2023 1905   HGBUR SMALL (A) 04/17/2023 1905   BILIRUBINUR NEGATIVE 04/17/2023 1905   KETONESUR 5 (A) 04/17/2023 1905   PROTEINUR 100 (A) 04/17/2023 1905   NITRITE NEGATIVE 04/17/2023 1905   LEUKOCYTESUR NEGATIVE 04/17/2023 1905     Imaging Results:    DG Lumbar Spine Complete Result Date: 05/20/2023 CLINICAL DATA:  Fall and left-sided back pain. EXAM: LUMBAR SPINE - COMPLETE 4+ VIEW COMPARISON:  Thoracic spine 05/20/2023 FINDINGS: S1 appears to be a transitional vertebral body. Approximately 7 mm of anterolisthesis of L5 on S1. Multilevel disc space narrowing and endplate disease in the lumbar spine. Lumbar vertebral body heights appear to be maintained. Degenerative facet arthropathy particularly in lower lumbar spine. Atherosclerotic calcifications involving the abdominal aorta. Questionable mild retrolisthesis of L4 on L5. IMPRESSION: 1. No acute bone abnormality in the lumbar spine. 2. Multilevel degenerative disc disease and facet arthropathy. 3. Anterolisthesis of L5 on S1. Electronically Signed   By: Richarda Overlie M.D.   On: 05/20/2023 09:43   DG Ribs Unilateral W/Chest Left Result Date: 05/20/2023 CLINICAL DATA:  Fall.  Left-sided back pain. EXAM: LEFT RIBS AND CHEST - 3+ VIEW COMPARISON:  Chest radiograph 04/17/2023 FINDINGS: Slightly increased densities at the left costophrenic angle. Prominent interstitial lung markings likely chronic. Heart size is upper limits of normal and stable. Negative for pneumothorax. Displaced fractures involving the left  seventh, eighth and ninth ribs. Left tenth rib fracture is minimally displaced or nondisplaced. IMPRESSION: 1. Fractures involving the left seventh, eighth, ninth and tenth ribs. 2. Negative for pneumothorax. 3. Increased densities at the left costophrenic angle. Findings could represent atelectasis and/or small effusion. Electronically Signed   By: Richarda Overlie M.D.   On: 05/20/2023 09:38   DG Thoracic Spine W/Swimmers Result Date: 05/20/2023 CLINICAL  DATA:  Trauma.  Fall.  Left-sided back pain. EXAM: THORACIC SPINE - 3 VIEWS COMPARISON:  Chest radiograph 04/17/2023 and 03/31/2023 FINDINGS: Thoracic vertebral body heights are maintained. No evidence for an acute compression fracture. Multilevel degenerative endplate disease. IMPRESSION: No acute abnormality in the thoracic spine. Electronically Signed   By: Richarda Overlie M.D.   On: 05/20/2023 09:32   DG Shoulder Left Result Date: 05/20/2023 CLINICAL DATA:  89 year old female with history of trauma from a fall this morning with left shoulder pain. EXAM: LEFT SHOULDER - 2+ VIEW COMPARISON:  No priors. FINDINGS: Three views of the left shoulder demonstrate no acute displaced fracture or dislocation. Humeral head is slightly high riding suggesting underlying rotator cuff disease. Joint space narrowing, subchondral sclerosis, subchondral cyst formation and osteophyte formation is noted in the glenohumeral and acromioclavicular joints, indicative of osteoarthritis. IMPRESSION: 1. No acute radiographic abnormality of the left shoulder. 2. Glenohumeral and acromioclavicular joint osteoarthritis. 3. High-riding humeral head suggesting underlying rotator cuff disease. Electronically Signed   By: Trudie Reed M.D.   On: 05/20/2023 09:29    Radiological Exams on Admission: DG Lumbar Spine Complete Result Date: 05/20/2023 CLINICAL DATA:  Fall and left-sided back pain. EXAM: LUMBAR SPINE - COMPLETE 4+ VIEW COMPARISON:  Thoracic spine 05/20/2023 FINDINGS: S1 appears  to be a transitional vertebral body. Approximately 7 mm of anterolisthesis of L5 on S1. Multilevel disc space narrowing and endplate disease in the lumbar spine. Lumbar vertebral body heights appear to be maintained. Degenerative facet arthropathy particularly in lower lumbar spine. Atherosclerotic calcifications involving the abdominal aorta. Questionable mild retrolisthesis of L4 on L5. IMPRESSION: 1. No acute bone abnormality in the lumbar spine. 2. Multilevel degenerative disc disease and facet arthropathy. 3. Anterolisthesis of L5 on S1. Electronically Signed   By: Richarda Overlie M.D.   On: 05/20/2023 09:43   DG Ribs Unilateral W/Chest Left Result Date: 05/20/2023 CLINICAL DATA:  Fall.  Left-sided back pain. EXAM: LEFT RIBS AND CHEST - 3+ VIEW COMPARISON:  Chest radiograph 04/17/2023 FINDINGS: Slightly increased densities at the left costophrenic angle. Prominent interstitial lung markings likely chronic. Heart size is upper limits of normal and stable. Negative for pneumothorax. Displaced fractures involving the left seventh, eighth and ninth ribs. Left tenth rib fracture is minimally displaced or nondisplaced. IMPRESSION: 1. Fractures involving the left seventh, eighth, ninth and tenth ribs. 2. Negative for pneumothorax. 3. Increased densities at the left costophrenic angle. Findings could represent atelectasis and/or small effusion. Electronically Signed   By: Richarda Overlie M.D.   On: 05/20/2023 09:38   DG Thoracic Spine W/Swimmers Result Date: 05/20/2023 CLINICAL DATA:  Trauma.  Fall.  Left-sided back pain. EXAM: THORACIC SPINE - 3 VIEWS COMPARISON:  Chest radiograph 04/17/2023 and 03/31/2023 FINDINGS: Thoracic vertebral body heights are maintained. No evidence for an acute compression fracture. Multilevel degenerative endplate disease. IMPRESSION: No acute abnormality in the thoracic spine. Electronically Signed   By: Richarda Overlie M.D.   On: 05/20/2023 09:32   DG Shoulder Left Result Date:  05/20/2023 CLINICAL DATA:  89 year old female with history of trauma from a fall this morning with left shoulder pain. EXAM: LEFT SHOULDER - 2+ VIEW COMPARISON:  No priors. FINDINGS: Three views of the left shoulder demonstrate no acute displaced fracture or dislocation. Humeral head is slightly high riding suggesting underlying rotator cuff disease. Joint space narrowing, subchondral sclerosis, subchondral cyst formation and osteophyte formation is noted in the glenohumeral and acromioclavicular joints, indicative of osteoarthritis. IMPRESSION: 1. No acute radiographic abnormality of  the left shoulder. 2. Glenohumeral and acromioclavicular joint osteoarthritis. 3. High-riding humeral head suggesting underlying rotator cuff disease. Electronically Signed   By: Trudie Reed M.D.   On: 05/20/2023 09:29   DVT Prophylaxis -SCD/Heparin AM Labs Ordered, also please review Full Orders  Family Communication: Admission, patients condition and plan of care including tests being ordered have been discussed with the patient and daughter [name] who indicate understanding and agree with the plan   Condition -stable  Shon Hale M.D on 05/20/2023 at 7:02 PM Go to [email or web address] -  for contact info  [facility] - Office  [phone]

## 2023-05-20 NOTE — ED Provider Notes (Signed)
[place] EMERGENCY DEPARTMENT AT [facility] Provider Note   CSN: [id number] Arrival date & time: 05/20/23  7253     History  Chief Complaint  Patient presents with   Marletta Lor    Valerie Griffin is a 89 y.o. female.  HPI   This is an elderly 89 year old female currently living at home with her son who is recently had right sided knee surgery, this patient has a history of anemia and has been told not to take any anticoagulants, she has generalized weakness and walks with a rollator, this morning the son was awakened to his cries of pain from Valerie Griffin and when he found her in the bathroom she was on the bathroom floor where she had fallen when she states that her knee gave out.  She has having pain in her left ribs and her left back and has pain with moving the legs.  She was able to be helped off of the floor by the son who brings her to the hospital.  The patient reports that the pain is quite severe, she denies hitting her head she has no shortness of breath, she has no changes in her speech or her vision.  This occurred a short time ago.  The son did give her a couple of Tylenol prior to arrival which she states has not helped with the pain  Home Medications Prior to Admission medications   Medication Sig Start Date End Date Taking? Authorizing Provider  Ascorbic Acid (VITAMIN C) 1000 MG tablet Take 1,000 mg by mouth daily.    [provider]  Cholecalciferol (VITAMIN D) 50 MCG (2000 UT) tablet Take 2,000 Units by mouth daily.    [provider]  ciprofloxacin (CIPRO) 250 MG tablet Take 250 mg by mouth 2 (two) times daily. 04/17/23   [provider]  docusate sodium (COLACE) 100 MG capsule Take 100 mg by mouth at bedtime.    [provider]  gabapentin (NEURONTIN) 300 MG capsule Take 300 mg by mouth 3 (three) times daily as needed (pain). 2 capsules at bedtime and 2 more during the day as needed 10/19/21   [provider]  Iron,  Ferrous Sulfate, 325 (65 Fe) MG TABS Take 325 mg by mouth in the morning and at bedtime. 07/23/22   Standley Brooking, MD  loperamide (IMODIUM A-D) 2 MG tablet Take 2 mg by mouth.    [provider]  Multiple Vitamin (MULTIVITAMIN WITH MINERALS) TABS tablet Take 1 tablet by mouth daily. Centrum Silver    [provider]  pantoprazole (PROTONIX) 40 MG tablet Take 1 tablet (40 mg total) by mouth 2 (two) times daily. 04/02/23 04/01/24  Vassie Loll, MD      Allergies    Penicillins    Review of Systems   Review of Systems  All other systems reviewed and are negative.   Physical Exam Updated Vital Signs BP (!) 166/62   Pulse 76   Temp 98.7 F (37.1 C) (Oral)   Resp 17   Ht 1.651 m (5\' 5" )   Wt 80 kg   SpO2 98%   BMI 29.35 kg/m  Physical Exam Vitals and nursing note reviewed.  Constitutional:      General: She is not in acute distress.    Appearance: She is well-developed.  HENT:     Head: Normocephalic and atraumatic.     Mouth/Throat:     Pharynx: No oropharyngeal exudate.  Eyes:     General:  No scleral icterus.       Right eye: No discharge.        Left eye: No discharge.     Conjunctiva/sclera: Conjunctivae normal.     Pupils: Pupils are equal, round, and reactive to light.  Neck:     Thyroid: No thyromegaly.     Vascular: No JVD.  Cardiovascular:     Rate and Rhythm: Normal rate and regular rhythm.     Heart sounds: Normal heart sounds. No murmur heard.    No friction rub. No gallop.  Pulmonary:     Effort: Pulmonary effort is normal. No respiratory distress.     Breath sounds: Normal breath sounds. No wheezing or rales.  Chest:     Chest wall: No tenderness.  Abdominal:     General: Bowel sounds are normal. There is no distension.     Palpations: Abdomen is soft. There is no mass.     Tenderness: There is no abdominal tenderness.  Musculoskeletal:        General: Tenderness present. Normal range of motion.     Cervical back: Normal range  of motion and neck supple.     Right lower leg: No edema.     Left lower leg: No edema.     Comments: The patient is able to fully range of motion all 4 extremities though it causes significant back pain when she moves the left leg and shoulder pain when she moves the left arm, there is no deformities and passive range of motion of these extremities is painless except for the left shoulder.  There is no leg length discrepancy, there is tenderness over the lumbar and lower thoracic spines and the ribs on the left side.  There is no obvious bruising when the patient is rolled, there is no signs of deformity or step-offs  Lymphadenopathy:     Cervical: No cervical adenopathy.  Skin:    General: Skin is warm and dry.     Findings: No erythema or rash.  Neurological:     Mental Status: She is alert.     Coordination: Coordination normal.  Psychiatric:        Behavior: Behavior normal.     ED Results / Procedures / Treatments   Labs (all labs ordered are listed, but only abnormal results are displayed) Labs Reviewed  CBC - Abnormal; Notable for the following components:      Result Value   Hemoglobin 11.9 (*)    RDW 22.9 (*)    All other components within normal limits  BASIC METABOLIC PANEL - Abnormal; Notable for the following components:   Glucose, Bld 130 (*)    Creatinine, Ser 1.26 (*)    GFR, Estimated 41 (*)    All other components within normal limits    EKG EKG Interpretation Date/Time:  Saturday May 20 2023 09:00:26 EST Ventricular Rate:  81 PR Interval:  163 QRS Duration:  129 QT Interval:  427 QTC Calculation: 496 R Axis:   -57  Text Interpretation: Ectopic atrial rhythm Ventricular premature complex Probable left atrial enlargement Right bundle branch block Left ventricular hypertrophy since last tracing no significant change Confirmed by Eber Hong ([id number]) on 05/20/2023 10:15:13 AM  Radiology DG Lumbar Spine Complete Result Date: 05/20/2023 CLINICAL  DATA:  Fall and left-sided back pain. EXAM: LUMBAR SPINE - COMPLETE 4+ VIEW COMPARISON:  Thoracic spine 05/20/2023 FINDINGS: S1 appears to be a transitional vertebral body. Approximately 7 mm of anterolisthesis of L5 on S1. Multilevel  disc space narrowing and endplate disease in the lumbar spine. Lumbar vertebral body heights appear to be maintained. Degenerative facet arthropathy particularly in lower lumbar spine. Atherosclerotic calcifications involving the abdominal aorta. Questionable mild retrolisthesis of L4 on L5. IMPRESSION: 1. No acute bone abnormality in the lumbar spine. 2. Multilevel degenerative disc disease and facet arthropathy. 3. Anterolisthesis of L5 on S1. Electronically Signed   By: Richarda Overlie M.D.   On: 05/20/2023 09:43   DG Ribs Unilateral W/Chest Left Result Date: 05/20/2023 CLINICAL DATA:  Fall.  Left-sided back pain. EXAM: LEFT RIBS AND CHEST - 3+ VIEW COMPARISON:  Chest radiograph 04/17/2023 FINDINGS: Slightly increased densities at the left costophrenic angle. Prominent interstitial lung markings likely chronic. Heart size is upper limits of normal and stable. Negative for pneumothorax. Displaced fractures involving the left seventh, eighth and ninth ribs. Left tenth rib fracture is minimally displaced or nondisplaced. IMPRESSION: 1. Fractures involving the left seventh, eighth, ninth and tenth ribs. 2. Negative for pneumothorax. 3. Increased densities at the left costophrenic angle. Findings could represent atelectasis and/or small effusion. Electronically Signed   By: Richarda Overlie M.D.   On: 05/20/2023 09:38   DG Thoracic Spine W/Swimmers Result Date: 05/20/2023 CLINICAL DATA:  Trauma.  Fall.  Left-sided back pain. EXAM: THORACIC SPINE - 3 VIEWS COMPARISON:  Chest radiograph 04/17/2023 and 03/31/2023 FINDINGS: Thoracic vertebral body heights are maintained. No evidence for an acute compression fracture. Multilevel degenerative endplate disease. IMPRESSION: No acute abnormality in  the thoracic spine. Electronically Signed   By: Richarda Overlie M.D.   On: 05/20/2023 09:32   DG Shoulder Left Result Date: 05/20/2023 CLINICAL DATA:  89 year old female with history of trauma from a fall this morning with left shoulder pain. EXAM: LEFT SHOULDER - 2+ VIEW COMPARISON:  No priors. FINDINGS: Three views of the left shoulder demonstrate no acute displaced fracture or dislocation. Humeral head is slightly high riding suggesting underlying rotator cuff disease. Joint space narrowing, subchondral sclerosis, subchondral cyst formation and osteophyte formation is noted in the glenohumeral and acromioclavicular joints, indicative of osteoarthritis. IMPRESSION: 1. No acute radiographic abnormality of the left shoulder. 2. Glenohumeral and acromioclavicular joint osteoarthritis. 3. High-riding humeral head suggesting underlying rotator cuff disease. Electronically Signed   By: Trudie Reed M.D.   On: 05/20/2023 09:29    Procedures Procedures    Medications Ordered in ED Medications  fentaNYL (SUBLIMAZE) injection 50 mcg (50 mcg Intravenous Given 05/20/23 1016)    ED Course/ Medical Decision Making/ A&P                                 Medical Decision Making Amount and/or Complexity of Data Reviewed Labs: ordered. Radiology: ordered. ECG/medicine tests: ordered.  Risk Prescription drug management. Decision regarding hospitalization.    This patient presents to the ED for concern of fall with left-sided rib and back pain, this involves an extensive number of treatment options, and is a complaint that carries with it a high risk of complications and morbidity.  The differential diagnosis includes from her of the spine or the ribs, pneumothorax, unlikely to be abdominal trauma, there is no signs of tenderness to the left upper quadrant or flank, there is no bruising on that side of the body, there is no signs of head injury.   Co morbidities that complicate the patient  evaluation  Elderly, anemia   Additional history obtained:  Additional history obtained from medical record  External records from outside source obtained and reviewed including recent admission to the hospital because of symptomatic anemia The patient underwent a small bowel endoscopy and enteroscopy as well as colonoscopy and upper endoscopy throughout this year.  Upper endoscopy by Dr. Jena Gauss in February 2024 showed that the patient had no bleeding lesions found in the upper GI tract, there was a large hiatal hernia and a noncritical peptic stricture Colonoscopy performed in March 2024 showed that the patient had diverticulosis of the sigmoid colon, a couple of polyps in the descending colon which were removed and resected multiple small polyps in the transverse colon Small bowel endoscopy performed on November 9 approximately 1-1/2 months ago showed that the patient had a superficial esophageal ulcer, no recent bleeding again noted the hiatal hernia 3 nonbleeding cratered gastric ulcers with clean bases were found in the gastric antrum no other evidence of significant pathology, normal duodenum and some erosive gastropathy, recommended to stay off of NSAIDs and continue Prilosec 40 mg twice daily   Lab Tests:  I Ordered, and personally interpreted labs.  The pertinent results include: Benign labs   Imaging Studies ordered:  I ordered imaging studies including x-rays I independently visualized and interpreted imaging which showed multiple rib fractures on the left, no pneumothorax I agree with the radiologist interpretation   Cardiac Monitoring: / EKG:  The patient was maintained on a cardiac monitor.  I personally viewed and interpreted the cardiac monitored which showed an underlying rhythm of: NSR   Consultations Obtained:  I requested consultation with the Dr. Mariea Clonts,  and discussed lab and imaging findings as well as pertinent plan - they recommend: admit to  hospitalist.   Problem List / ED Course / Critical interventions / Medication management  Rib fractures, pain control and O2  I have reviewed the patients home medicines and have made adjustments as needed   Social Determinants of Health:  elderly   Test / Admission - Considered:  Admit.         Final Clinical Impression(s) / ED Diagnoses Final diagnoses:  Closed fracture of multiple ribs of left side, initial encounter    Rx / DC Orders ED Discharge Orders     None         Eber Hong, MD 05/20/23 1126

## 2023-05-20 NOTE — Plan of Care (Signed)

## 2023-05-20 NOTE — ED Triage Notes (Signed)
Pt POV from home c/o fall this am in bathroom, pt fell and hit the toilet and complaining of left sided back pain, and right knee pain.

## 2023-05-21 DIAGNOSIS — R296 Repeated falls: Secondary | ICD-10-CM | POA: Diagnosis not present

## 2023-05-21 MED ORDER — ORAL CARE MOUTH RINSE: 15.0000 mL | OROMUCOSAL | Status: DC | PRN

## 2023-05-21 MED ORDER — DM-GUAIFENESIN ER 30-600 MG PO TB12
1.0000 | ORAL_TABLET | Freq: Two times a day (BID) | ORAL | Status: DC
Start: 2023-05-21 — End: 2023-05-24
  Administered 2023-05-21 – 2023-05-24 (×7): 1 via ORAL
  Filled 2023-05-21 (×7): qty 1

## 2023-05-21 NOTE — TOC Initial Note (Signed)
Transition of Care [facility]) - Initial/Assessment Note    Patient Details  Name: Valerie Griffin MRN: [id number] Date of Birth: 1933-11-23  Transition of Care [facility]) CM/SW Contact:    Larrie Kass, LCSW Phone Number: 05/21/2023, 2:29 PM  Clinical Narrative:                 CSW spoke with pt via phone call to discuss [facility] notice and recommendations for SNF placement. The pt has agreed to placement and would like to stay in the [place] area. CSW explained the process noting pt will need insurance authorization. CSW provided an update to pt's daughter [name]. TOC to follow   Expected Discharge Plan: Skilled Nursing Facility Barriers to Discharge: Continued Medical Work up   Patient Goals and CMS Choice Patient states their goals for this hospitalization and ongoing recovery are:: SNF to get stonger CMS Medicare.gov Compare Post Acute Care list provided to:: Patient Choice offered to / list presented to : Patient, Adult Children      Expected Discharge Plan and Services       Living arrangements for the past 2 months: Single Family Home                                      Prior Living Arrangements/Services Living arrangements for the past 2 months: Single Family Home Lives with:: Self          Need for Family Participation in Patient Care: No (Comment) Care giver support system in place?: Yes (comment) Current home services: DME    Activities of Daily Living   ADL Screening (condition at time of admission) Independently performs ADLs?: No Does the patient have a NEW difficulty with bathing/dressing/toileting/self-feeding that is expected to last >3 days?: Yes (Initiates electronic notice to provider for possible OT consult) Does the patient have a NEW difficulty with getting in/out of bed, walking, or climbing stairs that is expected to last >3 days?: Yes (Initiates electronic notice to provider for possible PT consult) Does the patient have a NEW difficulty  with communication that is expected to last >3 days?: No Is the patient deaf or have difficulty hearing?: No Does the patient have difficulty seeing, even when wearing glasses/contacts?: No Does the patient have difficulty concentrating, remembering, or making decisions?: No  Permission Sought/Granted                  Emotional Assessment Appearance:: Appears stated age Attitude/Demeanor/Rapport: Gracious, Charismatic, Engaged Affect (typically observed): Accepting Orientation: : Oriented to Self, Oriented to Place, Oriented to  Time, Oriented to Situation   Psych Involvement: No (comment)  Admission diagnosis:  Falls [R29.6] Closed fracture of multiple ribs of left side, initial encounter [S22.42XA] Patient Active Problem List   Diagnosis Date Noted   Falls 05/20/2023   Multiple Closed rib fractures--. Fractures involving the left seventh, eighth, ninth and tenth ribs. 05/20/2023   Anemia due to chronic kidney disease 05/20/2023   Stage 3b chronic kidney disease (HCC) 04/02/2023   Acute H. pylori gastric ulcer 04/02/2023   ABLA (acute blood loss anemia) 07/22/2022   Gastrointestinal hemorrhage 07/22/2022   Symptomatic anemia 07/20/2022   IDA (iron deficiency anemia) 05/09/2019   Melena 05/09/2019   Esophageal stricture 10/27/2017   Stricture esophagus 09/06/2017   Esophageal stenosis 06/20/2017   Esophageal dysphagia 08/18/2016   Abnormal esophagram 08/18/2016   GERD (gastroesophageal reflux disease) 08/16/2016   Constipation  08/16/2016   Dysphagia 08/16/2016   PCP:  Ignatius Specking, MD Pharmacy:   [facility] [phone] - [place], [place] - [address] [place] [address] Phone: [phone] Fax: [phone]     Social Drivers of Health (SDOH) Social History: SDOH Screenings   Food Insecurity: No Food Insecurity (05/20/2023)  Housing: Low Risk  (05/20/2023)  Transportation Needs: No Transportation Needs (05/20/2023)  Utilities: Not  At Risk (05/20/2023)  Depression (PHQ2-9): Low Risk  (08/05/2022)  Financial Resource Strain: Medium Risk (08/10/2020)   Received from [facility]  Tobacco Use: Medium Risk (05/04/2023)   SDOH Interventions:     Readmission Risk Interventions     No data to display

## 2023-05-21 NOTE — Plan of Care (Signed)

## 2023-05-21 NOTE — Plan of Care (Signed)
°  Problem: Acute Rehab PT Goals(only PT should resolve) Goal: Pt Will Go Supine/Side To Sit Outcome: Progressing Flowsheets (Taken 05/21/2023 1243) Pt will go Supine/Side to Sit:  with moderate assist  with minimal assist Goal: Patient Will Transfer Sit To/From Stand Outcome: Progressing Flowsheets (Taken 05/21/2023 1243) Patient will transfer sit to/from stand:  with minimal assist  with moderate assist Goal: Pt Will Transfer Bed To Chair/Chair To Bed Outcome: Progressing Flowsheets (Taken 05/21/2023 1243) Pt will Transfer Bed to Chair/Chair to Bed:  with min assist  with mod assist Goal: Pt Will Ambulate Outcome: Progressing Flowsheets (Taken 05/21/2023 1243) Pt will Ambulate:  25 feet  with moderate assist  with rolling walker   12:44 PM, 05/21/23 Ocie Bob, MPT Physical Therapist with [facility] [phone] office [phone] mobile phone

## 2023-05-21 NOTE — Progress Notes (Signed)
PROGRESS NOTE  Valerie Griffin, is a 89 y.o. female, DOB - [birth year], UJW:[id number]  Admit date - 05/20/2023   Admitting Physician Bascom Biel Mariea Clonts, MD  Outpatient Primary MD for the patient is Ignatius Specking, MD  LOS - 0  Chief Complaint  Patient presents with   Fall      Brief Narrative:  89 y.o. female with past medical history relevant for CKD 3B, chronic constipation and chronic anemia with prior history of GI bleed presents to the ED after falling in her bathroom at home on 05/20/2023 --admitted 05/20/2023 with multiple left-sided rib cage fractures and ambulatory dysfunction in the setting of severe left-sided rib cage pain -- Currently awaiting transfer to SNF rehab pending insurance authorization  -Assessment and Plan: 1)Multiple Closed Lt rib fractures--status post fall with multiple closed fractures involving the left seventh, eighth, ninth and tenth ribs --fall at home prior to admission 05/21/23 -Significant left-sided rib cage discomfort with positional change, deep inspiration and transfers -Required significant assistance to get out of bed to chair--due to left-sided rib cage pain -Pain control with IV fentanyl, p.o. oxycodone and methocarbamol -Avoid NSAIDs due to CKD and history of GI bleed   2)CKD stage -3B    creatinine on admission= 1.26 (same as on 04/01/2023) renally adjust medications, avoid nephrotoxic agents / dehydration  / hypotension   3)Chronic Anemia--suspect some degree of anemia of CKD, superimposed on history of iron deficiency anemia -Continue iron supplementation -Hgb currently 11.9 which is higher than prior baseline   4)Chronic Constipation--may worsen with opiates and muscle relaxants -Senokot-S and MiraLAX as ordered   5) ambulatory dysfunction----Significant left-sided rib cage discomfort with positional change, deep inspiration and transfers -Required significant assistance to get out of bed to chair--due to left-sided rib cage  pain -Physical therapy eval appreciated recommends SNF rehab Recurrent falls---PTA pt lived at home  and did very poorly, patient has significant limitations with mobility related ADLs- this patient needs to continue to be monitored in the hospital until a SNF bed is obtained as she is not safe to go home with her current physcical limitations - Currently awaiting transfer to SNF rehab pending insurance authorization   Disposition: The patient is from: Home              Anticipated d/c is to: SNF              Anticipated d/c date is: 1 day              Patient currently is medically stable to d/c. Barriers: Currently awaiting transfer to SNF rehab pending insurance authorization  Code Status :  -  Code Status: Full Code   Family Communication:    (patient is alert, awake and coherent)  Discussed with daughter [name]  DVT Prophylaxis  :   - SCDs  heparin injection 5,000 Units Start: 05/21/23 0600 SCDs Start: 05/20/23 1231 Place TED hose Start: 05/20/23 1231   Lab Results  Component Value Date   PLT 164 05/20/2023   Inpatient Medications  Scheduled Meds:  vitamin C  1,000 mg Oral Daily   cholecalciferol  2,000 Units Oral Daily   dextromethorphan-guaiFENesin  1 tablet Oral BID   ferrous sulfate  325 mg Oral Q breakfast   gabapentin  300 mg Oral BID   heparin  5,000 Units Subcutaneous Q8H   methocarbamol  500 mg Oral TID   multivitamin with minerals  1 tablet Oral Daily   pantoprazole  40 mg Oral BID  polyethylene glycol  17 g Oral Daily   sodium chloride flush  3 mL Intravenous Q12H   sodium chloride flush  3 mL Intravenous Q12H   Continuous Infusions: PRN Meds:.acetaminophen **OR** acetaminophen, bisacodyl, fentaNYL (SUBLIMAZE) injection, ondansetron **OR** ondansetron (ZOFRAN) IV, mouth rinse, oxyCODONE, sodium chloride flush, traZODone   Anti-infectives (From admission, onward)    None      Subjective: [name] today has no fevers, no emesis,  No chest  pain,  - daughter [name] at bedside, questions answered -Significant left-sided rib cage discomfort with positional change, deep inspiration and transfers -Required significant assistance to get out of bed to chair--due to left-sided rib cage pain  Objective: Vitals:   05/20/23 2007 05/21/23 0011 05/21/23 0453 05/21/23 1323  BP: (!) 154/59 (!) 123/49 (!) 133/49 106/68  Pulse: 82 72 73 98  Resp: 16 18 16 18   Temp: 98.1 F (36.7 C) 97.9 F (36.6 C) 97.9 F (36.6 C) 98.8 F (37.1 C)  TempSrc: Oral Oral Oral   SpO2: 95% 96% 95% 92%  Weight:      Height:        Intake/Output Summary (Last 24 hours) at 05/21/2023 1527 Last data filed at 05/21/2023 1300 Gross per 24 hour  Intake 440 ml  Output --  Net 440 ml   Filed Weights   05/20/23 0851  Weight: 80 kg   Physical Exam  Gen:- Awake Alert,  in no apparent distress  HEENT:- [place].AT, No sclera icterus Neck-Supple Neck,No JVD,.  Lungs-  CTAB , fair symmetrical air movement CV- S1, S2 normal, regular  Abd-  +ve B.Sounds, Abd Soft, No tenderness,    Extremity/Skin:- No  edema, pedal pulses present  Psych-affect is appropriate, oriented x3 Neuro-Generalized weakness, no new focal deficits, no tremors  Data Reviewed: I have personally reviewed following labs and imaging studies  CBC: Recent Labs  Lab 05/20/23 1014  WBC 6.8  HGB 11.9*  HCT 38.0  MCV 92.7  PLT 164   Basic Metabolic Panel: Recent Labs  Lab 05/20/23 1014  NA 138  K 3.8  CL 102  CO2 28  GLUCOSE 130*  BUN 17  CREATININE 1.26*  CALCIUM 10.0   GFR: Estimated Creatinine Clearance: 32.9 mL/min (A) (by C-G formula based on SCr of 1.26 mg/dL (H)).  Radiology Studies: DG Lumbar Spine Complete Result Date: 05/20/2023 CLINICAL DATA:  Fall and left-sided back pain. EXAM: LUMBAR SPINE - COMPLETE 4+ VIEW COMPARISON:  Thoracic spine 05/20/2023 FINDINGS: S1 appears to be a transitional vertebral body. Approximately 7 mm of anterolisthesis of L5 on S1.  Multilevel disc space narrowing and endplate disease in the lumbar spine. Lumbar vertebral body heights appear to be maintained. Degenerative facet arthropathy particularly in lower lumbar spine. Atherosclerotic calcifications involving the abdominal aorta. Questionable mild retrolisthesis of L4 on L5. IMPRESSION: 1. No acute bone abnormality in the lumbar spine. 2. Multilevel degenerative disc disease and facet arthropathy. 3. Anterolisthesis of L5 on S1. Electronically Signed   By: Richarda Overlie M.D.   On: 05/20/2023 09:43   DG Ribs Unilateral W/Chest Left Result Date: 05/20/2023 CLINICAL DATA:  Fall.  Left-sided back pain. EXAM: LEFT RIBS AND CHEST - 3+ VIEW COMPARISON:  Chest radiograph 04/17/2023 FINDINGS: Slightly increased densities at the left costophrenic angle. Prominent interstitial lung markings likely chronic. Heart size is upper limits of normal and stable. Negative for pneumothorax. Displaced fractures involving the left seventh, eighth and ninth ribs. Left tenth rib fracture is minimally displaced or nondisplaced. IMPRESSION: 1. Fractures  involving the left seventh, eighth, ninth and tenth ribs. 2. Negative for pneumothorax. 3. Increased densities at the left costophrenic angle. Findings could represent atelectasis and/or small effusion. Electronically Signed   By: Richarda Overlie M.D.   On: 05/20/2023 09:38   DG Thoracic Spine W/Swimmers Result Date: 05/20/2023 CLINICAL DATA:  Trauma.  Fall.  Left-sided back pain. EXAM: THORACIC SPINE - 3 VIEWS COMPARISON:  Chest radiograph 04/17/2023 and 03/31/2023 FINDINGS: Thoracic vertebral body heights are maintained. No evidence for an acute compression fracture. Multilevel degenerative endplate disease. IMPRESSION: No acute abnormality in the thoracic spine. Electronically Signed   By: Richarda Overlie M.D.   On: 05/20/2023 09:32   DG Shoulder Left Result Date: 05/20/2023 CLINICAL DATA:  89 year old female with history of trauma from a fall this morning with  left shoulder pain. EXAM: LEFT SHOULDER - 2+ VIEW COMPARISON:  No priors. FINDINGS: Three views of the left shoulder demonstrate no acute displaced fracture or dislocation. Humeral head is slightly high riding suggesting underlying rotator cuff disease. Joint space narrowing, subchondral sclerosis, subchondral cyst formation and osteophyte formation is noted in the glenohumeral and acromioclavicular joints, indicative of osteoarthritis. IMPRESSION: 1. No acute radiographic abnormality of the left shoulder. 2. Glenohumeral and acromioclavicular joint osteoarthritis. 3. High-riding humeral head suggesting underlying rotator cuff disease. Electronically Signed   By: Trudie Reed M.D.   On: 05/20/2023 09:29   Scheduled Meds:  vitamin C  1,000 mg Oral Daily   cholecalciferol  2,000 Units Oral Daily   dextromethorphan-guaiFENesin  1 tablet Oral BID   ferrous sulfate  325 mg Oral Q breakfast   gabapentin  300 mg Oral BID   heparin  5,000 Units Subcutaneous Q8H   methocarbamol  500 mg Oral TID   multivitamin with minerals  1 tablet Oral Daily   pantoprazole  40 mg Oral BID   polyethylene glycol  17 g Oral Daily   sodium chloride flush  3 mL Intravenous Q12H   sodium chloride flush  3 mL Intravenous Q12H   Continuous Infusions:   LOS: 0 days   Shon Hale M.D on 05/21/2023 at 3:27 PM  Go to [email or web address] - for contact info  [facility] - Office  [phone]  If 7PM-7AM, please contact night-coverage [email or web address] 05/21/2023, 3:27 PM

## 2023-05-21 NOTE — [place] FL2 (Signed)
[place] MEDICAID FL2 LEVEL OF CARE FORM     IDENTIFICATION  Patient Name: Valerie Griffin Birthdate: [birth year] Sex: female Admission Date (Current Location): 05/20/2023  [facility] and [place] Number:  [employer] and Address:  [facility],  [address] [place] [address]      Provider Number: [id number]  Attending Physician Name and Address:  Shon Hale, MD  Relative Name and Phone Number:  Feliberto Harts (Daughter)  [phone] (Work Phone)    Current Level of Care: Hospital Recommended Level of Care: Skilled Nursing Facility Prior Approval Number:    Date Approved/Denied:   PASRR Number:    Discharge Plan: SNF    Current Diagnoses: Patient Active Problem List   Diagnosis Date Noted   Falls 05/20/2023   Multiple Closed rib fractures--. Fractures involving the left seventh, eighth, ninth and tenth ribs. 05/20/2023   Anemia due to chronic kidney disease 05/20/2023   Stage 3b chronic kidney disease (HCC) 04/02/2023   Acute H. pylori gastric ulcer 04/02/2023   ABLA (acute blood loss anemia) 07/22/2022   Gastrointestinal hemorrhage 07/22/2022   Symptomatic anemia 07/20/2022   IDA (iron deficiency anemia) 05/09/2019   Melena 05/09/2019   Esophageal stricture 10/27/2017   Stricture esophagus 09/06/2017   Esophageal stenosis 06/20/2017   Esophageal dysphagia 08/18/2016   Abnormal esophagram 08/18/2016   GERD (gastroesophageal reflux disease) 08/16/2016   Constipation 08/16/2016   Dysphagia 08/16/2016    Orientation RESPIRATION BLADDER Height & Weight     Self, Time, Situation, Place  Normal Incontinent Weight: 176 lb 5.9 oz (80 kg) Height:  5\' 5"  (165.1 cm)  BEHAVIORAL SYMPTOMS/MOOD NEUROLOGICAL BOWEL NUTRITION STATUS      Continent Diet (Heart Healthy)  AMBULATORY STATUS COMMUNICATION OF NEEDS Skin   Limited Assist Verbally Normal                       Personal Care Assistance Level of Assistance  Bathing,  Feeding, Dressing Bathing Assistance: Limited assistance Feeding assistance: Independent Dressing Assistance: Limited assistance     Functional Limitations Info  Sight, Hearing, Speech Sight Info: Adequate Hearing Info: Adequate Speech Info: Adequate    SPECIAL CARE FACTORS FREQUENCY  PT (By licensed PT), OT (By licensed OT)     PT Frequency: 5 x a week OT Frequency: 5 x a week            Contractures Contractures Info: Not present    Additional Factors Info  Code Status, Allergies, Psychotropic Code Status Info: full Allergies Info: Penicillins           Current Medications (05/21/2023):  This is the current hospital active medication list Current Facility-Administered Medications  Medication Dose Route Frequency Provider Last Rate Last Admin   acetaminophen (TYLENOL) tablet 650 mg  650 mg Oral Q6H PRN Emokpae, Courage, MD   650 mg at 05/20/23 2010   Or   acetaminophen (TYLENOL) suppository 650 mg  650 mg Rectal Q6H PRN Emokpae, Courage, MD       ascorbic acid (VITAMIN C) tablet 1,000 mg  1,000 mg Oral Daily Emokpae, Courage, MD   1,000 mg at 05/21/23 0902   bisacodyl (DULCOLAX) suppository 10 mg  10 mg Rectal Daily PRN Emokpae, Courage, MD       cholecalciferol (VITAMIN D3) 25 MCG (1000 UNIT) tablet 2,000 Units  2,000 Units Oral Daily Emokpae, Courage, MD   2,000 Units at 05/21/23 0902   dextromethorphan-guaiFENesin (MUCINEX DM) 30-600 MG per 12 hr tablet  1 tablet  1 tablet Oral BID Shon Hale, MD   1 tablet at 05/21/23 0909   fentaNYL (SUBLIMAZE) injection 50 mcg  50 mcg Intravenous Q4H PRN Shon Hale, MD   50 mcg at 05/21/23 0744   ferrous sulfate tablet 325 mg  325 mg Oral Q breakfast Emokpae, Courage, MD   325 mg at 05/21/23 0740   gabapentin (NEURONTIN) capsule 300 mg  300 mg Oral BID Emokpae, Courage, MD   300 mg at 05/21/23 0902   heparin injection 5,000 Units  5,000 Units Subcutaneous Q8H Emokpae, Courage, MD   5,000 Units at 05/21/23 1327    methocarbamol (ROBAXIN) tablet 500 mg  500 mg Oral TID Shon Hale, MD   500 mg at 05/21/23 0902   multivitamin with minerals tablet 1 tablet  1 tablet Oral Daily Emokpae, Courage, MD   1 tablet at 05/21/23 0902   ondansetron (ZOFRAN) tablet 4 mg  4 mg Oral Q6H PRN Emokpae, Courage, MD       Or   ondansetron (ZOFRAN) injection 4 mg  4 mg Intravenous Q6H PRN Emokpae, Courage, MD       Oral care mouth rinse  15 mL Mouth Rinse PRN Emokpae, Courage, MD       oxyCODONE (Oxy IR/ROXICODONE) immediate release tablet 5 mg  5 mg Oral Q4H PRN Emokpae, Courage, MD   5 mg at 05/21/23 1326   pantoprazole (PROTONIX) EC tablet 40 mg  40 mg Oral BID Emokpae, Courage, MD   40 mg at 05/21/23 0902   polyethylene glycol (MIRALAX / GLYCOLAX) packet 17 g  17 g Oral Daily Emokpae, Courage, MD   17 g at 05/21/23 0902   sodium chloride flush (NS) 0.9 % injection 3 mL  3 mL Intravenous Q12H Emokpae, Courage, MD   3 mL at 05/20/23 2006   sodium chloride flush (NS) 0.9 % injection 3 mL  3 mL Intravenous Q12H Emokpae, Courage, MD   3 mL at 05/21/23 0910   sodium chloride flush (NS) 0.9 % injection 3 mL  3 mL Intravenous PRN Emokpae, Courage, MD       traZODone (DESYREL) tablet 50 mg  50 mg Oral QHS PRN Shon Hale, MD   50 mg at 05/20/23 2005     Discharge Medications: Please see discharge summary for a list of discharge medications.  Relevant Imaging Results:  Relevant Lab Results:   Additional Information SSN:[id number]  [name], LCSW

## 2023-05-21 NOTE — Evaluation (Signed)
Physical Therapy Evaluation Patient Details Name: Valerie Griffin MRN: [id number] DOB: 1933-11-22 Today's Date: 05/21/2023  History of Present Illness  Valerie Griffin  is a 89 y.o. female with past medical history relevant for CKD 3B, chronic constipation and chronic anemia with prior history of GI bleed presents to the ED after falling in her bathroom at home on 05/20/2023  -Patient lives with her son at baseline she ambulates with a walker/rollator---currently her knee gave out and she ended up on the floor hitting her left side  -Denies dizziness palpitations chest pains or dyspnea prior to falling   Clinical Impression  Patient demonstrates slow labored movement for sitting up at bedside requiring frequent rest breaks due to left sided rib cage pain, limited to a few slow labored unsteady side steps before having to sit due to fatigue, BLE weakness and pain.  Patient tolerated sitting up in chair after therapy with her daughter present.  Patient will benefit from continued skilled physical therapy in hospital and recommended venue below to increase strength, balance, endurance for safe ADLs and gait.           If plan is discharge home, recommend the following: A lot of help with bathing/dressing/bathroom;A lot of help with walking and/or transfers;Help with stairs or ramp for entrance;Assistance with cooking/housework   Can travel by private vehicle   No    Equipment Recommendations None recommended by PT  Recommendations for Other Services       Functional Status Assessment Patient has had a recent decline in their functional status and demonstrates the ability to make significant improvements in function in a reasonable and predictable amount of time.     Precautions / Restrictions Precautions Precautions: Fall Restrictions Weight Bearing Restrictions Per Provider Order: No      Mobility  Bed Mobility Overal bed mobility: Needs Assistance Bed Mobility: Supine to Sit      Supine to sit: Mod assist, Max assist     General bed mobility comments: increased time, labored movement, frequent rest breaks due to left rib pain    Transfers Overall transfer level: Needs assistance Equipment used: Rolling walker (2 wheels) Transfers: Sit to/from Stand, Bed to chair/wheelchair/BSC Sit to Stand: Mod assist   Step pivot transfers: Mod assist       General transfer comment: unsteady labored movement    Ambulation/Gait Ambulation/Gait assistance: Mod assist, Max assist Gait Distance (Feet): 4 Feet Assistive device: Rolling walker (2 wheels) Gait Pattern/deviations: Decreased step length - right, Decreased step length - left, Decreased stride length, Trunk flexed Gait velocity: slow     General Gait Details: limited to a few slow labored side steps due to weakness and left sided rib cage pain  Stairs            Wheelchair Mobility     Tilt Bed    Modified Rankin (Stroke Patients Only)       Balance Overall balance assessment: Needs assistance Sitting-balance support: Feet supported, No upper extremity supported Sitting balance-Leahy Scale: Fair Sitting balance - Comments: seated at EOB   Standing balance support: Reliant on assistive device for balance, During functional activity, Bilateral upper extremity supported Standing balance-Leahy Scale: Poor Standing balance comment: using R                             Pertinent Vitals/Pain Pain Assessment Pain Assessment: Faces Faces Pain Scale: Hurts even more Pain Location: left rig cage Pain Descriptors /  Indicators: Grimacing, Discomfort, Sharp, Sore, Guarding Pain Intervention(s): Limited activity within patient's tolerance, Monitored during session, Premedicated before session, Repositioned    Home Living Family/patient expects to be discharged to:: Private residence Living Arrangements: Children Available Help at Discharge: Family;Available PRN/intermittently Type  of Home: House Home Access: Stairs to enter Entrance Stairs-Rails: None Entrance Stairs-Number of Steps: 2   Home Layout: One level;Laundry or work area in [employer] Equipment: Agricultural consultant (2 wheels);Rollator (4 wheels)      Prior Function Prior Level of Function : Independent/Modified Independent;Driving             Mobility Comments: household and short distanced  community ambulator using RW, occasionally drives ADLs Comments: Indpendent for household ADLs, assisted for community by family     Extremity/Trunk Assessment   Upper Extremity Assessment Upper Extremity Assessment: Defer to OT evaluation    Lower Extremity Assessment Lower Extremity Assessment: Generalized weakness    Cervical / Trunk Assessment Cervical / Trunk Assessment: Normal  Communication   Communication Communication: No apparent difficulties Cueing Techniques: Verbal cues;Tactile cues  Cognition Arousal: Alert Behavior During Therapy: WFL for tasks assessed/performed, Anxious Overall Cognitive Status: Within Functional Limits for tasks assessed                                          General Comments      Exercises     Assessment/Plan    PT Assessment Patient needs continued PT services  PT Problem List Decreased strength;Decreased activity tolerance;Decreased balance;Decreased mobility       PT Treatment Interventions DME instruction;Gait training;Stair training;Functional mobility training;Therapeutic activities;Therapeutic exercise;Balance training;Patient/family education    PT Goals (Current goals can be found in the Care Plan section)  Acute Rehab PT Goals Patient Stated Goal: return home after rehab PT Goal Formulation: With patient/family Time For Goal Achievement: 06/04/23 Potential to Achieve Goals: Good    Frequency Min 3X/week     Co-evaluation               AM-PAC PT "6 Clicks" Mobility  Outcome Measure Help needed turning from  your back to your side while in a flat bed without using bedrails?: A Little Help needed moving from lying on your back to sitting on the side of a flat bed without using bedrails?: A Little Help needed moving to and from a bed to a chair (including a wheelchair)?: A Little Help needed standing up from a chair using your arms (e.g., wheelchair or bedside chair)?: A Little Help needed to walk in hospital room?: A Little Help needed climbing 3-5 steps with a railing? : A Lot 6 Click Score: 17    End of Session   Activity Tolerance: Patient tolerated treatment well;Patient limited by fatigue Patient left: in chair;with call bell/phone within reach;with family/visitor present Nurse Communication: Mobility status PT Visit Diagnosis: Unsteadiness on feet (R26.81);Other abnormalities of gait and mobility (R26.89);Muscle weakness (generalized) (M62.81)    Time: 9629-5284 PT Time Calculation (min) (ACUTE ONLY): 31 min   Charges:   PT Evaluation $PT Eval Moderate Complexity: 1 Mod PT Treatments $Therapeutic Activity: 23-37 mins PT General Charges $$ ACUTE PT VISIT: 1 Visit         12:42 PM, 05/21/23 Ocie Bob, MPT Physical Therapist with [facility] 336 [phone] office [phone] mobile phone

## 2023-05-21 NOTE — Care Management Obs Status (Signed)
MEDICARE OBSERVATION STATUS NOTIFICATION   Patient Details  Name: ANNTIONETTE IVANOV MRN: [id number] Date of Birth: [birth year]   Medicare Observation Status Notification Given:  Yes    Larrie Kass, LCSW 05/21/2023, 2:21 PM

## 2023-05-22 DIAGNOSIS — R296 Repeated falls: Secondary | ICD-10-CM | POA: Diagnosis not present

## 2023-05-22 LAB — BASIC METABOLIC PANEL
Anion gap: 8 (ref 5–15)
BUN: 16 mg/dL (ref 8–23)
CO2: 25 mmol/L (ref 22–32)
Calcium: 8.8 mg/dL — ABNORMAL LOW (ref 8.9–10.3)
Chloride: 100 mmol/L (ref 98–111)
Creatinine, Ser: 1.05 mg/dL — ABNORMAL HIGH (ref 0.44–1.00)
GFR, Estimated: 51 mL/min — ABNORMAL LOW (ref >60)
Glucose, Bld: 90 mg/dL (ref 70–99)
Potassium: 3.8 mmol/L (ref 3.5–5.1)
Sodium: 133 mmol/L — ABNORMAL LOW (ref 135–145)

## 2023-05-22 NOTE — Progress Notes (Signed)
PROGRESS NOTE  Valerie Griffin, is a 89 y.o. female, DOB - [birth year], AOZ:[id number]  Admit date - 05/20/2023   Admitting Physician Ahad Colarusso Mariea Clonts, MD  Outpatient Primary MD for the patient is Ignatius Specking, MD  LOS - 0  Chief Complaint  Patient presents with   Fall      Brief Narrative:  89 y.o. female with past medical history relevant for CKD 3B, chronic constipation and chronic anemia with prior history of GI bleed presents to the ED after falling in her bathroom at home on 05/20/2023 --admitted 05/20/2023 with multiple left-sided rib cage fractures and ambulatory dysfunction in the setting of severe left-sided rib cage pain -- Patient is medically stable for transfer to SNF rehab pending insurance authorization  -Assessment and Plan: 1)Multiple Closed Lt rib fractures--status post fall with multiple closed fractures involving the left seventh, eighth, ninth and tenth ribs --fall at home prior to admission 05/22/23 --Left-sided rib cage discomfort with positional change, deep inspiration and transfers -Requiring assistance to get out of bed to chair--due to left-sided rib cage pain -Pain control with IV fentanyl, p.o. oxycodone and methocarbamol -Avoid NSAIDs due to CKD and history of GI bleed   2)CKD stage -3B    creatinine on admission= 1.26 (same as on 04/01/2023) -Creatinine down to 1.0 renally adjust medications, avoid nephrotoxic agents / dehydration  / hypotension   3)Chronic Anemia--suspect some degree of anemia of CKD, superimposed on history of iron deficiency anemia -Continue iron supplementation -Hgb currently 11.9 which is higher than prior baseline   4)Chronic Constipation--may worsen with opiates and muscle relaxants -Senokot-S and MiraLAX as ordered   5)Ambulatory Dysfunction----Significant left-sided rib cage discomfort with positional change, deep inspiration and transfers -Required significant assistance to get out of bed to chair--due to left-sided rib  cage pain -Physical therapy eval appreciated recommends SNF rehab Recurrent falls---PTA pt lived at home  and did very poorly, patient has significant limitations with mobility related ADLs- this patient needs to continue to be monitored in the hospital until a SNF bed is obtained as she is not safe to go home with her current physcical limitations - awaiting transfer to SNF rehab pending insurance authorization   Disposition: The patient is from: Home              Anticipated d/c is to: SNF              Anticipated d/c date is: 1 day              Patient currently is medically stable to d/c. Barriers: - Patient is medically stable for transfer to SNF rehab pending insurance authorization  Code Status :  -  Code Status: Full Code   Family Communication:    (patient is alert, awake and coherent)  Discussed with daughter [name]  DVT Prophylaxis  :   - SCDs  heparin injection 5,000 Units Start: 05/21/23 0600 SCDs Start: 05/20/23 1231 Place TED hose Start: 05/20/23 1231   Lab Results  Component Value Date   PLT 164 05/20/2023   Inpatient Medications  Scheduled Meds:  vitamin C  1,000 mg Oral Daily   cholecalciferol  2,000 Units Oral Daily   dextromethorphan-guaiFENesin  1 tablet Oral BID   ferrous sulfate  325 mg Oral Q breakfast   gabapentin  300 mg Oral BID   heparin  5,000 Units Subcutaneous Q8H   multivitamin with minerals  1 tablet Oral Daily   pantoprazole  40 mg Oral BID   polyethylene  glycol  17 g Oral Daily   sodium chloride flush  3 mL Intravenous Q12H   sodium chloride flush  3 mL Intravenous Q12H   Continuous Infusions: PRN Meds:.acetaminophen **OR** acetaminophen, bisacodyl, fentaNYL (SUBLIMAZE) injection, ondansetron **OR** ondansetron (ZOFRAN) IV, mouth rinse, oxyCODONE, sodium chloride flush, traZODone   Anti-infectives (From admission, onward)    None      Subjective: [name] today has no fevers, no emesis,  No chest pain,  -Left-sided rib cage  pain is not worse -Oral intake is fair - Patient is medically stable for transfer to SNF rehab pending insurance authorization  Objective: Vitals:   05/21/23 1323 05/21/23 2054 05/22/23 0443 05/22/23 1403  BP: 106/68 (!) 142/60 (!) 146/58 (!) 128/57  Pulse: 98 89 80 79  Resp: 18 16 18 17   Temp: 98.8 F (37.1 C) 98.8 F (37.1 C) 97.8 F (36.6 C) 98.6 F (37 C)  TempSrc:  Oral Oral Oral  SpO2: 92% 93% 93% 92%  Weight:      Height:        Intake/Output Summary (Last 24 hours) at 05/22/2023 1807 Last data filed at 05/22/2023 1700 Gross per 24 hour  Intake 120 ml  Output 1575 ml  Net -1455 ml   Filed Weights   05/20/23 0851  Weight: 80 kg   Physical Exam  Gen:- Awake Alert,  in no apparent distress  HEENT:- [place].AT, No sclera icterus Neck-Supple Neck,No JVD,.  Lungs-  CTAB , fair symmetrical air movement, left-sided chest wall/rib cage tenderness with positional change and palpation CV- S1, S2 normal, regular  Abd-  +ve B.Sounds, Abd Soft, No tenderness,    Extremity/Skin:- No  edema, pedal pulses present  Psych-affect is appropriate, oriented x3 Neuro-Generalized weakness, no new focal deficits, no tremors  Data Reviewed: I have personally reviewed following labs and imaging studies  CBC: Recent Labs  Lab 05/20/23 1014  WBC 6.8  HGB 11.9*  HCT 38.0  MCV 92.7  PLT 164   Basic Metabolic Panel: Recent Labs  Lab 05/20/23 1014 05/22/23 0439  NA 138 133*  K 3.8 3.8  CL 102 100  CO2 28 25  GLUCOSE 130* 90  BUN 17 16  CREATININE 1.26* 1.05*  CALCIUM 10.0 8.8*   GFR: Estimated Creatinine Clearance: 39.4 mL/min (A) (by C-G formula based on SCr of 1.05 mg/dL (H)).  Radiology Studies: No results found.  Scheduled Meds:  vitamin C  1,000 mg Oral Daily   cholecalciferol  2,000 Units Oral Daily   dextromethorphan-guaiFENesin  1 tablet Oral BID   ferrous sulfate  325 mg Oral Q breakfast   gabapentin  300 mg Oral BID   heparin  5,000 Units Subcutaneous Q8H    multivitamin with minerals  1 tablet Oral Daily   pantoprazole  40 mg Oral BID   polyethylene glycol  17 g Oral Daily   sodium chloride flush  3 mL Intravenous Q12H   sodium chloride flush  3 mL Intravenous Q12H   Continuous Infusions:   LOS: 0 days   Shon Hale M.D on 05/22/2023 at 6:07 PM  Go to [email or web address] - for contact info  [facility] - Office  [phone]  If 7PM-7AM, please contact night-coverage [email or web address] 05/22/2023, 6:07 PM

## 2023-05-22 NOTE — Plan of Care (Signed)

## 2023-05-23 DIAGNOSIS — R296 Repeated falls: Secondary | ICD-10-CM | POA: Diagnosis not present

## 2023-05-23 MED ORDER — OXYCODONE HCL 5 MG PO TABS: 5.0000 mg | ORAL_TABLET | Freq: Four times a day (QID) | ORAL | 0 refills | Status: AC | PRN

## 2023-05-23 MED ORDER — ACETAMINOPHEN 325 MG PO TABS: 650.0000 mg | ORAL_TABLET | Freq: Four times a day (QID) | ORAL | Status: AC | PRN

## 2023-05-23 MED ORDER — POLYETHYLENE GLYCOL 3350 17 G PO PACK: 17.0000 g | PACK | Freq: Every day | ORAL | 0 refills | Status: DC

## 2023-05-23 MED ORDER — DM-GUAIFENESIN ER 30-600 MG PO TB12: 1.0000 | ORAL_TABLET | Freq: Two times a day (BID) | ORAL | 0 refills | Status: AC

## 2023-05-23 MED ORDER — IRON (FERROUS SULFATE) 325 (65 FE) MG PO TABS: 325.0000 mg | ORAL_TABLET | Freq: Every day | ORAL | 3 refills | Status: AC

## 2023-05-23 MED ORDER — SENNOSIDES-DOCUSATE SODIUM 8.6-50 MG PO TABS: 2.0000 | ORAL_TABLET | Freq: Every day | ORAL | 1 refills | Status: DC

## 2023-05-23 MED ORDER — METHOCARBAMOL 500 MG PO TABS: 500.0000 mg | ORAL_TABLET | Freq: Three times a day (TID) | ORAL | 0 refills | Status: DC | PRN

## 2023-05-23 MED ORDER — GABAPENTIN 300 MG PO CAPS: 300.0000 mg | ORAL_CAPSULE | Freq: Three times a day (TID) | ORAL | 3 refills | Status: AC

## 2023-05-23 NOTE — Discharge Summary (Signed)
 Valerie Griffin, is a 89 y.o. female  DOB [birth year]  MRN [id number].  Admission date:  05/20/2023  Admitting Physician  Rendall Carwin, MD  Discharge Date:  05/23/2023   Primary MD  Rosamond Leta NOVAK, MD  Recommendations for primary care physician for things to follow:  1)Repeat CBC and BMP Blood Tests in 1 week 2)Incentive Spiromentry advised--- 3)Avoid NSAIDs due to CKD and history of GI bleed---- Avoid ibuprofen/Advil/Aleve/Motrin/Goody Powders/Naproxen/BC powders/Meloxicam/Diclofenac/Indomethacin and other Nonsteroidal anti-inflammatory medications as these will make you more likely to bleed and can cause stomach ulcers, can also cause Kidney problems.    Admission Diagnosis  Falls [R29.6] Closed fracture of multiple ribs of left side, initial encounter [S22.42XA]   Discharge Diagnosis  Falls [R29.6] Closed fracture of multiple ribs of left side, initial encounter [S22.42XA]    Principal Problem:   Falls Active Problems:   Multiple Closed rib fractures--. Fractures involving the left seventh, eighth, ninth and tenth ribs.   Anemia due to chronic kidney disease   GERD (gastroesophageal reflux disease)   Stage 3b chronic kidney disease (HCC)      Past Medical History:  Diagnosis Date   Anemia    Arthritis    Constipation    Dysphagia 08/16/2016   Esophageal stenosis    GERD (gastroesophageal reflux disease)     Past Surgical History:  Procedure Laterality Date   ABDOMINAL HYSTERECTOMY     APPENDECTOMY     BALLOON DILATION N/A 06/11/2020   Procedure: BALLOON DILATION;  Surgeon: Golda Claudis PENNER, MD;  [facility];  Service: Endoscopy;  Laterality: N/A;   BIOPSY  04/01/2023   Procedure: BIOPSY;  Surgeon: Eartha Flavors, Toribio, MD;  [facility];  Service: Gastroenterology;;   broken arm and wrist with plate and scews rt wrist Right    CHOLECYSTECTOMY      COLONOSCOPY     COLONOSCOPY WITH PROPOFOL  N/A 07/23/2022   Procedure: COLONOSCOPY WITH PROPOFOL ;  Surgeon: Eartha Flavors Toribio, MD;  [facility];  Service: Gastroenterology;  Laterality: N/A;   complete hysterectomy'     fibroid tumors bleeding   ENTEROSCOPY N/A 04/01/2023   Procedure: ENTEROSCOPY;  Surgeon: Eartha Flavors Toribio, MD;  [facility];  Service: Gastroenterology;  Laterality: N/A;   ESOPHAGEAL DILATION N/A 08/22/2016   Procedure: ESOPHAGEAL DILATION;  Surgeon: Claudis PENNER Golda, MD;  [facility];  Service: Endoscopy;  Laterality: N/A;   ESOPHAGEAL DILATION N/A 09/14/2016   Procedure: ESOPHAGEAL DILATION;  Surgeon: Claudis PENNER Golda, MD;  [facility];  Service: Endoscopy;  Laterality: N/A;   ESOPHAGEAL DILATION N/A 07/26/2017   Procedure: ESOPHAGEAL DILATION;  Surgeon: Golda Claudis PENNER, MD;  [facility];  Service: Endoscopy;  Laterality: N/A;   ESOPHAGEAL DILATION N/A 08/31/2017   Procedure: ESOPHAGEAL DILATION;  Surgeon: Golda Claudis PENNER, MD;  [facility];  Service: Endoscopy;  Laterality: N/A;   ESOPHAGEAL DILATION N/A 10/26/2017   Procedure: ESOPHAGEAL DILATION;  Surgeon: Golda Claudis PENNER, MD;  [facility];  Service: Endoscopy;  Laterality: N/A;   ESOPHAGEAL DILATION N/A 02/07/2018   Procedure: ESOPHAGEAL DILATION;  Surgeon: Golda Claudis PENNER, MD;  [facility];  Service: Endoscopy;  Laterality: N/A;   ESOPHAGEAL DILATION N/A 05/31/2019   Procedure: ESOPHAGEAL DILATION;  Surgeon: Golda Claudis PENNER, MD;  [facility];  Service: Endoscopy;  Laterality: N/A;   ESOPHAGEAL DILATION N/A 06/14/2019   Procedure: ESOPHAGEAL DILATION;  Surgeon: Golda Claudis PENNER, MD;  [facility];  Service: Endoscopy;  Laterality: N/A;   ESOPHAGEAL DILATION N/A 07/04/2019   Procedure: ESOPHAGEAL DILATION;  Surgeon: Golda Claudis PENNER, MD;  [facility];  Service: Endoscopy;  Laterality: N/A;   730   ESOPHAGEAL DILATION N/A 08/01/2019   Procedure: ESOPHAGEAL DILATION;  Surgeon: Golda Claudis PENNER, MD;  [facility];  Service: Endoscopy;  Laterality: N/A;   ESOPHAGEAL DILATION N/A 05/14/2020   Procedure: ESOPHAGEAL DILATION;  Surgeon: Golda Claudis PENNER, MD;  [facility];  Service: Endoscopy;  Laterality: N/A;   ESOPHAGOGASTRODUODENOSCOPY N/A 08/22/2016   Procedure: ESOPHAGOGASTRODUODENOSCOPY (EGD);  Surgeon: Claudis PENNER Golda, MD;  [facility];  Service: Endoscopy;  Laterality: N/A;   ESOPHAGOGASTRODUODENOSCOPY N/A 09/14/2016   Procedure: ESOPHAGOGASTRODUODENOSCOPY (EGD);  Surgeon: Claudis PENNER Golda, MD;  [facility];  Service: Endoscopy;  Laterality: N/A;  1225   ESOPHAGOGASTRODUODENOSCOPY N/A 07/26/2017   Procedure: ESOPHAGOGASTRODUODENOSCOPY (EGD);  Surgeon: Golda Claudis PENNER, MD;  [facility];  Service: Endoscopy;  Laterality: N/A;  1:45   ESOPHAGOGASTRODUODENOSCOPY N/A 08/31/2017   Procedure: ESOPHAGOGASTRODUODENOSCOPY (EGD);  Surgeon: Golda Claudis PENNER, MD;  [facility];  Service: Endoscopy;  Laterality: N/A;   ESOPHAGOGASTRODUODENOSCOPY N/A 10/26/2017   Procedure: ESOPHAGOGASTRODUODENOSCOPY (EGD);  Surgeon: Golda Claudis PENNER, MD;  [facility];  Service: Endoscopy;  Laterality: N/A;  1115   ESOPHAGOGASTRODUODENOSCOPY N/A 02/07/2018   Procedure: ESOPHAGOGASTRODUODENOSCOPY (EGD);  Surgeon: Golda Claudis PENNER, MD;  [facility];  Service: Endoscopy;  Laterality: N/A;  830   ESOPHAGOGASTRODUODENOSCOPY N/A 07/04/2019   Procedure: ESOPHAGOGASTRODUODENOSCOPY (EGD);  Surgeon: Golda Claudis PENNER, MD;  [facility];  Service: Endoscopy;  Laterality: N/A;  730   ESOPHAGOGASTRODUODENOSCOPY N/A 08/01/2019   Procedure: ESOPHAGOGASTRODUODENOSCOPY (EGD);  Surgeon: Golda Claudis PENNER, MD;  [facility];  Service: Endoscopy;  Laterality: N/A;  125   ESOPHAGOGASTRODUODENOSCOPY N/A 05/14/2020   Procedure:  ESOPHAGOGASTRODUODENOSCOPY (EGD);  Surgeon: Golda Claudis PENNER, MD;  [facility];  Service: Endoscopy;  Laterality: N/A;  10:30   ESOPHAGOGASTRODUODENOSCOPY N/A 06/11/2020   Procedure: ESOPHAGOGASTRODUODENOSCOPY (EGD);  Surgeon: Golda Claudis PENNER, MD;  [facility];  Service: Endoscopy;  Laterality: N/A;  7:30   ESOPHAGOGASTRODUODENOSCOPY (EGD) WITH PROPOFOL  N/A 05/31/2019   Procedure: ESOPHAGOGASTRODUODENOSCOPY (EGD) WITH PROPOFOL ;  Surgeon: Golda Claudis PENNER, MD;  [facility];  Service: Endoscopy;  Laterality: N/A;  2:35   ESOPHAGOGASTRODUODENOSCOPY (EGD) WITH PROPOFOL  N/A 06/14/2019   Procedure: ESOPHAGOGASTRODUODENOSCOPY (EGD) WITH PROPOFOL ;  Surgeon: Golda Claudis PENNER, MD;  [facility];  Service: Endoscopy;  Laterality: N/A;   ESOPHAGOGASTRODUODENOSCOPY (EGD) WITH PROPOFOL  N/A 07/21/2022   Procedure: ESOPHAGOGASTRODUODENOSCOPY (EGD) WITH PROPOFOL ;  Surgeon: Shaaron Lamar HERO, MD;  [facility];  Service: Endoscopy;  Laterality: N/A;   FOREIGN BODY REMOVAL  05/31/2019   Procedure: FOREIGN BODY REMOVAL;  Surgeon: Golda Claudis PENNER, MD;  [facility];  Service: Endoscopy;;   GIVENS CAPSULE STUDY N/A 07/21/2022  Procedure: GIVENS CAPSULE STUDY;  Surgeon: Shaaron Lamar HERO, MD;  [facility];  Service: Endoscopy;  Laterality: N/A;   POLYPECTOMY  07/23/2022   Procedure: POLYPECTOMY;  Surgeon: Eartha Flavors, Toribio, MD;  [facility];  Service: Gastroenterology;;    HPI  from the history and physical done on the day of admission:    Ellyson Rarick  is a 89 y.o. female with past medical history relevant for CKD 3B, chronic constipation and chronic anemia with prior history of GI bleed presents to the ED after falling in her bathroom at home on 05/20/2023 -Patient lives with her son at baseline she ambulates with a walker/rollator---currently her knee gave out and she ended up on the floor hitting her left side -Denies  dizziness palpitations chest pains or dyspnea prior to falling - -imaging studies in the ED suggest multiple closed fractures involving the left seventh, eighth, ninth and tenth ribs -Lumbar x-rays without acute findings -Left shoulder x-ray suggest rotator cuff disease but no acute findings -Thoracic spine x-ray without acute findings -CBC with WBC of 6.8 hemoglobin 11.9 platelets 164 =-Creatinine 1.26, potassium 3.8 sodium 138    Hospital Course:    Brief Narrative:  89 y.o. female with past medical history relevant for CKD 3B, chronic constipation and chronic anemia with prior history of GI bleed presents to the ED after falling in her bathroom at home on 05/20/2023 --admitted 05/20/2023 with multiple left-sided rib cage fractures and ambulatory dysfunction in the setting of severe left-sided rib cage pain   -Assessment and Plan: 1)Multiple Closed Lt rib fractures--status post fall with multiple closed fractures involving the left seventh, eighth, ninth and tenth ribs --fall at home prior to admission --Improving Left-sided rib cage discomfort with positional change, with deep inspiration and transfers -Requiring assistance to get out of bed to chair--due to left-sided rib cage pain --Patient was treated with IV fentanyl , p.o. oxycodone  and methocarbamol  -Pain control improving, -Okay to discharge on as needed oxycodone  and methocarbamol  -Avoid NSAIDs due to CKD and history of GI bleed   2)CKD stage -3B    creatinine on admission= 1.26 (same as on 04/01/2023) -Creatinine down to 1.0 -Avoid NSAIDs renally adjust medications, avoid nephrotoxic agents / dehydration  / hypotension   3)Chronic Anemia--suspect some degree of anemia of CKD, superimposed on history of iron  deficiency anemia -Continue iron  supplementation -Avoid NSAIDs -Hgb currently 11.9 which is higher than prior baseline   4)Chronic Constipation--may worsen with opiates and muscle relaxants -Senokot-S and MiraLAX  as  ordered   5)Ambulatory Dysfunction----Left-sided rib cage discomfort with positional change, deep inspiration and transfers -Required assistance to get out of bed to chair--due to left-sided rib cage pain - transfer to SNF rehab      Disposition: The patient is from: Home              Anticipated d/c is to: SNF  Discharge Condition: stable  Follow UP   Follow-up Information     Vyas, Dhruv B, MD. Schedule an appointment as soon as possible for a visit in 1 month(s).   Specialty: Internal Medicine Contact information: [address] [place] [address] [phone]                 Diet and Activity recommendation:  As advised  Discharge Instructions    Discharge Instructions     Call MD for:  difficulty breathing, headache or visual disturbances   Complete by: As directed    Call MD for:  persistant dizziness or light-headedness   Complete by: As directed    Call MD for:  persistant nausea and vomiting   Complete by: As directed    Call MD for:  temperature >100.4   Complete by: As directed    Diet - low sodium heart healthy   Complete by: As directed    Discharge instructions   Complete by: As directed    1)Repeat CBC and BMP Blood Tests in 1 week 2)Incentive Spiromentry advised--- 3)Avoid NSAIDs due to CKD and history of GI bleed---- Avoid ibuprofen/Advil/Aleve/Motrin/Goody Powders/Naproxen/BC powders/Meloxicam/Diclofenac/Indomethacin and other Nonsteroidal anti-inflammatory medications as these will make you more likely to bleed and can cause stomach ulcers, can also cause Kidney problems.   Increase activity slowly   Complete by: As directed        Discharge Medications     Allergies as of 05/23/2023       Reactions   Penicillins Rash, Other (See Comments)   40 years ago, caused rash and tachycardia        Medication List     STOP taking these medications    docusate sodium  100 MG capsule Commonly known as: COLACE   vitamin C  1000 MG  tablet       TAKE these medications    acetaminophen  325 MG tablet Commonly known as: TYLENOL  Take 2 tablets (650 mg total) by mouth every 6 (six) hours as needed for mild pain (pain score 1-3) (or Fever >/= 101).   dextromethorphan -guaiFENesin  30-600 MG 12hr tablet Commonly known as: MUCINEX  DM Take 1 tablet by mouth 2 (two) times daily for 10 days.   gabapentin  300 MG capsule Commonly known as: NEURONTIN  Take 1 capsule (300 mg total) by mouth 3 (three) times daily. 2 capsules at bedtime and 2 more during the day as needed What changed:  when to take this reasons to take this   Iron  (Ferrous Sulfate ) 325 (65 Fe) MG Tabs Take 325 mg by mouth daily with breakfast. What changed: when to take this   methocarbamol  500 MG tablet Commonly known as: ROBAXIN  Take 1 tablet (500 mg total) by mouth every 8 (eight) hours as needed for muscle spasms.   multivitamin with minerals Tabs tablet Take 1 tablet by mouth daily. Centrum Silver   oxyCODONE  5 MG immediate release tablet Commonly known as: Oxy IR/ROXICODONE  Take 1 tablet (5 mg total) by mouth every 6 (six) hours as needed for moderate pain (pain score 4-6).   pantoprazole  40 MG tablet Commonly known as: Protonix  Take 1 tablet (40 mg total) by mouth 2 (two) times daily.   polyethylene glycol 17 g packet Commonly known as: MIRALAX  / GLYCOLAX  Take 17 g by mouth daily. Start taking on: May 24, 2023   senna-docusate 8.6-50 MG tablet Commonly known as: Senokot-S Take 2 tablets by mouth at bedtime.       Major procedures and Radiology Reports - PLEASE review detailed and final reports for all details, in brief -   DG Lumbar Spine Complete Result Date: 05/20/2023 CLINICAL DATA:  Fall and left-sided back pain. EXAM: LUMBAR SPINE - COMPLETE 4+ VIEW COMPARISON:  Thoracic spine 05/20/2023 FINDINGS: S1 appears to be a transitional vertebral body. Approximately 7 mm of anterolisthesis of L5 on S1. Multilevel disc space narrowing  and endplate disease in the lumbar spine. Lumbar vertebral body heights appear to be maintained. Degenerative facet arthropathy particularly in lower lumbar spine. Atherosclerotic calcifications involving the abdominal aorta. Questionable mild retrolisthesis of L4 on L5. IMPRESSION: 1. No  acute bone abnormality in the lumbar spine. 2. Multilevel degenerative disc disease and facet arthropathy. 3. Anterolisthesis of L5 on S1. Electronically Signed   By: Juliene Balder M.D.   On: 05/20/2023 09:43   DG Ribs Unilateral W/Chest Left Result Date: 05/20/2023 CLINICAL DATA:  Fall.  Left-sided back pain. EXAM: LEFT RIBS AND CHEST - 3+ VIEW COMPARISON:  Chest radiograph 04/17/2023 FINDINGS: Slightly increased densities at the left costophrenic angle. Prominent interstitial lung markings likely chronic. Heart size is upper limits of normal and stable. Negative for pneumothorax. Displaced fractures involving the left seventh, eighth and ninth ribs. Left tenth rib fracture is minimally displaced or nondisplaced. IMPRESSION: 1. Fractures involving the left seventh, eighth, ninth and tenth ribs. 2. Negative for pneumothorax. 3. Increased densities at the left costophrenic angle. Findings could represent atelectasis and/or small effusion. Electronically Signed   By: Juliene Balder M.D.   On: 05/20/2023 09:38   DG Thoracic Spine W/Swimmers Result Date: 05/20/2023 CLINICAL DATA:  Trauma.  Fall.  Left-sided back pain. EXAM: THORACIC SPINE - 3 VIEWS COMPARISON:  Chest radiograph 04/17/2023 and 03/31/2023 FINDINGS: Thoracic vertebral body heights are maintained. No evidence for an acute compression fracture. Multilevel degenerative endplate disease. IMPRESSION: No acute abnormality in the thoracic spine. Electronically Signed   By: Juliene Balder M.D.   On: 05/20/2023 09:32   DG Shoulder Left Result Date: 05/20/2023 CLINICAL DATA:  89 year old female with history of trauma from a fall this morning with left shoulder pain. EXAM: LEFT  SHOULDER - 2+ VIEW COMPARISON:  No priors. FINDINGS: Three views of the left shoulder demonstrate no acute displaced fracture or dislocation. Humeral head is slightly high riding suggesting underlying rotator cuff disease. Joint space narrowing, subchondral sclerosis, subchondral cyst formation and osteophyte formation is noted in the glenohumeral and acromioclavicular joints, indicative of osteoarthritis. IMPRESSION: 1. No acute radiographic abnormality of the left shoulder. 2. Glenohumeral and acromioclavicular joint osteoarthritis. 3. High-riding humeral head suggesting underlying rotator cuff disease. Electronically Signed   By: Toribio Aye M.D.   On: 05/20/2023 09:29   Today   Subjective    Nastashia Gallo today has no new complaints  No fever  Or chills  No Nausea, Vomiting or Diarrhea Occupational therapist at bedside -Left-sided rib cage discomfort improving -Able to do incentive spirometry -Hypoxia resolving--weaned off oxygen   Patient has been seen and examined prior to discharge   Objective   Blood pressure 139/72, pulse 87, temperature 98 F (36.7 C), resp. rate 19, height 5' 5 (1.651 m), weight 80 kg, SpO2 96%.   Intake/Output Summary (Last 24 hours) at 05/23/2023 1017 Last data filed at 05/23/2023 0300 Gross per 24 hour  Intake 360 ml  Output 2000 ml  Net -1640 ml   Exam Gen:- Awake Alert, no acute distress  HEENT:- [place].AT, No sclera icterus Neck-Supple Neck,No JVD,.  Lungs-improving air movement on the left side, improving left-sided chest wall/rib cage tenderness with positional change and palpation  -CV- S1, S2 normal, regular Abd-  +ve B.Sounds, Abd Soft, No tenderness,    Extremity/Skin:- No  edema,   good pulses Psych-affect is appropriate, oriented x3 Neuro-no new focal deficits, no tremors    Data Review   CBC w Diff:  Lab Results  Component Value Date   WBC 6.8 05/20/2023   HGB 11.9 (L) 05/20/2023   HCT 38.0 05/20/2023   PLT 164 05/20/2023    LYMPHOPCT 21 03/31/2023   MONOPCT 6 03/31/2023   EOSPCT 3 03/31/2023   BASOPCT 0  03/31/2023   CMP:  Lab Results  Component Value Date   NA 133 (L) 05/22/2023   K 3.8 05/22/2023   CL 100 05/22/2023   CO2 25 05/22/2023   BUN 16 05/22/2023   CREATININE 1.05 (H) 05/22/2023   CREATININE 1.02 (H) 05/09/2019   PROT 6.4 (L) 03/31/2023   ALBUMIN 3.6 03/31/2023   BILITOT 0.3 03/31/2023   ALKPHOS 51 03/31/2023   AST 20 03/31/2023   ALT 15 03/31/2023   Total Discharge time is about 33 minutes  Rendall Carwin M.D on 05/23/2023 at 10:17 AM  Go to [email or web address] -  for contact info  [facility] - Office  [phone]

## 2023-05-23 NOTE — Progress Notes (Signed)
Report called to [facility] - Rolin Barry.

## 2023-05-23 NOTE — Plan of Care (Signed)
  Problem: Acute Rehab OT Goals (only OT should resolve) Goal: Pt. Will Perform Upper Body Bathing Flowsheets (Taken 05/23/2023 0936) Pt Will Perform Upper Body Bathing:  with min assist  sitting Goal: Pt. Will Perform Lower Body Dressing Flowsheets (Taken 05/23/2023 0936) Pt Will Perform Lower Body Dressing:  with min assist  sitting/lateral leans Goal: Pt. Will Transfer To Toilet Flowsheets (Taken 05/23/2023 [phone]) Pt Will Transfer to Toilet:  with min assist  grab bars   Chiquita Sermon, OTR/L

## 2023-05-23 NOTE — TOC Transition Note (Signed)
 Transition of Care [facility]) - Discharge Note   Patient Details  Name: Valerie Griffin MRN: [id number] Date of Birth: 09-03-1933  Transition of Care [facility]) CM/SW Contact:  Noreen KATHEE Pinal, LCSWA Phone Number: 05/23/2023, 9:20 AM   Clinical Narrative:    CSW spoke with daughter this morning and told her that patient insurance Auth came in the morning and that pt is ready to DC today to [facility]. CSW spoke with [place] with [facility] afterwards and provided Auth #. [place] shared that pt room number is 210-A and number to call for report is [phone]. Nurse provided with info. Med necessity form completed and will call [facility] once ready. TOC signing off.   Final next level of care: Skilled Nursing Facility [facility]) Barriers to Discharge: Barriers Resolved   Patient Goals and CMS Choice Patient states their goals for this hospitalization and ongoing recovery are:: DC to SNF CMS Medicare.gov Compare Post Acute Care list provided to:: Patient Choice offered to / list presented to : Adult Children      Discharge Placement              Patient [facility] at: [facility] Patient to be transferred to facility by: Ambulance Name of family member notified: Joen Carol - daughter [phone] Patient and family notified of of transfer: 05/23/23  Discharge Plan and Services Additional resources added to the After Visit Summary for      DME Arranged: N/A DME Agency: NA      Social Drivers of Health (SDOH) Interventions SDOH Screenings   Food Insecurity: No Food Insecurity (05/20/2023)  Housing: Low Risk  (05/20/2023)  Transportation Needs: No Transportation Needs (05/20/2023)  Utilities: Not At Risk (05/20/2023)  Depression (PHQ2-9): Low Risk  (08/05/2022)  Financial Resource Strain: Medium Risk (08/10/2020)   [facility] from [facility]  Social Connections: Moderately Isolated  (05/23/2023)  Tobacco Use: Medium Risk (05/04/2023)     Readmission Risk Interventions     No data to display

## 2023-05-23 NOTE — Plan of Care (Signed)

## 2023-05-23 NOTE — Discharge Instructions (Addendum)
 1)Repeat CBC and BMP Blood Tests in 1 week 2)Incentive Spiromentry advised--- 3)Avoid NSAIDs due to CKD and history of GI bleed---- Avoid ibuprofen/Advil/Aleve/Motrin/Goody Powders/Naproxen/BC powders/Meloxicam/Diclofenac/Indomethacin and other Nonsteroidal anti-inflammatory medications as these will make you more likely to bleed and can cause stomach ulcers, can also cause Kidney problems.

## 2023-05-23 NOTE — Evaluation (Signed)
 Occupational Therapy Evaluation Patient Details Name: Valerie Griffin MRN: [id number] DOB: 02-15-1934 Today's Date: 05/23/2023   History of Present Illness Valerie Griffin  is a 89 y.o. female with past medical history relevant for CKD 3B, chronic constipation and chronic anemia with prior history of GI bleed presents to the ED after falling in her bathroom at home on 05/20/2023  -Patient lives with her son at baseline she ambulates with a walker/rollator---currently her knee gave out and she ended up on the floor hitting her left side  -Denies dizziness palpitations chest pains or dyspnea prior to falling   Clinical Impression   Pt agreeable to OT evaluation. She reports pain in ribcage and back. Pt able to complete bed mobility going from supine to sitting EOB with mod/max assist and slow labored movement due to pain. Pt declined OOB mobility at this time due to pain. Pt able to wash face in bed with set up. Noted BL UE weakness. Pt would continue to benefit from OT in acute setting. D/c recommendations below.      If plan is discharge home, recommend the following: A lot of help with walking and/or transfers;A lot of help with bathing/dressing/bathroom;Assistance with cooking/housework;Assist for transportation;Help with stairs or ramp for entrance    Functional Status Assessment  Patient has had a recent decline in their functional status and demonstrates the ability to make significant improvements in function in a reasonable and predictable amount of time.  Equipment Recommendations  BSC/3in1       Precautions / Restrictions Precautions Precautions: Fall Restrictions Weight Bearing Restrictions Per Provider Order: No      Mobility Bed Mobility Overal bed mobility: Needs Assistance Bed Mobility: Supine to Sit     Supine to sit: Mod assist, Max assist     General bed mobility comments: increased time, labored movement, frequent rest breaks due to left rib pain     Transfers Overall transfer level: Needs assistance Equipment used: Rolling walker (2 wheels)   Sit to Stand: Mod assist           General transfer comment: pt declined OOB mobility at this time due to pain- clinical judgement used for assist level      Balance Overall balance assessment: Needs assistance Sitting-balance support: Bilateral upper extremity supported, Feet supported Sitting balance-Leahy Scale: Fair Sitting balance - Comments: seated at EOB                                   ADL either performed or assessed with clinical judgement   ADL Overall ADL's : Needs assistance/impaired Eating/Feeding: Set up;Sitting   Grooming: Set up;Bed level Grooming Details (indicate cue type and reason): pt washing face with set up Upper Body Bathing: Moderate assistance;Sitting   Lower Body Bathing: Moderate assistance;Sitting/lateral leans   Upper Body Dressing : Moderate assistance;Sitting   Lower Body Dressing: Moderate assistance;Sitting/lateral leans   Toilet Transfer: Moderate assistance;BSC/3in1;Rolling walker (2 wheels)   Toileting- Clothing Manipulation and Hygiene: Moderate assistance;Sitting/lateral lean   Tub/ Shower Transfer: Moderate assistance;Tub bench;Rolling walker (2 wheels)   Functional mobility during ADLs: Moderate assistance;Rolling walker (2 wheels)       Vision Baseline Vision/History: 1 Wears glasses Ability to See in Adequate Light: 0 Adequate Patient Visual Report: No change from baseline Vision Assessment?: No apparent visual deficits            Pertinent Vitals/Pain Pain Assessment Pain Assessment: Faces Faces Pain  Scale: Hurts whole lot Pain Location: left rig cage, back Pain Descriptors / Indicators: Grimacing, Discomfort, Sharp, Sore, Guarding Pain Intervention(s): Limited activity within patient's tolerance, Monitored during session     Extremity/Trunk Assessment Upper Extremity Assessment Upper Extremity  Assessment: Generalized weakness   Lower Extremity Assessment Lower Extremity Assessment: Defer to PT evaluation   Cervical / Trunk Assessment Cervical / Trunk Assessment: Normal   Communication Communication Communication: No apparent difficulties   Cognition Arousal: Alert Behavior During Therapy: WFL for tasks assessed/performed, Anxious Overall Cognitive Status: Within Functional Limits for tasks assessed                                                        Home Living Family/patient expects to be discharged to:: Private residence Living Arrangements: Children Available Help at Discharge: Family;Available PRN/intermittently Type of Home: House Home Access: Stairs to enter [employer] of Steps: 2 Entrance Stairs-Rails: None Home Layout: One level;Laundry or work area in basement     [employer] Shower/Tub: Chief Strategy Officer: Standard Bathroom Accessibility: Yes How Accessible: Accessible via walker Home Equipment: Agricultural Consultant (2 wheels);Rollator (4 wheels)          Prior Functioning/Environment Prior Level of Function : Independent/Modified Independent;Driving             Mobility Comments: household and short distanced  community ambulator using RW, occasionally drives ADLs Comments: Indpendent for household ADLs, assisted for community by family        OT Problem List: Decreased strength;Decreased activity tolerance      OT Treatment/Interventions: Self-care/ADL training;Therapeutic exercise;Therapeutic activities;DME and/or AE instruction;Patient/family education    OT Goals(Current goals can be found in the care plan section) Acute Rehab OT Goals Patient Stated Goal: to go to rehab OT Goal Formulation: With patient Time For Goal Achievement: 06/06/23 Potential to Achieve Goals: Fair ADL Goals Pt Will Perform Upper Body Bathing: with min assist;sitting Pt Will Perform Lower Body Dressing: with  min assist;sitting/lateral leans Pt Will Transfer to Toilet: with min assist;grab bars  OT Frequency: Min 2X/week       AM-PAC OT 6 Clicks Daily Activity     Outcome Measure Help from another person eating meals?: A Little Help from another person taking care of personal grooming?: A Little Help from another person toileting, which includes using toliet, bedpan, or urinal?: A Lot Help from another person bathing (including washing, rinsing, drying)?: A Lot Help from another person to put on and taking off regular upper body clothing?: A Lot Help from another person to put on and taking off regular lower body clothing?: A Lot 6 Click Score: 14   End of Session    Activity Tolerance: Patient tolerated treatment well Patient left: in bed;with call bell/phone within reach  OT Visit Diagnosis: Muscle weakness (generalized) (M62.81)                Time: 9096-9074 OT Time Calculation (min): 22 min Charges:  OT General Charges $OT Visit: 1 Visit OT Evaluation $OT Eval Low Complexity: 1 Low OT Treatments $Self Care/Home Management : 8-22 mins   Chiquita LOISE Sermon, OTR/L 05/23/2023, 9:37 AM

## 2023-05-24 DIAGNOSIS — R296 Repeated falls: Secondary | ICD-10-CM | POA: Diagnosis not present

## 2023-05-24 NOTE — Progress Notes (Signed)
 Mobility Specialist Progress Note:    05/24/23 1431  Mobility  Activity  (heel slides, leg raises)  Level of Assistance Independent after set-up  Assistive Device None  Range of Motion/Exercises Active;Right leg;Left leg  Activity Response Tolerated well  Mobility Referral Yes  Mobility visit 1 Mobility  Mobility Specialist Start Time (ACUTE ONLY) 1345  Mobility Specialist Stop Time (ACUTE ONLY) 1410  Mobility Specialist Time Calculation (min) (ACUTE ONLY) 25 min   Pt received in bed, agreeable to bed mobility. Independently able to perform heel slides and leg raises, 5x. Tolerated well, c/o rib pain. Left pt supine, alarm on. All needs met.   Sherrilee Ditty Mobility Specialist Please contact via Special Educational Needs Teacher or  Rehab office at [phone]

## 2023-05-24 NOTE — TOC Progression Note (Signed)
 Transition of Care [facility]) - Progression Note    Patient Details  Name: Valerie Griffin MRN: [id number] Date of Birth: 1933-11-21  Transition [facility]) CM/SW Contact  Noreen KATHEE [place], [place] Phone Number: 05/24/2023, 9:08 AM  Clinical Narrative:     CSW called [facility] yesterday around 11:02 AM for pick up, CSW unaware of them not having convalescent trucks. CSW reached out to [employer] who stated that they can pick patient up today around 3:00PM. Family made aware and facility. Nurse updated. New Med necessity form completed. Bed day completed. TOC signing off.   Expected Discharge Plan: Skilled Nursing Facility Barriers to Discharge: Barriers Resolved  Expected Discharge Plan and Services       Living arrangements for the past 2 months: Single Family Home Expected Discharge Date: 05/23/23               DME Arranged: N/A DME Agency: NA                   Social Determinants of Health (SDOH) Interventions SDOH Screenings   Food Insecurity: No Food Insecurity (05/20/2023)  Housing: Low Risk  (05/20/2023)  Transportation Needs: No Transportation Needs (05/20/2023)  Utilities: Not At Risk (05/20/2023)  Depression (PHQ2-9): Low Risk  (08/05/2022)  Financial Resource Strain: Medium Risk (08/10/2020)   Received from [facility], [facility]  Social Connections: Moderately Isolated (05/23/2023)  Tobacco Use: Medium Risk (05/04/2023)    Readmission Risk Interventions     No data to display

## 2023-05-24 NOTE — Progress Notes (Signed)
 PROGRESS NOTE  Valerie Griffin, is a 89 y.o. female, DOB - 08/01/1933, FMW:[id number]  Admit date - 05/20/2023   Admitting Physician Valerie Topor Pearlean, MD  Outpatient Primary MD for the patient is Valerie Leta NOVAK, MD  LOS - 0  Chief Complaint  Patient presents with   Fall      Brief Narrative:  89 y.o. female with past medical history relevant for CKD 3B, chronic constipation and chronic anemia with prior history of GI bleed presents to the ED after falling in her bathroom at home on 05/20/2023 --admitted 05/20/2023 with multiple left-sided rib cage fractures and ambulatory dysfunction in the setting of severe left-sided rib cage pain --   Addendum  1)Patient seen and evaluated, chart reviewed, please see EMR for updated orders. Please see full discharge summary dated 05/23/2023 dictated by Valerie Pearlean, MD   -As of 05/24/2023 patient remains medically stable for discharge to SNF rehab   -Please see full discharge summary dated 05/23/2023  -Assessment and Plan: 1)Multiple Closed Lt rib fractures--status post fall with multiple closed fractures involving the left seventh, eighth, ninth and tenth ribs --fall at home prior to admission Left-sided rib cage discomfort with positional change, deep inspiration and transfers -Requiring assistance to get out of bed to chair--due to left-sided rib cage pain -Patient required pain control with IV fentanyl , p.o. oxycodone  and methocarbamol  -Avoid NSAIDs due to CKD and history of GI bleed -Okay to discharge on p.o. oxycodone  and methocarbamol    2)CKD stage -3B    creatinine on admission= 1.26 (same as on 04/01/2023) -Creatinine down to 1.0 renally adjust medications, avoid nephrotoxic agents / dehydration  / hypotension   3)Chronic Anemia--suspect some degree of anemia of CKD, superimposed on history of iron  deficiency anemia -Continue iron  supplementation -Hgb currently 11.9 which is higher than prior baseline   4)Chronic Constipation--may  worsen with opiates and muscle relaxants -Senokot-S and MiraLAX  as ordered   5)Ambulatory Dysfunction----Significant left-sided rib cage discomfort with positional change, deep inspiration and transfers -Required significant assistance to get out of bed to chair--due to left-sided rib cage pain -Physical therapy eval appreciated recommends SNF rehab Recurrent falls---PTA pt lived at home  and did very poorly, patient has significant limitations with mobility related ADLs -Status to SNF rehab  Disposition: The patient is from: Home              Anticipated d/c is to: SNF           Code Status :  -  Code Status: Full Code   Family Communication:    (patient is alert, awake and coherent)  Discussed with daughter Valerie Griffin is primary contact  DVT Prophylaxis  :   - SCDs  heparin  injection 5,000 Units Start: 05/21/23 0600 SCDs Start: 05/20/23 1231 Place TED hose Start: 05/20/23 1231   Lab Results  Component Value Date   PLT 164 05/20/2023   Inpatient Medications  Scheduled Meds:  vitamin C   1,000 mg Oral Daily   cholecalciferol   2,000 Units Oral Daily   dextromethorphan -guaiFENesin   1 tablet Oral BID   ferrous sulfate   325 mg Oral Q breakfast   gabapentin   300 mg Oral BID   heparin   5,000 Units Subcutaneous Q8H   multivitamin with minerals  1 tablet Oral Daily   pantoprazole   40 mg Oral BID   polyethylene glycol  17 g Oral Daily   sodium chloride  flush  3 mL Intravenous Q12H   sodium chloride  flush  3 mL Intravenous Q12H   Continuous  Infusions: PRN Meds:.acetaminophen  **OR** acetaminophen , bisacodyl , fentaNYL  (SUBLIMAZE ) injection, ondansetron  **OR** ondansetron  (ZOFRAN ) IV, mouth rinse, oxyCODONE , sodium chloride  flush, traZODone    Anti-infectives (From admission, onward)    None      Subjective: [name] today has no fevers, no emesis,  No chest pain,  -Pain control  improving -Able to use incentive spirometry -Addendum  1)Patient seen and evaluated, chart  reviewed, please see EMR for updated orders. Please see full discharge summary dated 05/23/2023 dictated by Valerie Carwin, MD   -As of 05/24/2023 patient remains medically stable for discharge to SNF rehab   -Please see full discharge summary dated 05/23/2023  Objective: Vitals:   05/23/23 0808 05/23/23 2120 05/23/23 2124 05/24/23 0546  BP: 139/72 (!) 148/64  134/67  Pulse: 87 86  77  Resp: 19 18  19   Temp:  98.6 F (37 C)  98.4 F (36.9 C)  TempSrc:  Oral  Oral  SpO2: 96% (!) 89% 93% 90%  Weight:      Height:        Intake/Output Summary (Last 24 hours) at 05/24/2023 1018 Last data filed at 05/24/2023 0944 Gross per 24 hour  Intake 468 ml  Output 400 ml  Net 68 ml   Filed Weights   05/20/23 0851  Weight: 80 kg   Physical Exam  Gen:- Awake Alert,  in no apparent distress  HEENT:- [place].AT, No sclera icterus Neck-Supple Neck,No JVD,.  Lungs-  CTAB , fair symmetrical air movement, left-sided chest wall/rib cage tenderness with positional change and palpation CV- S1, S2 normal, regular  Abd-  +ve B.Sounds, Abd Soft, No tenderness,    Extremity/Skin:- No  edema, pedal pulses present  Psych-affect is appropriate, oriented x3 Neuro-Generalized weakness, no new focal deficits, no tremors  Data Reviewed: I have personally reviewed following labs and imaging studies  CBC: Recent Labs  Lab 05/20/23 1014  WBC 6.8  HGB 11.9*  HCT 38.0  MCV 92.7  PLT 164   Basic Metabolic Panel: Recent Labs  Lab 05/20/23 1014 05/22/23 0439  NA 138 133*  K 3.8 3.8  CL 102 100  CO2 28 25  GLUCOSE 130* 90  BUN 17 16  CREATININE 1.26* 1.05*  CALCIUM 10.0 8.8*   GFR: Estimated Creatinine Clearance: 39.4 mL/min (A) (by C-G formula based on SCr of 1.05 mg/dL (H)).  Radiology Studies: No results found.  Scheduled Meds:  vitamin C   1,000 mg Oral Daily   cholecalciferol   2,000 Units Oral Daily   dextromethorphan -guaiFENesin   1 tablet Oral BID   ferrous sulfate   325 mg Oral Q  breakfast   gabapentin   300 mg Oral BID   heparin   5,000 Units Subcutaneous Q8H   multivitamin with minerals  1 tablet Oral Daily   pantoprazole   40 mg Oral BID   polyethylene glycol  17 g Oral Daily   sodium chloride  flush  3 mL Intravenous Q12H   sodium chloride  flush  3 mL Intravenous Q12H   Continuous Infusions:   LOS: 0 days   Valerie Griffin M.D on 05/24/2023 at 10:18 AM  Go to [email or web address] - for contact info  [facility] - Office  [phone]  If 7PM-7AM, please contact night-coverage [email or web address] 05/24/2023, 10:18 AM

## 2023-05-24 NOTE — Progress Notes (Signed)
 Pt discharged via stretcher to [facility] via [place] ambulance service. Pt's daughter updated by phone that pt is leaving hospital for SNF.

## 2023-05-24 NOTE — Progress Notes (Signed)
 Pt awake, A&O this am. Pt very reluctant to move/turn/reposition. States, I'm too weak. I have anemia and I don't walk. It hurts too bad to move in my left back. Educated pt on importance of moving and mobility to prevent skin breakdown and pneumonia, and to decrease pain associated with mobility. Pt states, I know but I just can't honey. I am using my breathing thing. (pointing to the IS). Pt able to use IS to 800 ml mark with fair effort. Did get pt to assist me to turn her to her right side so I could assess her back and sacral area.  Pt states no BM in 3 days, states this is normal for her. Pt states she has stopped taking the miralax  because she doesn't want to have to get OOB to use the bathroom. Advised pt that she needs miralax  to prevent constipation and again, she needs to be OOB for reasons discussed previously. Pt states, I know honey but I just can't do it. I keep telling y'all that.  Pt states that she is leaving hospital today to go to rehab facility.

## 2023-05-24 NOTE — Progress Notes (Signed)
  Brief Narrative:  89 y.o. female with past medical history relevant for CKD 3B, chronic constipation and chronic anemia with prior history of GI bleed presents to the ED after falling in her bathroom at home on 05/20/2023 --admitted 05/20/2023 with multiple left-sided rib cage fractures and ambulatory dysfunction in the setting of severe left-sided rib cage pain  A/p 1)Patient seen and evaluated, chart reviewed, please see EMR for updated orders. Please see full discharge summary dated 05/23/2023 dictated by Rendall Carwin, MD  -As of 05/24/2023 patient remains medically stable for discharge to SNF rehab  -Please see full discharge summary dated 05/23/2023  Rendall Carwin, MD

## 2023-05-26 ENCOUNTER — Encounter (INDEPENDENT_AMBULATORY_CARE_PROVIDER_SITE_OTHER): Payer: Self-pay | Admitting: *Deleted

## 2023-06-05 ENCOUNTER — Telehealth (INDEPENDENT_AMBULATORY_CARE_PROVIDER_SITE_OTHER): Payer: Self-pay | Admitting: Gastroenterology

## 2023-06-05 NOTE — Telephone Encounter (Signed)
 Pt contacted to schedule repeat upper EGD.  Spoke with son and pt is currently in rehab. Unsure of when she will be getting out. Asked son to give [place] a call to schedule if/when pt gets out of rehab.   ASA 3 [place]

## 2023-06-17 NOTE — Progress Notes (Deleted)
 [facility] [address], [place] [address]   CLINIC:  Medical Oncology/Hematology  PCP:  Ignatius Specking, MD [address] [place] [address] [phone]   REASON FOR VISIT:  Follow-up for severe normocytic anemia  CURRENT THERAPY: Oral iron, IV iron, intermittent PRBC transfusions  INTERVAL HISTORY:   Ms. Valerie Griffin 89 y.o. female returns for routine follow-up of her severe normocytic anemia.  She was last seen by Rojelio Brenner PA-C on 04/10/2023, and received IV Feraheme x 3 in November/December 2024.  Since last visit, she was hospitalized from 05/20/2023 through 05/23/2023 due to fall resulting in multiple rib fractures.  She was discharged to SNF.  ***  At today's visit, she reports feeling ***. *** She does continue to have residual fatigue, dyspnea on exertion, and lightheadedness with standing. *** She denies any chest pain, pica, or syncope. *** She has occasional scant rectal bleeding with bowel movements, but denies any melena or gross hematochezia. ***She has been taking oral iron therapy for many years - HELD after last visit while on triple therapy for H. pylori, due to interaction with tetracycline ***  She has 75***% energy and 75***% appetite. She endorses that she is maintaining a stable weight.  ASSESSMENT & PLAN:  1.  Severe normocytic anemia: - Patient seen at the request of Dr. Irene Limbo (hospitalist) after hospitalization for from 07/20/2022 - 07/23/2022 for acute blood loss anemia secondary to gastrointestinal hemorrhage (presumed upper GI bleed with melena) Critical anemia on 07/20/2022 with Hgb 5.2, s/p 2 units PRBC EGD (07/21/2022) noncritical peptic stricture, large hiatal hernia.  No bleeding lesion in upper GI tract. Small bowel Givens capsule study (07/22/2022): Melena noted, but no obvious upper or mid bowel source of melena. 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 Angelene Giovanni, received Feraheme on 06/04/2020 and 06/15/2020.  Previously received PRBC x 2 at [facility] - Hospitalized 03/31/2023 - 04/02/2023 with Hgb 4.6, improved to Hgb 8.4 s/p 3 units PRBC.   Small bowel enteroscopy (04/01/2023) revealed esophageal ulcer, 9 cm hiatal hernia, erosive gastropathy, and nonbleeding gastric ulcers; no active bleeding or stigmata of recent bleeding noted.   - Hematology workup (08/05/2022): Low haptoglobin <10, DAT IgG positive (complement negative).  LDH mildly elevated 218.  (Bilirubin not checked) - consider that these were likely false positives after recent blood transfusions, especially since repeat hemolysis labs were NEGATIVE  Reticulocytes 1.4% Ferritin 20, iron saturation 23%, TIBC 459.  Elevated B12 1040, normal MMA.  Normal folate and copper. SPEP and immunofixation negative.  Kappa light chains elevated at 40.1, with normal lambda 26.0, normal FLC ratio 1.54 (consistent with CKD) Prior labs showed CKD stage IIIa/b - She has been taking oral iron therapy for many years (currently taking ferrous sulfate twice daily) - HELD after last visit while on triple therapy for H. pylori, due to interaction with tetracycline *** - Most recent IV iron with Feraheme x 3 in November/December 2024 -- She denies any rectal bleeding or melena*** - Labs today (06/19/2023): ***.  (Prior labs have shown normal B12/folate as well as baseline CKD stage IIIa/b).   - PLAN: *** TBD *** recommend Feraheme x 3 (patient is hesitant due to many other appointments for herself and her son, but ultimately agrees to additional IV iron if it is necessary) - Patient advised to STOP oral iron until after she has completed triple therapy (due  to interaction with tetracycline) -- Repeat CBC/D, BB sample, and iron panel in 8 weeks - If Hgb remains <10.0 despite optimization of iron, would recommend starting ESA. - Continue GI follow-up   2.  Pulmonary  embolism: - Diagnosis in 07/2020.  Treated with 6 months of Xarelto.  Unclear by history whether it was provoked or unprovoked.   3.  Social/family history: - She lives at home with her son.  Walks with the help of walker.  She is able to wash dishes, and occasionally cooks.  Prior to retirement, she did secretarial work in an office.  Never smoker.  She is accompanied by her son today. - No family history of anemia.  Daughter had anal cancer.  Father had prostate cancer.  Great grandfather had cancer.  Son died of stomach cancer.   PLAN SUMMARY: >> ***     REVIEW OF SYSTEMS: ***  Review of Systems  Constitutional:  Positive for fatigue. Negative for appetite change, chills, diaphoresis, fever and unexpected weight change.  HENT:   Negative for lump/mass and nosebleeds.   Eyes:  Negative for eye problems.  Respiratory:  Positive for shortness of breath (with exertion). Negative for cough and hemoptysis.   Cardiovascular:  Negative for chest pain, leg swelling and palpitations.  Gastrointestinal:  Positive for diarrhea. Negative for abdominal pain, blood in stool, constipation, nausea and vomiting.  Genitourinary:  Negative for hematuria.   Musculoskeletal:  Positive for gait problem (difficulty walking due to knees).  Skin: Negative.   Neurological:  Positive for dizziness, gait problem (difficulty walking due to knees), headaches, light-headedness and numbness.  Hematological:  Does not bruise/bleed easily.  Psychiatric/Behavioral:  Positive for sleep disturbance.      PHYSICAL EXAM:  ECOG PERFORMANCE STATUS: 2 - Symptomatic, <50% confined to bed *** There were no vitals filed for this visit.  There were no vitals filed for this visit.  Physical Exam Constitutional:      Appearance: Normal appearance. She is obese.  Cardiovascular:     Heart sounds: Normal heart sounds.  Pulmonary:     Breath sounds: Normal breath sounds.  Skin:    Coloration: Skin is pale.  Neurological:      General: No focal deficit present.     Mental Status: Mental status is at baseline.  Psychiatric:        Behavior: Behavior normal. Behavior is cooperative.    PAST MEDICAL/SURGICAL HISTORY:  Past Medical History:  Diagnosis Date  . Anemia   . Arthritis   . Constipation   . Dysphagia 08/16/2016  . Esophageal stenosis   . GERD (gastroesophageal reflux disease)    Past Surgical History:  Procedure Laterality Date  . ABDOMINAL HYSTERECTOMY    . APPENDECTOMY    . BALLOON DILATION N/A 06/11/2020   Procedure: BALLOON DILATION;  Surgeon: Malissa Hippo, MD;  [facility];  Service: Endoscopy;  Laterality: N/A;  . BIOPSY  04/01/2023   Procedure: BIOPSY;  Surgeon: Marguerita Merles, Reuel Boom, MD;  [facility];  Service: Gastroenterology;;  . broken arm and wrist with plate and scews rt wrist Right   . CHOLECYSTECTOMY    . COLONOSCOPY    . COLONOSCOPY WITH PROPOFOL N/A 07/23/2022   Procedure: COLONOSCOPY WITH PROPOFOL;  Surgeon: Dolores Frame, MD;  [facility];  Service: Gastroenterology;  Laterality: N/A;  . complete hysterectomy'     fibroid tumors bleeding  . ENTEROSCOPY N/A 04/01/2023   Procedure: ENTEROSCOPY;  Surgeon: Marguerita Merles,  Reuel Boom, MD;  [facility];  Service: Gastroenterology;  Laterality: N/A;  . ESOPHAGEAL DILATION N/A 08/22/2016   Procedure: ESOPHAGEAL DILATION;  Surgeon: Malissa Hippo, MD;  [facility];  Service: Endoscopy;  Laterality: N/A;  . ESOPHAGEAL DILATION N/A 09/14/2016   Procedure: ESOPHAGEAL DILATION;  Surgeon: Malissa Hippo, MD;  [facility];  Service: Endoscopy;  Laterality: N/A;  . ESOPHAGEAL DILATION N/A 07/26/2017   Procedure: ESOPHAGEAL DILATION;  Surgeon: Malissa Hippo, MD;  [facility];  Service: Endoscopy;  Laterality: N/A;  . ESOPHAGEAL DILATION N/A 08/31/2017   Procedure: ESOPHAGEAL DILATION;  Surgeon: Malissa Hippo, MD;  [facility];  Service: Endoscopy;  Laterality: N/A;  . ESOPHAGEAL DILATION N/A 10/26/2017   Procedure: ESOPHAGEAL DILATION;  Surgeon: Malissa Hippo, MD;  [facility];  Service: Endoscopy;  Laterality: N/A;  . ESOPHAGEAL DILATION N/A 02/07/2018   Procedure: ESOPHAGEAL DILATION;  Surgeon: Malissa Hippo, MD;  [facility];  Service: Endoscopy;  Laterality: N/A;  . ESOPHAGEAL DILATION N/A 05/31/2019   Procedure: ESOPHAGEAL DILATION;  Surgeon: Malissa Hippo, MD;  [facility];  Service: Endoscopy;  Laterality: N/A;  . ESOPHAGEAL DILATION N/A 06/14/2019   Procedure: ESOPHAGEAL DILATION;  Surgeon: Malissa Hippo, MD;  [facility];  Service: Endoscopy;  Laterality: N/A;  . ESOPHAGEAL DILATION N/A 07/04/2019   Procedure: ESOPHAGEAL DILATION;  Surgeon: Malissa Hippo, MD;  [facility];  Service: Endoscopy;  Laterality: N/A;  730  . ESOPHAGEAL DILATION N/A 08/01/2019   Procedure: ESOPHAGEAL DILATION;  Surgeon: Malissa Hippo, MD;  [facility];  Service: Endoscopy;  Laterality: N/A;  . ESOPHAGEAL DILATION N/A 05/14/2020   Procedure: ESOPHAGEAL DILATION;  Surgeon: Malissa Hippo, MD;  [facility];  Service: Endoscopy;  Laterality: N/A;  . ESOPHAGOGASTRODUODENOSCOPY N/A 08/22/2016   Procedure: ESOPHAGOGASTRODUODENOSCOPY (EGD);  Surgeon: Malissa Hippo, MD;  [facility];  Service: Endoscopy;  Laterality: N/A;  . ESOPHAGOGASTRODUODENOSCOPY N/A 09/14/2016   Procedure: ESOPHAGOGASTRODUODENOSCOPY (EGD);  Surgeon: Malissa Hippo, MD;  [facility];  Service: Endoscopy;  Laterality: N/A;  1225  . ESOPHAGOGASTRODUODENOSCOPY N/A 07/26/2017   Procedure: ESOPHAGOGASTRODUODENOSCOPY (EGD);  Surgeon: Malissa Hippo, MD;  [facility];  Service: Endoscopy;  Laterality: N/A;  1:45  . ESOPHAGOGASTRODUODENOSCOPY N/A 08/31/2017   Procedure: ESOPHAGOGASTRODUODENOSCOPY (EGD);  Surgeon: Malissa Hippo, MD;   [facility];  Service: Endoscopy;  Laterality: N/A;  . ESOPHAGOGASTRODUODENOSCOPY N/A 10/26/2017   Procedure: ESOPHAGOGASTRODUODENOSCOPY (EGD);  Surgeon: Malissa Hippo, MD;  [facility];  Service: Endoscopy;  Laterality: N/A;  1115  . ESOPHAGOGASTRODUODENOSCOPY N/A 02/07/2018   Procedure: ESOPHAGOGASTRODUODENOSCOPY (EGD);  Surgeon: Malissa Hippo, MD;  [facility];  Service: Endoscopy;  Laterality: N/A;  830  . ESOPHAGOGASTRODUODENOSCOPY N/A 07/04/2019   Procedure: ESOPHAGOGASTRODUODENOSCOPY (EGD);  Surgeon: Malissa Hippo, MD;  [facility];  Service: Endoscopy;  Laterality: N/A;  730  . ESOPHAGOGASTRODUODENOSCOPY N/A 08/01/2019   Procedure: ESOPHAGOGASTRODUODENOSCOPY (EGD);  Surgeon: Malissa Hippo, MD;  [facility];  Service: Endoscopy;  Laterality: N/A;  125  . ESOPHAGOGASTRODUODENOSCOPY N/A 05/14/2020   Procedure: ESOPHAGOGASTRODUODENOSCOPY (EGD);  Surgeon: Malissa Hippo, MD;  [facility];  Service: Endoscopy;  Laterality: N/A;  10:30  . ESOPHAGOGASTRODUODENOSCOPY N/A 06/11/2020   Procedure: ESOPHAGOGASTRODUODENOSCOPY (EGD);  Surgeon: Malissa Hippo, MD;  [facility];  Service: Endoscopy;  Laterality: N/A;  7:30  . ESOPHAGOGASTRODUODENOSCOPY (EGD) WITH PROPOFOL N/A 05/31/2019   Procedure: ESOPHAGOGASTRODUODENOSCOPY (EGD) WITH PROPOFOL;  Surgeon: Malissa Hippo, MD;  [facility];  Service: Endoscopy;  Laterality: N/A;  2:35  . ESOPHAGOGASTRODUODENOSCOPY (EGD) WITH PROPOFOL N/A 06/14/2019   Procedure: ESOPHAGOGASTRODUODENOSCOPY (EGD) WITH PROPOFOL;  Surgeon: Malissa Hippo, MD;  [facility];  Service: Endoscopy;  Laterality: N/A;  . ESOPHAGOGASTRODUODENOSCOPY (EGD) WITH PROPOFOL N/A 07/21/2022   Procedure: ESOPHAGOGASTRODUODENOSCOPY (EGD) WITH PROPOFOL;  Surgeon: Corbin Ade, MD;  [facility];  Service: Endoscopy;  Laterality: N/A;  . FOREIGN BODY REMOVAL  05/31/2019    Procedure: FOREIGN BODY REMOVAL;  Surgeon: Malissa Hippo, MD;  [facility];  Service: Endoscopy;;  . GIVENS CAPSULE STUDY N/A 07/21/2022   Procedure: GIVENS CAPSULE STUDY;  Surgeon: Corbin Ade, MD;  [facility];  Service: Endoscopy;  Laterality: N/A;  . POLYPECTOMY  07/23/2022   Procedure: POLYPECTOMY;  Surgeon: Dolores Frame, MD;  [facility];  Service: Gastroenterology;;    SOCIAL HISTORY:  Social History   Socioeconomic History  . Marital status: Widowed    Spouse name: Not on file  . Number of children: Not on file  . Years of education: Not on file  . Highest education level: Not on file  Occupational History  . Not on file  Tobacco Use  . Smoking status: Never    Passive exposure: Current  . Smokeless tobacco: Never  Vaping Use  . Vaping status: Never Used  Substance and Sexual Activity  . Alcohol use: No  . Drug use: No  . Sexual activity: Not Currently  Other Topics Concern  . Not on file  Social History Narrative  . Not on file   Social Drivers of Health   Financial Resource Strain: Medium Risk (08/10/2020)   Received [facility]   Overall Financial Resource Strain (CARDIA)   . Difficulty of Paying Living Expenses: Somewhat hard  Food Insecurity: No Food Insecurity (05/20/2023)   Hunger Vital Sign   . Worried About Programme researcher, broadcasting/film/video in the Last Year: Never true   . Ran Out of Food in the Last Year: Never true  Transportation Needs: No Transportation Needs (06/12/2023)   [facility] from [facility]   OASIS A1250: Transportation   . Lack of Transportation (Medical): No   . Lack of Transportation (Non-Medical): No   . Patient Unable or Declines to Respond: No  Physical Activity: Not on file  Stress: Not on file  Social Connections: Moderately Isolated (05/23/2023)   Social Connection and Isolation Panel [NHANES]   . Frequency of Communication with Friends and Family: Three  times a week   . Frequency of Social Gatherings with Friends and Family: Three times a week   . Attends Religious Services: 1 to 4 times per year   . Active Member of Clubs or Organizations: No   . Attends Banker Meetings: Never   . Marital Status: Widowed  Intimate Partner Violence: Not At Risk (05/20/2023)   Humiliation, Afraid, Rape, and Kick questionnaire   . Fear of Current or Ex-Partner: No   . Emotionally Abused: No   . Physically Abused: No   . Sexually Abused: No    FAMILY HISTORY:  Family History  Problem Relation Age of Onset  . Heart Problems Mother   . Alzheimer's disease Mother   .  Hypertension Mother   . Prostate cancer Father   . Heart attack Father   . Heart Problems Sister     CURRENT MEDICATIONS:  Outpatient Encounter Medications as of 06/19/2023  Medication Sig  . acetaminophen (TYLENOL) 325 MG tablet Take 2 tablets (650 mg total) by mouth every 6 (six) hours as needed for mild pain (pain score 1-3) (or Fever >/= 101).  . gabapentin (NEURONTIN) 300 MG capsule Take 1 capsule (300 mg total) by mouth 3 (three) times daily. 2 capsules at bedtime and 2 more during the day as needed  . Iron, Ferrous Sulfate, 325 (65 Fe) MG TABS Take 325 mg by mouth daily with breakfast.  . methocarbamol (ROBAXIN) 500 MG tablet Take 1 tablet (500 mg total) by mouth every 8 (eight) hours as needed for muscle spasms.  . Multiple Vitamin (MULTIVITAMIN WITH MINERALS) TABS tablet Take 1 tablet by mouth daily. Centrum Silver  . oxyCODONE (OXY IR/ROXICODONE) 5 MG immediate release tablet Take 1 tablet (5 mg total) by mouth every 6 (six) hours as needed for moderate pain (pain score 4-6).  . pantoprazole (PROTONIX) 40 MG tablet Take 1 tablet (40 mg total) by mouth 2 (two) times daily.  . polyethylene glycol (MIRALAX / GLYCOLAX) 17 g packet Take 17 g by mouth daily.  Marland Kitchen senna-docusate (SENOKOT-S) 8.6-50 MG tablet Take 2 tablets by mouth at bedtime.   No facility-administered  encounter medications on file as of 06/19/2023.    ALLERGIES:  Allergies  Allergen Reactions  . Penicillins Rash and Other (See Comments)    40 years ago, caused rash and tachycardia     LABORATORY DATA:  I have reviewed the labs as listed.  CBC    Component Value Date/Time   WBC 6.8 05/20/2023 1014   RBC 4.10 05/20/2023 1014   HGB 11.9 (L) 05/20/2023 1014   HCT 38.0 05/20/2023 1014   PLT 164 05/20/2023 1014   MCV 92.7 05/20/2023 1014   MCH 29.0 05/20/2023 1014   MCHC 31.3 05/20/2023 1014   RDW 22.9 (H) 05/20/2023 1014   LYMPHSABS 1.0 03/31/2023 1238   MONOABS 0.3 03/31/2023 1238   EOSABS 0.1 03/31/2023 1238   BASOSABS 0.0 03/31/2023 1238      Latest Ref Rng & Units 05/22/2023    4:39 AM 05/20/2023   10:14 AM 04/17/2023    5:49 PM  CMP  Glucose 70 - 99 mg/dL 90  098  119   BUN 8 - 23 mg/dL 16  17  18    Creatinine 0.44 - 1.00 mg/dL 1.47  8.29  5.62   Sodium 135 - 145 mmol/L 133  138  137   Potassium 3.5 - 5.1 mmol/L 3.8  3.8  5.0   Chloride 98 - 111 mmol/L 100  102  109   CO2 22 - 32 mmol/L 25  28  22    Calcium 8.9 - 10.3 mg/dL 8.8  13.0  8.4     DIAGNOSTIC IMAGING:  I have independently reviewed the relevant imaging and discussed with the patient.   WRAP UP:  All questions were answered. The patient knows to call the clinic with any problems, questions or concerns.  Medical decision making: Moderate***  Time spent on visit: I spent 20 minutes counseling the patient face to face. The total time spent in the appointment was 30 minutes and more than 50% was on counseling.  Carnella Guadalajara, PA-C  ***

## 2023-06-19 ENCOUNTER — Inpatient Hospital Stay: Payer: Medicare Other | Admitting: Physician Assistant

## 2023-06-19 ENCOUNTER — Inpatient Hospital Stay: Payer: Medicare Other

## 2023-06-29 ENCOUNTER — Emergency Department (HOSPITAL_COMMUNITY): Payer: Medicare Other

## 2023-06-29 ENCOUNTER — Emergency Department (HOSPITAL_COMMUNITY)
Admission: EM | Admit: 2023-06-29 | Discharge: 2023-06-29 | Disposition: A | Discharge status: Home / Self Care | Payer: Medicare Other | Attending: Emergency Medicine | Admitting: Emergency Medicine

## 2023-06-29 ENCOUNTER — Encounter (HOSPITAL_COMMUNITY): Payer: Self-pay

## 2023-06-29 DIAGNOSIS — R600 Localized edema: Secondary | ICD-10-CM | POA: Insufficient documentation

## 2023-06-29 DIAGNOSIS — R479 Unspecified speech disturbances: Secondary | ICD-10-CM | POA: Insufficient documentation

## 2023-06-29 DIAGNOSIS — S0990XA Unspecified injury of head, initial encounter: Secondary | ICD-10-CM

## 2023-06-29 DIAGNOSIS — R079 Chest pain, unspecified: Secondary | ICD-10-CM | POA: Diagnosis not present

## 2023-06-29 DIAGNOSIS — R197 Diarrhea, unspecified: Secondary | ICD-10-CM | POA: Insufficient documentation

## 2023-06-29 DIAGNOSIS — W01198A Fall on same level from slipping, tripping and stumbling with subsequent striking against other object, initial encounter: Secondary | ICD-10-CM | POA: Diagnosis not present

## 2023-06-29 DIAGNOSIS — S0083XA Contusion of other part of head, initial encounter: Secondary | ICD-10-CM | POA: Diagnosis not present

## 2023-06-29 DIAGNOSIS — W19XXXA Unspecified fall, initial encounter: Secondary | ICD-10-CM

## 2023-06-29 LAB — COMPREHENSIVE METABOLIC PANEL
ALT: 19 U/L (ref 0–44)
AST: 30 U/L (ref 15–41)
Albumin: 3.9 g/dL (ref 3.5–5.0)
Alkaline Phosphatase: 86 U/L (ref 38–126)
Anion gap: 12 (ref 5–15)
BUN: 16 mg/dL (ref 8–23)
CO2: 22 mmol/L (ref 22–32)
Calcium: 9.6 mg/dL (ref 8.9–10.3)
Chloride: 107 mmol/L (ref 98–111)
Creatinine, Ser: 0.95 mg/dL (ref 0.44–1.00)
GFR, Estimated: 58 mL/min — ABNORMAL LOW (ref >60)
Glucose, Bld: 107 mg/dL — ABNORMAL HIGH (ref 70–99)
Potassium: 3.8 mmol/L (ref 3.5–5.1)
Sodium: 141 mmol/L (ref 135–145)
Total Bilirubin: 0.8 mg/dL (ref 0.0–1.2)
Total Protein: 7.2 g/dL (ref 6.5–8.1)

## 2023-06-29 LAB — CBC WITH DIFFERENTIAL/PLATELET
Abs Immature Granulocytes: 0.02 10*3/uL (ref 0.00–0.07)
Basophils Absolute: 0 10*3/uL (ref 0.0–0.1)
Basophils Relative: 0 %
Eosinophils Absolute: 0.2 10*3/uL (ref 0.0–0.5)
Eosinophils Relative: 4 %
HCT: 40.5 % (ref 36.0–46.0)
Hemoglobin: 13.1 g/dL (ref 12.0–15.0)
Immature Granulocytes: 0 %
Lymphocytes Relative: 24 %
Lymphs Abs: 1.2 10*3/uL (ref 0.7–4.0)
MCH: 31.4 pg (ref 26.0–34.0)
MCHC: 32.3 g/dL (ref 30.0–36.0)
MCV: 97.1 fL (ref 80.0–100.0)
Monocytes Absolute: 0.5 10*3/uL (ref 0.1–1.0)
Monocytes Relative: 9 %
Neutro Abs: 3.3 10*3/uL (ref 1.7–7.7)
Neutrophils Relative %: 63 %
Platelets: 211 10*3/uL (ref 150–400)
RBC: 4.17 MIL/uL (ref 3.87–5.11)
RDW: 15.9 % — ABNORMAL HIGH (ref 11.5–15.5)
WBC: 5.2 10*3/uL (ref 4.0–10.5)
nRBC: 0 % (ref 0.0–0.2)

## 2023-06-29 MED ORDER — IOHEXOL 350 MG/ML SOLN
75.0000 mL | Freq: Once | INTRAVENOUS | Status: AC | PRN
  Administered 2023-06-29: 75 mL via INTRAVENOUS

## 2023-06-29 NOTE — ED Notes (Signed)
 Pt back from MRI

## 2023-06-29 NOTE — Discharge Instructions (Addendum)
 Follow-up with your doctor for the difficulty speaking that you have been having.  There was a nodule on the CT scan that will need a repeat scan in 6 to 12 months.

## 2023-06-29 NOTE — ED Notes (Signed)
 Patient transported to MRI

## 2023-06-29 NOTE — ED Notes (Signed)
 UN not obtained.  Pt brief was soiled

## 2023-06-29 NOTE — ED Triage Notes (Signed)
 Pt arrived via POV from home c/o injury to posterior scalp. Per Pts son, he was trying to help the Pt upstairs in the wheelchair, and his leg gave out and they both fell on the concrete. Pt presents with bruising and swelling to her head. Pt denies LOC. Per Pt and her son, the Pt has been having difficulty forming verbal sentences X 2 days.

## 2023-06-29 NOTE — ED Provider Notes (Signed)
 [place] [facility] AT [facility] Provider Note   CSN: [id number] Arrival date & time: 06/29/23  1453     History  Chief Complaint  Patient presents with   [place]    Valerie Griffin is a 89 y.o. female.   Fall  Presents after fall.  Lost her balance and fell backwards in the wheelchair.  Hitting back of her head.  However has not been doing well overall at home.  The fall was today but for the last 3 days has had being some difficulty speaking.  Reportedly difficulty completing sentences.  States that the words are right in her head but just will not come out.  Around 2 weeks ago was discharged from nursing home after being there after fall with rib fractures.  Is on only Neurontin  at this time.  No more opiates or muscle relaxers.  No dysuria.  Still having pain in her left chest. Patient had diarrhea while she was in rehab.  However reported stopped after she left and then started up again a few days ago.     Home Medications Prior to Admission medications   Medication Sig Start Date End Date Taking? Authorizing Provider  acetaminophen  (TYLENOL ) 325 MG tablet Take 2 tablets (650 mg total) by mouth every 6 (six) hours as needed for mild pain (pain score 1-3) (or Fever >/= 101). 05/23/23  Yes Pearlean Manus, MD  gabapentin  (NEURONTIN ) 300 MG capsule Take 1 capsule (300 mg total) by mouth 3 (three) times daily. 2 capsules at bedtime and 2 more during the day as needed 05/23/23  Yes Emokpae, Courage, MD  Iron , Ferrous Sulfate , 325 (65 Fe) MG TABS Take 325 mg by mouth daily with breakfast. 05/23/23  Yes Emokpae, Courage, MD  Multiple Vitamin (MULTIVITAMIN WITH MINERALS) TABS tablet Take 1 tablet by mouth daily. Centrum Silver   Yes [provider]  oxyCODONE  (OXY IR/ROXICODONE ) 5 MG immediate release tablet Take 1 tablet (5 mg total) by mouth every 6 (six) hours as needed for moderate pain (pain score 4-6). 05/23/23  Yes Pearlean Manus, MD       Allergies    Penicillins    Review of Systems   Review of Systems  Physical Exam Updated Vital Signs BP (!) 175/69   Pulse 86   Temp 98 F (36.7 C) (Oral)   Resp 19   Ht 5' 5 (1.651 m)   Wt 77.1 kg   SpO2 96%   BMI 28.29 kg/m  Physical Exam Vitals and nursing note reviewed.  HENT:     Head:     Comments: Right sided occipital hematoma. Eyes:     Pupils: Pupils are equal, round, and reactive to light.  Cardiovascular:     Rate and Rhythm: Normal rate and regular rhythm.  Abdominal:     Tenderness: There is no abdominal tenderness.  Musculoskeletal:     Right lower leg: Edema present.     Left lower leg: Edema present.  Neurological:     Mental Status: She is alert. Mental status is at baseline.     Comments: Appears to have normal speech at this time.     ED Results / Procedures / Treatments   Labs (all labs ordered are listed, but only abnormal results are displayed) Labs Reviewed  COMPREHENSIVE METABOLIC PANEL - Abnormal; Notable for the following components:      Result Value   Glucose, Bld 107 (*)    GFR, Estimated 58 (*)  All other components within normal limits  CBC WITH DIFFERENTIAL/PLATELET - Abnormal; Notable for the following components:   RDW 15.9 (*)    All other components within normal limits  URINALYSIS, W/ REFLEX TO CULTURE (INFECTION SUSPECTED)    EKG None  Radiology MR BRAIN WO CONTRAST Result Date: 06/29/2023 CLINICAL DATA:  Acute neurologic deficit.  Fall onto concrete. EXAM: MRI HEAD WITHOUT CONTRAST TECHNIQUE: Multiplanar, multiecho pulse sequences of the brain and surrounding structures were obtained without intravenous contrast. COMPARISON:  None Available. FINDINGS: Brain: No acute infarct, mass effect or extra-axial collection. No acute or chronic hemorrhage. There is multifocal hyperintense T2-weighted signal within the white matter. Parenchymal volume and CSF spaces are normal. The midline structures are normal. Vascular:  Normal flow voids. Skull and upper cervical spine: Normal calvarium and skull base. Visualized upper cervical spine and soft tissues are normal. Sinuses/Orbits:No paranasal sinus fluid levels or advanced mucosal thickening. No mastoid or middle ear effusion. Normal orbits. IMPRESSION: 1. No acute intracranial abnormality. 2. Findings of chronic small vessel ischemia. Electronically Signed   By: Franky Stanford M.D.   On: 06/29/2023 21:48   CT ANGIO HEAD NECK W WO CM Result Date: 06/29/2023 CLINICAL DATA:  Fall, unable to form sentences, intracranial arterial injury suspected EXAM: CT ANGIOGRAPHY HEAD AND NECK WITH AND WITHOUT CONTRAST TECHNIQUE: Multidetector CT imaging of the head and neck was performed using the standard protocol during bolus administration of intravenous contrast. Multiplanar CT image reconstructions and MIPs were obtained to evaluate the vascular anatomy. Carotid stenosis measurements (when applicable) are obtained utilizing NASCET criteria, using the distal internal carotid diameter as the denominator. RADIATION DOSE REDUCTION: This exam was performed according to the departmental dose-optimization program which includes automated exposure control, adjustment of the mA and/or kV according to patient size and/or use of iterative reconstruction technique. CONTRAST:  75mL OMNIPAQUE  IOHEXOL  350 MG/ML SOLN COMPARISON:  None Available. FINDINGS: CT HEAD FINDINGS Brain: No evidence of acute infarct, hemorrhage, mass, mass effect, or midline shift. No hydrocephalus or extra-axial fluid collection. Periventricular white matter changes, likely the sequela of chronic small vessel ischemic disease. Age related cerebral atrophy. Vascular: No hyperdense vessel. Atherosclerotic calcifications in the intracranial carotid and vertebral arteries. Skull: Negative for fracture or focal lesion. Hyperostosis frontalis. Sinuses/Orbits: No acute finding. Other: The mastoid air cells are well aerated. CTA NECK FINDINGS  Aortic arch: Standard branching. Imaged portion shows no evidence of aneurysm or dissection. No significant stenosis of the major arch vessel origins. Aortic atherosclerosis. Right carotid system: No evidence of dissection, occlusion, or hemodynamically significant stenosis (greater than 50%). Left carotid system: No evidence of dissection, occlusion, or hemodynamically significant stenosis (greater than 50%). Vertebral arteries: No evidence of dissection, occlusion, or hemodynamically significant stenosis (greater than 50%). Skeleton: No acute osseous abnormality. Degenerative changes in the cervical spine. Other neck: 7 mm hypodense lesion in the left thyroid lobe, for which no follow-up is indicated. (Reference: J Am Coll Radiol. 2015 Feb;12(2): 143-50). Upper chest: Subpleural nodular opacity along the posterior right upper lobe (series 11, image 282), which measures to 8 mm and was not present on the 11/21/2017 chest CT.). No pleural effusion. Apical pleuroparenchymal scarring. Review of the MIP images confirms the above findings CTA HEAD FINDINGS Anterior circulation: Both internal carotid arteries are patent to the termini, without significant stenosis. A1 segments patent. Normal anterior communicating artery. Anterior cerebral arteries are patent to their distal aspects without significant stenosis. No M1 stenosis or occlusion. MCA branches perfused to their distal aspects  without significant stenosis. Posterior circulation: Vertebral arteries patent to the vertebrobasilar junction without significant stenosis. Posterior inferior cerebellar arteries patent proximally. Basilar patent to its distal aspect without significant stenosis. Superior cerebellar arteries patent proximally. Patent P1 segments. PCAs perfused to their distal aspects without significant stenosis. The bilateral posterior communicating arteries are not visualized. Venous sinuses: As permitted by contrast timing, patent. Anatomic variants:  None significant. No evidence of aneurysm or vascular malformation. Review of the MIP images confirms the above findings IMPRESSION: 1. No acute intracranial process. 2. No intracranial large vessel occlusion or significant stenosis. 3. No hemodynamically significant stenosis in the neck. 4. Subpleural nodular opacity along the posterior right upper lobe, which measures to 8 mm and was not present on the 11/21/2017 chest CT. Non-contrast chest CT at 6-12 months is recommended. If the nodule is stable at time of repeat CT, then future CT at 18-24 months (from today's scan) is considered optional for low-risk patients, but is recommended for high-risk patients. This recommendation follows the consensus statement: Guidelines for Management of Incidental Pulmonary Nodules Detected on CT Images: From the Fleischner Society 2017; Radiology 2017; 284:228-243. 5. Aortic atherosclerosis. Aortic Atherosclerosis (ICD10-I70.0). Electronically Signed   By: Donald Campion M.D.   On: 06/29/2023 19:07    Procedures Procedures    Medications Ordered in ED Medications  iohexol  (OMNIPAQUE ) 350 MG/ML injection 75 mL (75 mLs Intravenous Contrast Given 06/29/23 1820)    ED Course/ Medical Decision Making/ A&P                                 Medical Decision Making Amount and/or Complexity of Data Reviewed Labs: ordered. Radiology: ordered.  Risk Prescription drug management.   Patient with fall and difficulty speaking.  Fall was mechanical.  Did hit head and will get CT scan evaluate.  Also has had some difficulty speaking.  Difficulty reportedly getting the words out.  Differential diagnoses long but includes causes such as intracranial hemorrhage and stroke.  This predated the fall.  Will get CTA to evaluate.  Reviewed recent discharge note.  CT scan reassuring.  Does show a lung nodule.  MRI done to rule out stroke.  It was reassuring.  No acute stroke.  Patient appears stable for discharge home.  Blood work  overall reassuring.        Final Clinical Impression(s) / ED Diagnoses Final diagnoses:  Fall, initial encounter  Injury of head, initial encounter  Difficulty speaking    Rx / DC Orders ED Discharge Orders     None         Patsey Lot, MD 06/29/23 2211

## 2023-06-29 NOTE — ED Notes (Signed)
 Pt able to articulate in clear sentences name, DOB, year. A&O x4, able to clearly articulate want for warm blanket, no slurring of words noted, no difficulty in finding words noted at this time when asking pt a question

## 2023-09-05 ENCOUNTER — Ambulatory Visit (INDEPENDENT_AMBULATORY_CARE_PROVIDER_SITE_OTHER): Payer: Medicare Other | Admitting: Gastroenterology

## 2023-09-21 ENCOUNTER — Ambulatory Visit (INDEPENDENT_AMBULATORY_CARE_PROVIDER_SITE_OTHER): Payer: Medicare Other | Admitting: Gastroenterology

## 2024-03-06 ENCOUNTER — Encounter (INDEPENDENT_AMBULATORY_CARE_PROVIDER_SITE_OTHER): Payer: Self-pay | Admitting: Gastroenterology
